# Patient Record
Sex: Male | Born: 1935 | Race: White | Hispanic: No | Marital: Married | Smoking: Former smoker
Health system: Southern US, Community
[De-identification: ages and names within clinical notes are randomized; demographics above are authoritative.]

## PROBLEM LIST (undated history)

## (undated) DIAGNOSIS — N400 Enlarged prostate without lower urinary tract symptoms: Secondary | ICD-10-CM

## (undated) DIAGNOSIS — I442 Atrioventricular block, complete: Secondary | ICD-10-CM

## (undated) DIAGNOSIS — E785 Hyperlipidemia, unspecified: Secondary | ICD-10-CM

## (undated) DIAGNOSIS — R972 Elevated prostate specific antigen [PSA]: Secondary | ICD-10-CM

## (undated) DIAGNOSIS — Z950 Presence of cardiac pacemaker: Secondary | ICD-10-CM

## (undated) DIAGNOSIS — I10 Essential (primary) hypertension: Secondary | ICD-10-CM

## (undated) DIAGNOSIS — I480 Paroxysmal atrial fibrillation: Secondary | ICD-10-CM

## (undated) DIAGNOSIS — J449 Chronic obstructive pulmonary disease, unspecified: Secondary | ICD-10-CM

## (undated) DIAGNOSIS — I509 Heart failure, unspecified: Secondary | ICD-10-CM

## (undated) DIAGNOSIS — I453 Trifascicular block: Secondary | ICD-10-CM

## (undated) HISTORY — DX: Trifascicular block: I45.3

## (undated) HISTORY — DX: Benign prostatic hyperplasia without lower urinary tract symptoms: N40.0

## (undated) HISTORY — DX: Paroxysmal atrial fibrillation: I48.0

## (undated) HISTORY — PX: CATARACT EXTRACTION, BILATERAL: SHX1313

## (undated) HISTORY — DX: Presence of cardiac pacemaker: Z95.0

## (undated) HISTORY — DX: Hyperlipidemia, unspecified: E78.5

## (undated) HISTORY — PX: TONSILLECTOMY: SUR1361

## (undated) HISTORY — DX: Atrioventricular block, complete: I44.2

## (undated) HISTORY — DX: Essential (primary) hypertension: I10

## (undated) HISTORY — DX: Elevated prostate specific antigen (PSA): R97.20

---

## 1998-07-31 ENCOUNTER — Encounter: Admission: RE | Admit: 1998-07-31 | Discharge: 1998-10-29 | Payer: Self-pay | Admitting: Internal Medicine

## 2004-04-20 ENCOUNTER — Ambulatory Visit: Payer: Self-pay | Admitting: Internal Medicine

## 2004-08-17 ENCOUNTER — Ambulatory Visit: Payer: Self-pay | Admitting: Internal Medicine

## 2004-08-27 ENCOUNTER — Ambulatory Visit: Payer: Self-pay

## 2004-11-19 ENCOUNTER — Ambulatory Visit: Payer: Self-pay | Admitting: Internal Medicine

## 2005-03-11 ENCOUNTER — Ambulatory Visit: Payer: Self-pay | Admitting: Internal Medicine

## 2005-04-29 ENCOUNTER — Ambulatory Visit: Payer: Self-pay | Admitting: Internal Medicine

## 2005-09-16 ENCOUNTER — Ambulatory Visit: Payer: Self-pay | Admitting: Internal Medicine

## 2005-09-23 ENCOUNTER — Ambulatory Visit: Payer: Self-pay | Admitting: Internal Medicine

## 2005-10-22 ENCOUNTER — Ambulatory Visit: Payer: Self-pay | Admitting: Gastroenterology

## 2006-01-27 ENCOUNTER — Ambulatory Visit: Payer: Self-pay | Admitting: Internal Medicine

## 2006-06-03 ENCOUNTER — Ambulatory Visit: Payer: Self-pay | Admitting: Internal Medicine

## 2006-10-02 ENCOUNTER — Ambulatory Visit: Payer: Self-pay | Admitting: Internal Medicine

## 2006-12-24 ENCOUNTER — Ambulatory Visit: Payer: Self-pay | Admitting: Internal Medicine

## 2006-12-24 DIAGNOSIS — I10 Essential (primary) hypertension: Secondary | ICD-10-CM

## 2006-12-24 DIAGNOSIS — Z87898 Personal history of other specified conditions: Secondary | ICD-10-CM

## 2006-12-24 DIAGNOSIS — H409 Unspecified glaucoma: Secondary | ICD-10-CM | POA: Insufficient documentation

## 2006-12-24 DIAGNOSIS — E0842 Diabetes mellitus due to underlying condition with diabetic polyneuropathy: Secondary | ICD-10-CM | POA: Insufficient documentation

## 2006-12-24 LAB — CONVERTED CEMR LAB
Basophils Relative: 0.5 % (ref 0.0–1.0)
Bilirubin, Direct: 0.1 mg/dL (ref 0.0–0.3)
CO2: 29 meq/L (ref 19–32)
Creatinine, Ser: 0.9 mg/dL (ref 0.4–1.5)
Eosinophils Relative: 5.1 % — ABNORMAL HIGH (ref 0.0–5.0)
GFR calc Af Amer: 105 mL/min
Glucose, Bld: 142 mg/dL — ABNORMAL HIGH (ref 70–99)
HCT: 42.6 % (ref 39.0–52.0)
HDL: 32.1 mg/dL — ABNORMAL LOW (ref >39.0)
Hemoglobin: 14.4 g/dL (ref 13.0–17.0)
Lymphocytes Relative: 33.9 % (ref 12.0–46.0)
Microalb Creat Ratio: 10.8 mg/g (ref 0.0–30.0)
Microalb, Ur: 2.9 mg/dL — ABNORMAL HIGH (ref 0.0–1.9)
Monocytes Absolute: 0.9 10*3/uL — ABNORMAL HIGH (ref 0.2–0.7)
Monocytes Relative: 9.4 % (ref 3.0–11.0)
Neutro Abs: 5.2 10*3/uL (ref 1.4–7.7)
Neutrophils Relative %: 51.1 % (ref 43.0–77.0)
Potassium: 3.7 meq/L (ref 3.5–5.1)
Sodium: 138 meq/L (ref 135–145)
Total Bilirubin: 1 mg/dL (ref 0.3–1.2)
Total Protein: 6.8 g/dL (ref 6.0–8.3)
VLDL: 27 mg/dL (ref 0–40)
WBC: 10.1 10*3/uL (ref 4.5–10.5)

## 2007-01-06 ENCOUNTER — Encounter (INDEPENDENT_AMBULATORY_CARE_PROVIDER_SITE_OTHER): Payer: Self-pay

## 2007-01-07 ENCOUNTER — Ambulatory Visit: Payer: Self-pay | Admitting: Internal Medicine

## 2007-01-07 DIAGNOSIS — E785 Hyperlipidemia, unspecified: Secondary | ICD-10-CM

## 2007-04-14 ENCOUNTER — Ambulatory Visit: Payer: Self-pay | Admitting: Internal Medicine

## 2007-08-14 ENCOUNTER — Ambulatory Visit: Payer: Self-pay | Admitting: Internal Medicine

## 2008-01-01 ENCOUNTER — Ambulatory Visit: Payer: Self-pay | Admitting: Internal Medicine

## 2008-01-01 LAB — CONVERTED CEMR LAB
Alkaline Phosphatase: 35 units/L — ABNORMAL LOW (ref 39–117)
Basophils Absolute: 0 10*3/uL (ref 0.0–0.1)
Bilirubin Urine: NEGATIVE
Bilirubin, Direct: 0.1 mg/dL (ref 0.0–0.3)
Blood in Urine, dipstick: NEGATIVE
Calcium: 9.7 mg/dL (ref 8.4–10.5)
Cholesterol: 124 mg/dL (ref 0–200)
Eosinophils Absolute: 0.5 10*3/uL (ref 0.0–0.7)
GFR calc Af Amer: 105 mL/min
GFR calc non Af Amer: 87 mL/min
HCT: 44.2 % (ref 39.0–52.0)
HDL: 32.5 mg/dL — ABNORMAL LOW (ref >39.0)
Hemoglobin: 14.4 g/dL (ref 13.0–17.0)
Hgb A1c MFr Bld: 7.1 % — ABNORMAL HIGH (ref 4.6–6.0)
Ketones, urine, test strip: NEGATIVE
LDL Cholesterol: 68 mg/dL (ref 0–99)
MCHC: 32.7 g/dL (ref 30.0–36.0)
Microalb Creat Ratio: 9.7 mg/g (ref 0.0–30.0)
Microalb, Ur: 1.2 mg/dL (ref 0.0–1.9)
Monocytes Absolute: 0.8 10*3/uL (ref 0.1–1.0)
Monocytes Relative: 8.1 % (ref 3.0–12.0)
Neutro Abs: 4.4 10*3/uL (ref 1.4–7.7)
Nitrite: NEGATIVE
PSA: 4.9 ng/mL — ABNORMAL HIGH (ref 0.10–4.00)
Platelets: 182 10*3/uL (ref 150–400)
Potassium: 4.1 meq/L (ref 3.5–5.1)
RDW: 13.1 % (ref 11.5–14.6)
Sodium: 140 meq/L (ref 135–145)
Specific Gravity, Urine: 1.02
TSH: 0.94 microintl units/mL (ref 0.35–5.50)
Total Bilirubin: 0.8 mg/dL (ref 0.3–1.2)
Total CHOL/HDL Ratio: 3.8
Total Protein: 6.9 g/dL (ref 6.0–8.3)
Triglycerides: 120 mg/dL (ref 0–149)
Urobilinogen, UA: 0.2

## 2008-01-12 ENCOUNTER — Encounter: Payer: Self-pay | Admitting: Internal Medicine

## 2008-01-13 ENCOUNTER — Ambulatory Visit: Payer: Self-pay | Admitting: Internal Medicine

## 2008-01-13 DIAGNOSIS — N400 Enlarged prostate without lower urinary tract symptoms: Secondary | ICD-10-CM

## 2008-04-04 ENCOUNTER — Ambulatory Visit: Payer: Self-pay | Admitting: Internal Medicine

## 2008-04-04 LAB — CONVERTED CEMR LAB: Hgb A1c MFr Bld: 7.1 % — ABNORMAL HIGH (ref 4.6–6.0)

## 2008-07-04 ENCOUNTER — Ambulatory Visit: Payer: Self-pay | Admitting: Internal Medicine

## 2008-08-16 ENCOUNTER — Encounter: Payer: Self-pay | Admitting: Internal Medicine

## 2008-10-11 ENCOUNTER — Ambulatory Visit: Payer: Self-pay | Admitting: Internal Medicine

## 2008-10-11 DIAGNOSIS — J441 Chronic obstructive pulmonary disease with (acute) exacerbation: Secondary | ICD-10-CM

## 2008-10-11 LAB — CONVERTED CEMR LAB: Hgb A1c MFr Bld: 6.9 % — ABNORMAL HIGH (ref 4.6–6.5)

## 2009-01-10 ENCOUNTER — Ambulatory Visit: Payer: Self-pay | Admitting: Internal Medicine

## 2009-01-10 DIAGNOSIS — R972 Elevated prostate specific antigen [PSA]: Secondary | ICD-10-CM

## 2009-01-12 ENCOUNTER — Encounter: Payer: Self-pay | Admitting: Internal Medicine

## 2009-01-12 LAB — CONVERTED CEMR LAB
Hgb A1c MFr Bld: 6.9 % — ABNORMAL HIGH (ref 4.6–6.5)
PSA: 5.62 ng/mL — ABNORMAL HIGH (ref 0.10–4.00)

## 2009-02-28 ENCOUNTER — Telehealth: Payer: Self-pay | Admitting: Internal Medicine

## 2009-03-01 ENCOUNTER — Encounter: Payer: Self-pay | Admitting: Internal Medicine

## 2009-03-08 ENCOUNTER — Encounter: Payer: Self-pay | Admitting: Internal Medicine

## 2009-04-05 ENCOUNTER — Ambulatory Visit: Payer: Self-pay | Admitting: Internal Medicine

## 2009-04-05 LAB — CONVERTED CEMR LAB
Blood in Urine, dipstick: NEGATIVE
Ketones, urine, test strip: NEGATIVE
Nitrite: NEGATIVE
Urobilinogen, UA: 0.2

## 2009-04-06 LAB — CONVERTED CEMR LAB
ALT: 15 units/L (ref 0–53)
Alkaline Phosphatase: 38 units/L — ABNORMAL LOW (ref 39–117)
BUN: 13 mg/dL (ref 6–23)
Basophils Relative: 0.4 % (ref 0.0–3.0)
Bilirubin, Direct: 0.1 mg/dL (ref 0.0–0.3)
Calcium: 9.4 mg/dL (ref 8.4–10.5)
Chloride: 101 meq/L (ref 96–112)
Cholesterol: 114 mg/dL (ref 0–200)
Creatinine, Ser: 0.9 mg/dL (ref 0.4–1.5)
Eosinophils Absolute: 0.4 10*3/uL (ref 0.0–0.7)
Eosinophils Relative: 4.6 % (ref 0.0–5.0)
HDL: 31.7 mg/dL — ABNORMAL LOW (ref >39.00)
Hgb A1c MFr Bld: 6.5 % (ref 4.6–6.5)
LDL Cholesterol: 59 mg/dL (ref 0–99)
Lymphocytes Relative: 37.7 % (ref 12.0–46.0)
MCV: 95.6 fL (ref 78.0–100.0)
Microalb Creat Ratio: 18 mg/g (ref 0.0–30.0)
Microalb, Ur: 3.7 mg/dL — ABNORMAL HIGH (ref 0.0–1.9)
Monocytes Absolute: 0.7 10*3/uL (ref 0.1–1.0)
Neutrophils Relative %: 48.4 % (ref 43.0–77.0)
PSA: 6.31 ng/mL — ABNORMAL HIGH (ref 0.10–4.00)
Platelets: 163 10*3/uL (ref 150.0–400.0)
RBC: 4.58 M/uL (ref 4.22–5.81)
Total Bilirubin: 1 mg/dL (ref 0.3–1.2)
Total CHOL/HDL Ratio: 4
Total Protein: 6.5 g/dL (ref 6.0–8.3)
Triglycerides: 118 mg/dL (ref 0.0–149.0)
VLDL: 23.6 mg/dL (ref 0.0–40.0)
WBC: 7.7 10*3/uL (ref 4.5–10.5)

## 2009-04-12 ENCOUNTER — Ambulatory Visit: Payer: Self-pay | Admitting: Internal Medicine

## 2009-07-13 ENCOUNTER — Ambulatory Visit: Payer: Self-pay | Admitting: Internal Medicine

## 2009-07-13 LAB — CONVERTED CEMR LAB: Blood Glucose, Fingerstick: 193

## 2009-08-30 ENCOUNTER — Encounter: Payer: Self-pay | Admitting: Internal Medicine

## 2009-10-09 ENCOUNTER — Telehealth: Payer: Self-pay | Admitting: Internal Medicine

## 2009-10-12 ENCOUNTER — Ambulatory Visit: Payer: Self-pay | Admitting: Internal Medicine

## 2010-01-02 ENCOUNTER — Encounter: Payer: Self-pay | Admitting: Internal Medicine

## 2010-01-11 ENCOUNTER — Ambulatory Visit: Payer: Self-pay | Admitting: Internal Medicine

## 2010-03-14 ENCOUNTER — Encounter: Payer: Self-pay | Admitting: Internal Medicine

## 2010-04-13 ENCOUNTER — Ambulatory Visit: Payer: Self-pay | Admitting: Internal Medicine

## 2010-07-13 ENCOUNTER — Ambulatory Visit
Admission: RE | Admit: 2010-07-13 | Discharge: 2010-07-13 | Payer: Self-pay | Source: Home / Self Care | Attending: Internal Medicine | Admitting: Internal Medicine

## 2010-07-13 ENCOUNTER — Other Ambulatory Visit: Payer: Self-pay | Admitting: Internal Medicine

## 2010-07-13 LAB — HEMOGLOBIN A1C: Hgb A1c MFr Bld: 7 % — ABNORMAL HIGH (ref 4.6–6.5)

## 2010-07-19 NOTE — Assessment & Plan Note (Signed)
Summary: [name] 3 MTHS / RS   Vital Signs:  Patient profile:   77 year old male Weight:      207 pounds Temp:     98.6 degrees F oral BP sitting:   120 / 58  (left arm) Cuff size:   regular  Vitals Entered By: Raechel Ache, RN (July 13, 2009 10:03 AM) CC: 3 mo ROV. Is Patient Diabetic? Yes CBG Result 193   CC:  3 mo ROV.Kyle [name]  History of Present Illness:  77 year old patient who is seen today for follow-up of his hypertension, type 2 diabetes.  He has a history of BPH and elevated PSA.  He is scheduled for urology follow-up in a couple of months.  He has treated dyslipidemia.  No concerns or complaints today.  He denies any cardiopulmonary complaints.  His last hemoglobin A1c was 6.5.  Allergies: No Known Drug Allergies  Past History:  Past Medical History: Reviewed history from 10/11/2008 and no changes required. Diabetes mellitus, type II Hypertension Hyperlipidemia glaucoma Benign prostatic hypertrophy elevated PSA tobacco use  Review of Systems  The patient denies anorexia, fever, weight loss, weight gain, vision loss, decreased hearing, hoarseness, chest pain, syncope, dyspnea on exertion, peripheral edema, prolonged cough, headaches, hemoptysis, abdominal pain, melena, hematochezia, severe indigestion/heartburn, hematuria, incontinence, genital sores, muscle weakness, suspicious skin lesions, transient blindness, difficulty walking, depression, unusual weight change, abnormal bleeding, enlarged lymph nodes, angioedema, breast masses, and testicular masses.    Physical Exam  General:  overweight-appearing.  low normal blood pressureoverweight-appearing.   Head:  Normocephalic and atraumatic without obvious abnormalities. No apparent alopecia or balding. Eyes:  No corneal or conjunctival inflammation noted. EOMI. Perrla. Funduscopic exam benign, without hemorrhages, exudates or papilledema. Vision grossly normal. Mouth:  Oral mucosa and oropharynx without lesions or  exudates.  Teeth in good repair. Neck:  No deformities, masses, or tenderness noted. Lungs:  Normal respiratory effort, chest expands symmetrically. Lungs are clear to auscultation, no crackles or wheezes. Heart:  Normal rate and regular rhythm. S1 and S2 normal without gallop, murmur, click, rub or other extra sounds. Abdomen:  Bowel sounds positive,abdomen soft and non-tender without masses, organomegaly or hernias noted. Msk:  No deformity or scoliosis noted of thoracic or lumbar spine.   Pulses:  R and L carotid,radial,femoral,dorsalis pedis and posterior tibial pulses are full and equal bilaterally Extremities:  No clubbing, cyanosis, edema, or deformity noted with normal full range of motion of all joints.     Impression & Recommendations:  Problem # 1:  PROSTATE SPECIFIC ANTIGEN, ELEVATED (ICD-790.93) follow-up urology in two months  Problem # 2:  HYPERLIPIDEMIA (ICD-272.4)  His updated medication list for this problem includes:    Zocor 40 Mg Tabs (Simvastatin) .Kyle [name]... 1 once daily  His updated medication list for this problem includes:    Zocor 40 Mg Tabs (Simvastatin) .Kyle [name]... 1 once daily  Problem # 3:  HYPERTENSION (ICD-401.9)  His updated medication list for this problem includes:    Lisinopril 40 Mg Tabs (Lisinopril) .Kyle [name]... 1 once daily    Norvasc 10 Mg Tabs (Amlodipine besylate) .Kyle [name]... 1 once daily    Doxazosin Mesylate 2 Mg Tabs (Doxazosin mesylate) ..... One daily    Hydrochlorothiazide 25 Mg Tabs (Hydrochlorothiazide) .Kyle [name]... 1 once daily  His updated medication list for this problem includes:    Lisinopril 40 Mg Tabs (Lisinopril) .Kyle [name]... 1 once daily    Norvasc 10 Mg Tabs (Amlodipine besylate) .Kyle [name]... 1 once daily    Doxazosin Mesylate 2 Mg  Tabs (Doxazosin mesylate) ..... One daily    Hydrochlorothiazide 25 Mg Tabs (Hydrochlorothiazide) .Kyle [name]... 1 once daily  Problem # 4:  DIABETES MELLITUS, TYPE II (ICD-250.00)  His updated medication list for this problem includes:     Lisinopril 40 Mg Tabs (Lisinopril) .Kyle [name]... 1 once daily    Glipizide 10 Mg Tb24 (Glipizide) .Kyle [name]... 1 once daily    Glucophage 1000 Mg Tabs (Metformin hcl) .Kyle [name]... 1 two times a day    Adult Aspirin Low Strength 81 Mg Tbdp (Aspirin) .Kyle [name]... 1 once daily  Orders: Capillary Blood Glucose/CBG (16109) Venipuncture (60454) TLB-A1C / Hgb A1C (Glycohemoglobin) (83036-A1C)  His updated medication list for this problem includes:    Lisinopril 40 Mg Tabs (Lisinopril) .Kyle [name]... 1 once daily    Glipizide 10 Mg Tb24 (Glipizide) .Kyle [name]... 1 once daily    Glucophage 1000 Mg Tabs (Metformin hcl) .Kyle [name]... 1 two times a day    Adult Aspirin Low Strength 81 Mg Tbdp (Aspirin) .Kyle [name]... 1 once daily  Complete Medication List: 1)  Lisinopril 40 Mg Tabs (Lisinopril) .Kyle [name].. 1 once daily 2)  Glipizide 10 Mg Tb24 (Glipizide) .Kyle [name].. 1 once daily 3)  Zocor 40 Mg Tabs (Simvastatin) .Kyle [name].. 1 once daily 4)  Glucophage 1000 Mg Tabs (Metformin hcl) .Kyle [name].. 1 two times a day 5)  Norvasc 10 Mg Tabs (Amlodipine besylate) .Kyle [name].. 1 once daily 6)  Adult Aspirin Low Strength 81 Mg Tbdp (Aspirin) .Kyle [name].. 1 once daily 7)  Naproxen 500 Mg Tabs (Naproxen) .Kyle [name].. 1 two times a day as needed 8)  Doxazosin Mesylate 2 Mg Tabs (Doxazosin mesylate) .... One daily 9)  Hydrochlorothiazide 25 Mg Tabs (Hydrochlorothiazide) .Kyle [name].. 1 once daily  Patient Instructions: 1)  Please schedule a follow-up appointment in 3 months. 2)  It is important that you exercise regularly at least 20 minutes 5 times a week. If you develop chest pain, have severe difficulty breathing, or feel very tired , stop exercising immediately and seek medical attention. 3)  You need to lose weight. Consider a lower calorie diet and regular exercise.  4)  Check your blood sugars regularly. If your readings are usually above : or below 70 you should contact our office. 5)  It is important that your Diabetic A1c level is checked every 3 months. 6)  See your eye doctor yearly to check for diabetic eye  damage. 7)  urology follow-up as planned Prescriptions: ZOCOR 40 MG  TABS (SIMVASTATIN) 1 once daily  #90 x 6   Entered and Authorized by:   Gordy Savers  MD   Signed by:   Gordy Savers  MD on 07/13/2009   Method used:   Print then Give to Patient   RxID:   [id number] LISINOPRIL 40 MG  TABS (LISINOPRIL) 1 once daily  #90 x 4   Entered and Authorized by:   Gordy Savers  MD   Signed by:   Gordy Savers  MD on 07/13/2009   Method used:   Print then Give to Patient   RxID:   [id number]

## 2010-07-19 NOTE — Assessment & Plan Note (Signed)
Summary: ROV 82MO/CB   Vital Signs:  Patient profile:   77 year old male Weight:      197 pounds Temp:     97.9 degrees F oral BP sitting:   144 / 68  (left arm) Cuff size:   regular  Vitals Entered By: Alfred Levins, CMA (April 13, 2010 10:55 AM) CC: f/u   CC:  f/u.  History of Present Illness: 77 year old patient who is seen today for follow-up.  Is followed closely by urology due to an elevated PSA.  He has a history of COPD and ongoing tobacco use.  He has type 2 diabetes, which has been stable.  He has treated hypertension.  He is on simvastatin for dyslipidemia.  He will be switched to Lipitor when it becomes generic.  He had a recent eye exam in July  Current Medications (verified): 1)  Lisinopril 40 Mg  Tabs (Lisinopril) .Marland Kitchen.. 1 Once Daily 2)  Glipizide 10 Mg  Tb24 (Glipizide) .Marland Kitchen.. 1 Once Daily 3)  Glucophage 1000 Mg  Tabs (Metformin Hcl) .Marland Kitchen.. 1 Two Times A Day 4)  Norvasc 10 Mg  Tabs (Amlodipine Besylate) .Marland Kitchen.. 1 Once Daily 5)  Naproxen 500 Mg  Tabs (Naproxen) .Marland Kitchen.. 1 Two Times A Day As Needed 6)  Doxazosin Mesylate 2 Mg Tabs (Doxazosin Mesylate) .... One Daily 7)  Hydrochlorothiazide 25 Mg Tabs (Hydrochlorothiazide) .Marland Kitchen.. 1 Once Daily 8)  Aspirin 325 Mg Tabs (Aspirin) .... 1/2 Qd 9)  Simvastatin 20 Mg Tabs (Simvastatin) .Marland Kitchen.. 1 By Mouth At Bedtime  Allergies (verified): No Known Drug Allergies  Past History:  Past Medical History: Reviewed history from 10/11/2008 and no changes required. Diabetes mellitus, type II Hypertension Hyperlipidemia glaucoma Benign prostatic hypertrophy elevated PSA tobacco use  Past Surgical History: Reviewed history from 01/13/2008 and no changes required. Tonsillectomy sigmoidoscopy 2001  Negative Cardiolite stress test March 2006  Family History: Reviewed history from 04/12/2009 and no changes required. Family History of Prostate CA 1st degree relative <50 Family History Other cancer-Breast Fam hx MI father died of  congestive heart failure with a history of prostate cancer Mother died age 55 Cardiac dysrrythmia  Social History: Reviewed history from 12/24/2006 and no changes required. Current Smoker Retired Married considering moving to a retirement center  Review of Systems  The patient denies anorexia, fever, weight loss, weight gain, vision loss, decreased hearing, hoarseness, chest pain, syncope, dyspnea on exertion, peripheral edema, prolonged cough, headaches, hemoptysis, abdominal pain, melena, hematochezia, severe indigestion/heartburn, hematuria, incontinence, genital sores, muscle weakness, suspicious skin lesions, transient blindness, difficulty walking, depression, unusual weight change, abnormal bleeding, enlarged lymph nodes, angioedema, breast masses, and testicular masses.    Physical Exam  General:  overweight-appearing.  normal blood pressureoverweight-appearing.   Head:  Normocephalic and atraumatic without obvious abnormalities. No apparent alopecia or balding. Eyes:  No corneal or conjunctival inflammation noted. EOMI. Perrla. Funduscopic exam benign, without hemorrhages, exudates or papilledema. Vision grossly normal. Mouth:  Oral mucosa and oropharynx without lesions or exudates.  Teeth in good repair. Neck:  No deformities, masses, or tenderness noted. Lungs:  bilateral rhonchi Heart:  Normal rate and regular rhythm. S1 and S2 normal without gallop, murmur, click, rub or other extra sounds. Abdomen:  Bowel sounds positive,abdomen soft and non-tender without masses, organomegaly or hernias noted. Msk:  No deformity or scoliosis noted of thoracic or lumbar spine.   Pulses:  R and L carotid,radial,femoral,dorsalis pedis and posterior tibial pulses are full and equal bilaterally  Diabetes Management Exam:    Eye  Exam:       Eye Exam done elsewhere          Date: 01/02/2010          Results: normal          Done by: ophthalmology   Impression & Recommendations:  Problem #  1:  COPD (ICD-496) cessation of smoking.  Encouraged  Problem # 2:  HYPERLIPIDEMIA (ICD-272.4)  His updated medication list for this problem includes:    Simvastatin 20 Mg Tabs (Simvastatin) .Marland Kitchen... 1 by mouth at bedtime will switch to Lipitor, when it becomes generically available  His updated medication list for this problem includes:    Simvastatin 20 Mg Tabs (Simvastatin) .Marland Kitchen... 1 by mouth at bedtime  Problem # 3:  HYPERTENSION (ICD-401.9)  His updated medication list for this problem includes:    Lisinopril 40 Mg Tabs (Lisinopril) .Marland Kitchen... 1 once daily    Norvasc 10 Mg Tabs (Amlodipine besylate) .Marland Kitchen... 1 once daily    Doxazosin Mesylate 2 Mg Tabs (Doxazosin mesylate) ..... One daily    Hydrochlorothiazide 25 Mg Tabs (Hydrochlorothiazide) .Marland Kitchen... 1 once daily  His updated medication list for this problem includes:    Lisinopril 40 Mg Tabs (Lisinopril) .Marland Kitchen... 1 once daily    Norvasc 10 Mg Tabs (Amlodipine besylate) .Marland Kitchen... 1 once daily    Doxazosin Mesylate 2 Mg Tabs (Doxazosin mesylate) ..... One daily    Hydrochlorothiazide 25 Mg Tabs (Hydrochlorothiazide) .Marland Kitchen... 1 once daily  Problem # 4:  DIABETES MELLITUS, TYPE II (ICD-250.00)  His updated medication list for this problem includes:    Lisinopril 40 Mg Tabs (Lisinopril) .Marland Kitchen... 1 once daily    Glipizide 10 Mg Tb24 (Glipizide) .Marland Kitchen... 1 once daily    Glucophage 1000 Mg Tabs (Metformin hcl) .Marland Kitchen... 1 two times a day    Aspirin 325 Mg Tabs (Aspirin) .Marland Kitchen... 1/2 qd  Orders: Venipuncture (16109) TLB-A1C / Hgb A1C (Glycohemoglobin) (83036-A1C) Specimen Handling (60454)  Complete Medication List: 1)  Lisinopril 40 Mg Tabs (Lisinopril) .Marland Kitchen.. 1 once daily 2)  Glipizide 10 Mg Tb24 (Glipizide) .Marland Kitchen.. 1 once daily 3)  Glucophage 1000 Mg Tabs (Metformin hcl) .Marland Kitchen.. 1 two times a day 4)  Norvasc 10 Mg Tabs (Amlodipine besylate) .Marland Kitchen.. 1 once daily 5)  Naproxen 500 Mg Tabs (Naproxen) .Marland Kitchen.. 1 two times a day as needed 6)  Doxazosin Mesylate 2 Mg Tabs  (Doxazosin mesylate) .... One daily 7)  Hydrochlorothiazide 25 Mg Tabs (Hydrochlorothiazide) .Marland Kitchen.. 1 once daily 8)  Aspirin 325 Mg Tabs (Aspirin) .... 1/2 qd 9)  Simvastatin 20 Mg Tabs (Simvastatin) .Marland Kitchen.. 1 by mouth at bedtime 10)  Lorazepam 0.5 Mg Tabs (Lorazepam) .... One twice daily as needed for anxiety  Patient Instructions: 1)  Please schedule a follow-up appointment in 3 months. 2)  Limit your Sodium (Salt) to less than 2 grams a day(slightly less than 1/2 a teaspoon) to prevent fluid retention, swelling, or worsening of symptoms. 3)  It is important that you exercise regularly at least 20 minutes 5 times a week. If you develop chest pain, have severe difficulty breathing, or feel very tired , stop exercising immediately and seek medical attention. 4)  You need to lose weight. Consider a lower calorie diet and regular exercise.  5)  Check your blood sugars regularly. If your readings are usually above : or below 70 you should contact our office. 6)  It is important that your Diabetic A1c level is checked every 3 months. Prescriptions: LORAZEPAM 0.5 MG TABS (LORAZEPAM) one twice  daily as needed for anxiety  #50 x 0   Entered and Authorized by:   Gordy Savers  MD   Signed by:   Gordy Savers  MD on 04/13/2010   Method used:   Print then Give to Patient   RxID:   [id number] HYDROCHLOROTHIAZIDE 25 MG TABS (HYDROCHLOROTHIAZIDE) 1 once daily  #90 x 3   Entered and Authorized by:   Gordy Savers  MD   Signed by:   Gordy Savers  MD on 04/13/2010   Method used:   Electronically to        [facility]* (retail)       [address]       [place], [place]  [id number]       Ph: [phone]       Fax: [phone]   RxID:   [id number] DOXAZOSIN MESYLATE 2 MG TABS (DOXAZOSIN MESYLATE) one daily  #90 x 4   Entered and Authorized by:   Gordy Savers  MD   Signed by:   Gordy Savers  MD on 04/13/2010   Method used:   Electronically to          [facility]* (retail)       [address]       [place], [place]  [address]       Ph: [phone]       Fax: [phone]   RxID:   [id number] NORVASC 10 MG  TABS (AMLODIPINE BESYLATE) 1 once daily  #90 x 6   Entered and Authorized by:   Gordy Savers  MD   Signed by:   Gordy Savers  MD on 04/13/2010   Method used:   Electronically to        [facility]* (retail)       [address]       [place], [place]  [address]       Ph: [phone]       Fax: [phone]   RxID:   [id number] GLUCOPHAGE 1000 MG  TABS (METFORMIN HCL) 1 two times a day  #180 x 6   Entered and Authorized by:   Gordy Savers  MD   Signed by:   Gordy Savers  MD on 04/13/2010   Method used:   Electronically to        [facility]* (retail)       [address]       [place], [place]  [address]       Ph: [phone]       Fax: [phone]   RxID:   [id number] GLIPIZIDE 10 MG  TB24 (GLIPIZIDE) 1 once daily  #90 x 6   Entered and Authorized by:   Gordy Savers  MD   Signed by:   Gordy Savers  MD on 04/13/2010   Method used:   Electronically to        [facility]* (retail)       [address]       [place], [place]  [address]       Ph: [phone]       Fax: [phone]   RxID:   [id number] LISINOPRIL 40 MG  TABS (LISINOPRIL) 1 once daily  #90 x 4   Entered and Authorized by:   Gordy Savers  MD   Signed by:   Gordy Savers  MD on 04/13/2010   Method used:  Electronically to        [facility]* (retail)       [address]       [place], [place]  [address]       Ph: [phone]       Fax: [phone]   RxID:   [id number]    Orders Added: 1)  Est. Patient Level IV [24401] 2)  Venipuncture [36415] 3)  TLB-A1C / Hgb A1C (Glycohemoglobin) [83036-A1C] 4)  Specimen Handling [99000]

## 2010-07-19 NOTE — Letter (Signed)
Summary: [facility]  [facility]   Imported By: Maryln Gottron 01/08/2010 11:15:51  _____________________________________________________________________  External Attachment:    Type:   Image     Comment:   External Document

## 2010-07-19 NOTE — Letter (Signed)
Summary: [facility]  [facility]   Imported By: Maryln Gottron 09/07/2009 14:50:55  _____________________________________________________________________  External Attachment:    Type:   Image     Comment:   External Document

## 2010-07-19 NOTE — Letter (Signed)
Summary: [facility]  [facility]   Imported By: Maryln Gottron 03/21/2010 13:52:49  _____________________________________________________________________  External Attachment:    Type:   Image     Comment:   External Document

## 2010-07-19 NOTE — Progress Notes (Signed)
Summary: refill hydrochlorathiazide  Phone Note Refill Request Message from:  Fax from Pharmacy on October 09, 2009 9:55 AM  Refills Requested: Medication #1:  HYDROCHLOROTHIAZIDE 25 MG TABS 1 once daily. [facility]    [phone]    [phone]   Method Requested: Fax to Local Pharmacy Initial call taken by: Duard Brady LPN,  October 09, 2009 9:56 AM    Prescriptions: HYDROCHLOROTHIAZIDE 25 MG TABS (HYDROCHLOROTHIAZIDE) 1 once daily  #100 x 3   Entered by:   Duard Brady LPN   Authorized by:   Gordy Savers  MD   Signed by:   Duard Brady LPN on 25/36/6440   Method used:   Faxed to ...       [facility]* (retail)       [address]       [place], [place]  [address]       Ph: [phone]       Fax: [phone]   RxID:   [id number]  faxed to [facility]

## 2010-07-19 NOTE — Assessment & Plan Note (Signed)
Summary: 3 month fup/cjr   Vital Signs:  Patient profile:   77 year old male Weight:      195 pounds Temp:     98.0 degrees F oral BP sitting:   112 / 70  (left arm) Cuff size:   regular  Vitals Entered By: Duard Brady LPN (July 13, 2010 10:26 AM) CC: 3 mos rov - doing well Is Patient Diabetic? Yes Did you bring your meter with you today? No CBG Result 245   CC:  3 mos rov - doing well.  History of Present Illness: 77 year old patient seen today for follow-up.  He has a history of COPD, dyslipidemia, hypertension, and type 2 diabetes.  Hemoglobin A1c is inconsistently then around 7.  He is on 20 mg of simvastatin and also is on amlodipine.  He denies any cardiopulmonary complaints.  Apparently, he was seen at the [facility] and was given a prescription for albuterol  to wheezing.  Allergies (verified): No Known Drug Allergies  Past History:  Past Medical History: Reviewed history from 10/11/2008 and no changes required. Diabetes mellitus, type II Hypertension Hyperlipidemia glaucoma Benign prostatic hypertrophy elevated PSA tobacco use  Past Surgical History: Reviewed history from 01/13/2008 and no changes required. Tonsillectomy sigmoidoscopy 2001  Negative Cardiolite stress test March 2006  Social History: Reviewed history from 04/13/2010 and no changes required. Current Smoker Retired Married considering moving to a retirement center  Review of Systems  The patient denies anorexia, fever, weight loss, weight gain, vision loss, decreased hearing, hoarseness, chest pain, syncope, dyspnea on exertion, peripheral edema, prolonged cough, headaches, hemoptysis, abdominal pain, melena, hematochezia, severe indigestion/heartburn, hematuria, incontinence, genital sores, muscle weakness, suspicious skin lesions, transient blindness, difficulty walking, depression, unusual weight change, abnormal bleeding, enlarged lymph nodes, angioedema, breast masses, and  testicular masses.    Physical Exam  General:  overweight-appearing.  120/70overweight-appearing.   Head:  Normocephalic and atraumatic without obvious abnormalities. No apparent alopecia or balding. Eyes:  No corneal or conjunctival inflammation noted. EOMI. Perrla. Funduscopic exam benign, without hemorrhages, exudates or papilledema. Vision grossly normal. Mouth:  Oral mucosa and oropharynx without lesions or exudates.  Teeth in good repair. Neck:  No deformities, masses, or tenderness noted. Lungs:  bibasilar rales, but no active wheezing Heart:  Normal rate and regular rhythm. S1 and S2 normal without gallop, murmur, click, rub or other extra sounds. Abdomen:  Bowel sounds positive,abdomen soft and non-tender without masses, organomegaly or hernias noted. Msk:  No deformity or scoliosis noted of thoracic or lumbar spine.   Extremities:  No clubbing, cyanosis, edema, or deformity noted with normal full range of motion of all joints.   Skin:  Intact without suspicious lesions or rashes Cervical Nodes:  No lymphadenopathy noted   Impression & Recommendations:  Problem # 1:  COPD (ICD-496)  Problem # 2:  HYPERLIPIDEMIA (ICD-272.4)  His updated medication list for this problem includes:    Simvastatin 20 Mg Tabs (Simvastatin) .Marland Kitchen... 1 by mouth at bedtime  His updated medication list for this problem includes:    Simvastatin 20 Mg Tabs (Simvastatin) .Marland Kitchen... 1 by mouth at bedtime  Problem # 3:  HYPERTENSION (ICD-401.9)  His updated medication list for this problem includes:    Lisinopril 40 Mg Tabs (Lisinopril) .Marland Kitchen... 1 once daily    Norvasc 10 Mg Tabs (Amlodipine besylate) .Marland Kitchen... 1 once daily    Doxazosin Mesylate 2 Mg Tabs (Doxazosin mesylate) ..... One daily    Hydrochlorothiazide 25 Mg Tabs (Hydrochlorothiazide) .Marland KitchenMarland KitchenMarland KitchenMarland Kitchen 1  once daily  His updated medication list for this problem includes:    Lisinopril 40 Mg Tabs (Lisinopril) .Marland Kitchen... 1 once daily    Norvasc 10 Mg Tabs (Amlodipine  besylate) .Marland Kitchen... 1 once daily    Doxazosin Mesylate 2 Mg Tabs (Doxazosin mesylate) ..... One daily    Hydrochlorothiazide 25 Mg Tabs (Hydrochlorothiazide) .Marland Kitchen... 1 once daily  Problem # 4:  DIABETES MELLITUS, TYPE II (ICD-250.00)  His updated medication list for this problem includes:    Lisinopril 40 Mg Tabs (Lisinopril) .Marland Kitchen... 1 once daily    Glipizide 10 Mg Tb24 (Glipizide) .Marland Kitchen... 1 once daily    Glucophage 1000 Mg Tabs (Metformin hcl) .Marland Kitchen... 1 two times a day    Aspirin 325 Mg Tabs (Aspirin) .Marland Kitchen... 1/2 qd  Orders: Capillary Blood Glucose/CBG (16109)  His updated medication list for this problem includes:    Lisinopril 40 Mg Tabs (Lisinopril) .Marland Kitchen... 1 once daily    Glipizide 10 Mg Tb24 (Glipizide) .Marland Kitchen... 1 once daily    Glucophage 1000 Mg Tabs (Metformin hcl) .Marland Kitchen... 1 two times a day    Aspirin 325 Mg Tabs (Aspirin) .Marland Kitchen... 1/2 qd  Complete Medication List: 1)  Lisinopril 40 Mg Tabs (Lisinopril) .Marland Kitchen.. 1 once daily 2)  Glipizide 10 Mg Tb24 (Glipizide) .Marland Kitchen.. 1 once daily 3)  Glucophage 1000 Mg Tabs (Metformin hcl) .Marland Kitchen.. 1 two times a day 4)  Norvasc 10 Mg Tabs (Amlodipine besylate) .Marland Kitchen.. 1 once daily 5)  Naproxen 500 Mg Tabs (Naproxen) .Marland Kitchen.. 1 two times a day as needed 6)  Doxazosin Mesylate 2 Mg Tabs (Doxazosin mesylate) .... One daily 7)  Hydrochlorothiazide 25 Mg Tabs (Hydrochlorothiazide) .Marland Kitchen.. 1 once daily 8)  Aspirin 325 Mg Tabs (Aspirin) .... 1/2 qd 9)  Simvastatin 20 Mg Tabs (Simvastatin) .Marland Kitchen.. 1 by mouth at bedtime 10)  Lorazepam 0.5 Mg Tabs (Lorazepam) .... One twice daily as needed for anxiety  Other Orders: Venipuncture (60454) TLB-A1C / Hgb A1C (Glycohemoglobin) (83036-A1C)  Patient Instructions: 1)  Please schedule a follow-up appointment in 3 months. 2)  Limit your Sodium (Salt). 3)  It is important that you exercise regularly at least 20 minutes 5 times a week. If you develop chest pain, have severe difficulty breathing, or feel very tired , stop exercising immediately and seek  medical attention. 4)  You need to lose weight. Consider a lower calorie diet and regular exercise.  5)  Check your blood sugars regularly. If your readings are usually above : or below 70 you should contact our office. 6)  It is important that your Diabetic A1c level is checked every 3 months. 7)  See your eye doctor yearly to check for diabetic eye damage.   Orders Added: 1)  Capillary Blood Glucose/CBG [82948] 2)  Est. Patient Level IV [09811] 3)  Venipuncture [36415] 4)  TLB-A1C / Hgb A1C (Glycohemoglobin) [83036-A1C]  Appended Document: Orders Update    Clinical Lists Changes  Orders: Added new Service order of Specimen Handling (91478) - Signed

## 2010-07-19 NOTE — Assessment & Plan Note (Signed)
Summary: 3 MONTH ROV/NJR   Vital Signs:  [name] profile:   77 year old male Weight:      206 pounds Temp:     97.6 degrees F oral BP sitting:   128 / 58  (right arm) Cuff size:   regular  Vitals Entered By: Duard Brady LPN (October 12, 2009 10:41 AM) CC: 3 month ROV  pt doing well   Is [name] Diabetic? Yes CBG Result 163   CC:  3 month ROV  pt doing well  .  History of Present Illness:  Kyle [name] who is seen today for follow-up.  He has a history of hypertension, type 2 diabetes, ongoing tobacco use, BPH, and elevated PSA.  He has COPD.  he is followed by urology.  He has done quite well and denies any cardiopulmonary complaints.  He has had a recent urology visit last month.  His ulnar status has been stable.  His hemoglobin A1c has trended up.  It was 6.5 in October and more recently, was 7.4 in January.  He states that he smokes just under one pack per day  Preventive Screening-Counseling & Management  Alcohol-Tobacco     Smoking Status: current     Smoking Cessation Counseling: yes  Allergies (verified): No Known Drug Allergies  Past History:  Past Medical History: Reviewed history from 10/11/2008 and no changes required. Diabetes mellitus, type II Hypertension Hyperlipidemia glaucoma Benign prostatic hypertrophy elevated PSA tobacco use  Past Surgical History: Reviewed history from 01/13/2008 and no changes required. Tonsillectomy sigmoidoscopy 2001  Negative Cardiolite stress test March 2006  Review of Systems       The [name] complains of dyspnea on exertion.  The [name] denies anorexia, fever, weight loss, weight gain, vision loss, decreased hearing, hoarseness, chest pain, syncope, peripheral edema, prolonged cough, headaches, hemoptysis, abdominal pain, melena, hematochezia, severe indigestion/heartburn, hematuria, incontinence, genital sores, muscle weakness, suspicious skin lesions, transient blindness, difficulty walking, depression,  unusual weight change, abnormal bleeding, enlarged lymph nodes, angioedema, breast masses, and testicular masses.    Physical Exam  General:  overweight-appearing.  low-normal blood pressureoverweight-appearing.   Head:  Normocephalic and atraumatic without obvious abnormalities. No apparent alopecia or balding. Eyes:  No corneal or conjunctival inflammation noted. EOMI. Perrla. Funduscopic exam benign, without hemorrhages, exudates or papilledema. Vision grossly normal. Mouth:  Oral mucosa and oropharynx without lesions or exudates.  Teeth in good repair. Neck:  No deformities, masses, or tenderness noted. Lungs:  Normal respiratory effort, chest expands symmetrically. Lungs are clear to auscultation, no crackles or wheezes. Heart:  Normal rate and regular rhythm. S1 and S2 normal without gallop, murmur, click, rub or other extra sounds. Abdomen:  Bowel sounds positive,abdomen soft and non-tender without masses, organomegaly or hernias noted. Msk:  No deformity or scoliosis noted of thoracic or lumbar spine.   Pulses:  R and L carotid,radial,femoral,dorsalis pedis and posterior tibial pulses are full and equal bilaterally Extremities:  No clubbing, cyanosis, edema, or deformity noted with normal full range of motion of all joints.   Skin:  Intact without suspicious lesions or rashes   Impression & Recommendations:  Problem # 1:  COPD (ICD-496) stable.  Cessation of smoking.  Encouraged  Problem # 2:  HYPERLIPIDEMIA (ICD-272.4)  His updated medication list for this problem includes:    Zocor 40 Mg Tabs (Simvastatin) .Marland Kitchen... 1 once daily  His updated medication list for this problem includes:    Zocor 40 Mg Tabs (Simvastatin) .Marland Kitchen... 1 once daily  Problem #  3:  HYPERTENSION (ICD-401.9)  His updated medication list for this problem includes:    Lisinopril 40 Mg Tabs (Lisinopril) .Marland Kitchen... 1 once daily    Norvasc 10 Mg Tabs (Amlodipine besylate) .Marland Kitchen... 1 once daily    Doxazosin Mesylate 2 Mg  Tabs (Doxazosin mesylate) ..... One daily    Hydrochlorothiazide 25 Mg Tabs (Hydrochlorothiazide) .Marland Kitchen... 1 once daily    His updated medication list for this problem includes:    Lisinopril 40 Mg Tabs (Lisinopril) .Marland Kitchen... 1 once daily    Norvasc 10 Mg Tabs (Amlodipine besylate) .Marland Kitchen... 1 once daily    Doxazosin Mesylate 2 Mg Tabs (Doxazosin mesylate) ..... One daily    Hydrochlorothiazide 25 Mg Tabs (Hydrochlorothiazide) .Marland Kitchen... 1 once daily  Problem # 4:  DIABETES MELLITUS, TYPE II (ICD-250.00)  His updated medication list for this problem includes:    Lisinopril 40 Mg Tabs (Lisinopril) .Marland Kitchen... 1 once daily    Glipizide 10 Mg Tb24 (Glipizide) .Marland Kitchen... 1 once daily    Glucophage 1000 Mg Tabs (Metformin hcl) .Marland Kitchen... 1 two times a day    Adult Aspirin Low Strength 81 Mg Tbdp (Aspirin) .Marland Kitchen... 1 once daily  Orders: Capillary Blood Glucose/CBG (16109)  His updated medication list for this problem includes:    Lisinopril 40 Mg Tabs (Lisinopril) .Marland Kitchen... 1 once daily    Glipizide 10 Mg Tb24 (Glipizide) .Marland Kitchen... 1 once daily    Glucophage 1000 Mg Tabs (Metformin hcl) .Marland Kitchen... 1 two times a day    Adult Aspirin Low Strength 81 Mg Tbdp (Aspirin) .Marland Kitchen... 1 once daily  Complete Medication List: 1)  Lisinopril 40 Mg Tabs (Lisinopril) .Marland Kitchen.. 1 once daily 2)  Glipizide 10 Mg Tb24 (Glipizide) .Marland Kitchen.. 1 once daily 3)  Zocor 40 Mg Tabs (Simvastatin) .Marland Kitchen.. 1 once daily 4)  Glucophage 1000 Mg Tabs (Metformin hcl) .Marland Kitchen.. 1 two times a day 5)  Norvasc 10 Mg Tabs (Amlodipine besylate) .Marland Kitchen.. 1 once daily 6)  Adult Aspirin Low Strength 81 Mg Tbdp (Aspirin) .Marland Kitchen.. 1 once daily 7)  Naproxen 500 Mg Tabs (Naproxen) .Marland Kitchen.. 1 two times a day as needed 8)  Doxazosin Mesylate 2 Mg Tabs (Doxazosin mesylate) .... One daily 9)  Hydrochlorothiazide 25 Mg Tabs (Hydrochlorothiazide) .Marland Kitchen.. 1 once daily  Other Orders: Venipuncture (60454) TLB-A1C / Hgb A1C (Glycohemoglobin) (83036-A1C) Prescription Created Electronically [id number])  [name]  Instructions: 1)  Please schedule a follow-up appointment in 3 months. 2)  Limit your Sodium (Salt). 3)  Tobacco is very bad for your health and your loved ones! You Should stop smoking!. 4)  It is important that you exercise regularly at least 20 minutes 5 times a week. If you develop chest pain, have severe difficulty breathing, or feel very tired , stop exercising immediately and seek medical attention. 5)  You need to lose weight. Consider a lower calorie diet and regular exercise.  6)  Check your blood sugars regularly. If your readings are usually above : or below 70 you should contact our office. 7)  It is important that your Diabetic A1c level is checked every 3 months. 8)  See your eye doctor yearly to check for diabetic eye damage. Prescriptions: HYDROCHLOROTHIAZIDE 25 MG TABS (HYDROCHLOROTHIAZIDE) 1 once daily  #90 x 3   Entered and Authorized by:   Gordy Savers  MD   Signed by:   Gordy Savers  MD on 10/12/2009   Method used:   Electronically to        [facility]* (retail)  [address]       [place], [place]  [address]       Ph: [phone]       Fax: [phone]   RxID:   [id number] DOXAZOSIN MESYLATE 2 MG TABS (DOXAZOSIN MESYLATE) one daily  #90 x 4   Entered and Authorized by:   Gordy Savers  MD   Signed by:   Gordy Savers  MD on 10/12/2009   Method used:   Electronically to        [facility]* (retail)       [address]       [place], [place]  [id number]       Ph: [phone]       Fax: [phone]   RxID:   [id number] NORVASC 10 MG  TABS (AMLODIPINE BESYLATE) 1 once daily  #90 x 6   Entered and Authorized by:   Gordy Savers  MD   Signed by:   Gordy Savers  MD on 10/12/2009   Method used:   Electronically to        [facility]* (retail)       [address]       [place], [place]  [address]       Ph: [phone]       Fax: [phone]   RxID:    [id number] GLUCOPHAGE 1000 MG  TABS (METFORMIN HCL) 1 two times a day  #180 x 6   Entered and Authorized by:   Gordy Savers  MD   Signed by:   Gordy Savers  MD on 10/12/2009   Method used:   Electronically to        [facility]* (retail)       [address]       [place], [place]  [address]       Ph: [phone]       Fax: [phone]   RxID:   [id number] ZOCOR 40 MG  TABS (SIMVASTATIN) 1 once daily  #90 x 6   Entered and Authorized by:   Gordy Savers  MD   Signed by:   Gordy Savers  MD on 10/12/2009   Method used:   Electronically to        [facility]* (retail)       [address]       [place], [place]  [address]       Ph: [phone]       Fax: [phone]   RxID:   [id number] GLIPIZIDE 10 MG  TB24 (GLIPIZIDE) 1 once daily  #90 x 6   Entered and Authorized by:   Gordy Savers  MD   Signed by:   Gordy Savers  MD on 10/12/2009   Method used:   Electronically to        [facility]* (retail)       [address]       [place], [place]  [address]       Ph: [phone]       Fax: [phone]   RxID:   [id number] LISINOPRIL 40 MG  TABS (LISINOPRIL) 1 once daily  #90 x 4   Entered and Authorized by:   Gordy Savers  MD   Signed by:   Gordy Savers  MD on 10/12/2009   Method used:   Electronically to        [facility]* (retail)  [address]       [place], [place]  [address]       Ph: [phone]       Fax: [phone]   RxID:   [id number]

## 2010-07-19 NOTE — Assessment & Plan Note (Signed)
Summary: 3 month fup//ccm   Vital Signs:  Patient profile:   77 year old male Weight:      207 pounds Temp:     98.3 degrees F oral BP sitting:   122 / 70  (right arm) Cuff size:   regular  Vitals Entered By: Duard Brady LPN (January 11, 2010 10:18 AM) CC: 3 mos rov- doing well     fbs 119 Is Patient Diabetic? Yes Did you bring your meter with you today? No   CC:  3 mos rov- doing well     fbs 119.  History of Present Illness: 77 year old patient who is seen today for follow-up of his type 2 diabetes.  He has a history of hypertension and hyperlipidemia.  Present medical regimen includes simvastatin 40 mg daily, and also amlodipine for blood pressure control.  He is tolerating a simvastatin, without signs of myopathy.  His last enrolled A1c was improved.  A fasting blood sugar today 119.  Unfortunately, no weight improvement.  His blood pressure remained stable.  Allergies (verified): No Known Drug Allergies  Past History:  Past Medical History: Reviewed history from 10/11/2008 and no changes required. Diabetes mellitus, type II Hypertension Hyperlipidemia glaucoma Benign prostatic hypertrophy elevated PSA tobacco use  Past Surgical History: Reviewed history from 01/13/2008 and no changes required. Tonsillectomy sigmoidoscopy 2001  Negative Cardiolite stress test March 2006  Physical Exam  General:  overweight-appearing.  110/70overweight-appearing.   Head:  Normocephalic and atraumatic without obvious abnormalities. No apparent alopecia or balding. Eyes:  No corneal or conjunctival inflammation noted. EOMI. Perrla. Funduscopic exam benign, without hemorrhages, exudates or papilledema. Vision grossly normal. Mouth:  Oral mucosa and oropharynx without lesions or exudates.  Teeth in good repair. Neck:  No deformities, masses, or tenderness noted. Lungs:  Normal respiratory effort, chest expands symmetrically. Lungs are clear to auscultation, no crackles or  wheezes. Heart:  Normal rate and regular rhythm. S1 and S2 normal without gallop, murmur, click, rub or other extra sounds. Abdomen:  Bowel sounds positive,abdomen soft and non-tender without masses, organomegaly or hernias noted. Msk:  No deformity or scoliosis noted of thoracic or lumbar spine.   Extremities:  No clubbing, cyanosis, edema, or deformity noted with normal full range of motion of all joints.    Diabetes Management Exam:    Foot Exam (with socks and/or shoes not present):       Sensory-Pinprick/Light touch:          Left medial foot (L-4): normal          Left dorsal foot (L-5): normal          Left lateral foot (S-1): normal          Right medial foot (L-4): normal          Right dorsal foot (L-5): normal          Right lateral foot (S-1): normal       Sensory-Monofilament:          Left foot: normal          Right foot: normal       Inspection:          Left foot: normal          Right foot: normal       Nails:          Left foot: thickened          Right foot: thickened    Foot Exam by Podiatrist:  Date: 01/11/2010       Results: no diabetic findings       Done by: PCP    Eye Exam:       Eye Exam done here today          Results: normal   Impression & Recommendations:  Problem # 1:  HYPERLIPIDEMIA (ICD-272.4)  The following medications were removed from the medication list:    Zocor 40 Mg Tabs (Simvastatin) .Marland Kitchen... 1 once daily His updated medication list for this problem includes:    Lipitor 20 Mg Tabs (Atorvastatin calcium) ..... One daily in view of the simvastatin amlodipine, interaction will change to Lipitor  The following medications were removed from the medication list:    Zocor 40 Mg Tabs (Simvastatin) .Marland Kitchen... 1 once daily His updated medication list for this problem includes:    Lipitor 20 Mg Tabs (Atorvastatin calcium) ..... One daily  Orders: Specimen Handling (04540)  Problem # 2:  HYPERTENSION (ICD-401.9)  His updated medication  list for this problem includes:    Lisinopril 40 Mg Tabs (Lisinopril) .Marland Kitchen... 1 once daily    Norvasc 10 Mg Tabs (Amlodipine besylate) .Marland Kitchen... 1 once daily    Doxazosin Mesylate 2 Mg Tabs (Doxazosin mesylate) ..... One daily    Hydrochlorothiazide 25 Mg Tabs (Hydrochlorothiazide) .Marland Kitchen... 1 once daily  Orders: Specimen Handling (98119)  Problem # 3:  DIABETES MELLITUS, TYPE II (ICD-250.00)  The following medications were removed from the medication list:    Adult Aspirin Low Strength 81 Mg Tbdp (Aspirin) .Marland Kitchen... 1 once daily His updated medication list for this problem includes:    Lisinopril 40 Mg Tabs (Lisinopril) .Marland Kitchen... 1 once daily    Glipizide 10 Mg Tb24 (Glipizide) .Marland Kitchen... 1 once daily    Glucophage 1000 Mg Tabs (Metformin hcl) .Marland Kitchen... 1 two times a day    Aspirin 325 Mg Tabs (Aspirin) .Marland Kitchen... 1/2 qd    The following medications were removed from the medication list:    Adult Aspirin Low Strength 81 Mg Tbdp (Aspirin) .Marland Kitchen... 1 once daily His updated medication list for this problem includes:    Lisinopril 40 Mg Tabs (Lisinopril) .Marland Kitchen... 1 once daily    Glipizide 10 Mg Tb24 (Glipizide) .Marland Kitchen... 1 once daily    Glucophage 1000 Mg Tabs (Metformin hcl) .Marland Kitchen... 1 two times a day    Aspirin 325 Mg Tabs (Aspirin) .Marland Kitchen... 1/2 qd  Orders: Venipuncture (14782) TLB-A1C / Hgb A1C (Glycohemoglobin) (83036-A1C) Specimen Handling (95621)  Complete Medication List: 1)  Lisinopril 40 Mg Tabs (Lisinopril) .Marland Kitchen.. 1 once daily 2)  Glipizide 10 Mg Tb24 (Glipizide) .Marland Kitchen.. 1 once daily 3)  Glucophage 1000 Mg Tabs (Metformin hcl) .Marland Kitchen.. 1 two times a day 4)  Norvasc 10 Mg Tabs (Amlodipine besylate) .Marland Kitchen.. 1 once daily 5)  Naproxen 500 Mg Tabs (Naproxen) .Marland Kitchen.. 1 two times a day as needed 6)  Doxazosin Mesylate 2 Mg Tabs (Doxazosin mesylate) .... One daily 7)  Hydrochlorothiazide 25 Mg Tabs (Hydrochlorothiazide) .Marland Kitchen.. 1 once daily 8)  Aspirin 325 Mg Tabs (Aspirin) .... 1/2 qd 9)  Lipitor 20 Mg Tabs (Atorvastatin calcium) .... One  daily  Patient Instructions: 1)  Please schedule a follow-up appointment in 3 months. 2)  Limit your Sodium (Salt). 3)  It is important that you exercise regularly at least 20 minutes 5 times a week. If you develop chest pain, have severe difficulty breathing, or feel very tired , stop exercising immediately and seek medical attention. 4)  You need to lose weight. Consider  a lower calorie diet and regular exercise.  5)  Check your blood sugars regularly. If your readings are usually above : or below 70 you should contact our office. 6)  It is important that your Diabetic A1c level is checked every 3 months. 7)  See your eye doctor yearly to check for diabetic eye damage. Prescriptions: LIPITOR 20 MG TABS (ATORVASTATIN CALCIUM) one daily  #90 x 6   Entered and Authorized by:   Gordy Savers  MD   Signed by:   Gordy Savers  MD on 01/11/2010   Method used:   Electronically to        [facility]* (retail)       [address]       [place], [place]  [address]       Ph: [phone]       Fax: [phone]   RxID:   [id number]

## 2010-10-11 ENCOUNTER — Encounter: Payer: Self-pay | Admitting: Internal Medicine

## 2010-10-12 ENCOUNTER — Ambulatory Visit (INDEPENDENT_AMBULATORY_CARE_PROVIDER_SITE_OTHER): Payer: Medicare Other | Admitting: Internal Medicine

## 2010-10-12 ENCOUNTER — Encounter: Payer: Self-pay | Admitting: Internal Medicine

## 2010-10-12 DIAGNOSIS — E785 Hyperlipidemia, unspecified: Secondary | ICD-10-CM

## 2010-10-12 DIAGNOSIS — E119 Type 2 diabetes mellitus without complications: Secondary | ICD-10-CM

## 2010-10-12 DIAGNOSIS — J4489 Other specified chronic obstructive pulmonary disease: Secondary | ICD-10-CM

## 2010-10-12 DIAGNOSIS — J449 Chronic obstructive pulmonary disease, unspecified: Secondary | ICD-10-CM

## 2010-10-12 DIAGNOSIS — I10 Essential (primary) hypertension: Secondary | ICD-10-CM

## 2010-10-12 LAB — HEMOGLOBIN A1C: Hgb A1c MFr Bld: 7 % — ABNORMAL HIGH (ref 4.6–6.5)

## 2010-10-12 LAB — GLUCOSE, POCT (MANUAL RESULT ENTRY): POC Glucose: 245

## 2010-10-12 NOTE — Progress Notes (Signed)
  Subjective:    Patient ID: Kyle [name], male    DOB: February 10, 1926, 77 y.o.   MRN: [id number]  HPI  77 year old patient who is seen today for followup of his type 2 diabetes. He is maintained nice glycemic control there's been no real weight loss or significant effort at dietary maneuvers. Hemoglobin A1c's have generally been fairly stable around 7.0. He has noted no change in his home glycemia control. He has a history of COPD and ongoing tobacco use he is still smoking he says less than one pack per day. Denies any wheezing or shortness of breath He has treated hypertension and dyslipidemia. This remains stable. He denies any cardiopulmonary complaints    Review of Systems  Constitutional: Negative for fever, chills, appetite change and fatigue.  HENT: Negative for hearing loss, ear pain, congestion, sore throat, trouble swallowing, neck stiffness, dental problem, voice change and tinnitus.   Eyes: Negative for pain, discharge and visual disturbance.  Respiratory: Negative for cough, chest tightness, wheezing and stridor.   Cardiovascular: Negative for chest pain, palpitations and leg swelling.  Gastrointestinal: Negative for nausea, vomiting, abdominal pain, diarrhea, constipation, blood in stool and abdominal distention.  Genitourinary: Negative for urgency, hematuria, flank pain, discharge, difficulty urinating and genital sores.  Musculoskeletal: Negative for myalgias, back pain, joint swelling, arthralgias and gait problem.  Skin: Negative for rash.  Neurological: Negative for dizziness, syncope, speech difficulty, weakness, numbness and headaches.  Hematological: Negative for adenopathy. Does not bruise/bleed easily.  Psychiatric/Behavioral: Negative for behavioral problems and dysphoric mood. The patient is not nervous/anxious.        Objective:   Physical Exam  Constitutional: He is oriented to person, place, and time. He appears well-developed and well-nourished.   Moderately overweight. No distress blood pressure well controlled.  HENT:  Head: Normocephalic.  Right Ear: External ear normal.  Left Ear: External ear normal.  Eyes: Conjunctivae and EOM are normal.  Neck: Normal range of motion.  Cardiovascular: Normal rate and normal heart sounds.   Pulmonary/Chest: Effort normal. He has rales.       Normal breathing effort. An extensive rales involving both bases the right much more prominent. O2 saturation 95% pulse rate 58  Abdominal: Bowel sounds are normal.  Musculoskeletal: Normal range of motion. He exhibits no edema and no tenderness.  Neurological: He is alert and oriented to person, place, and time.  Psychiatric: He has a normal mood and affect. His behavior is normal.          Assessment & Plan:   Diabetes mellitus. We'll check a hemoglobin A1c better diet weight loss all encouraged COPD and ongoing tobacco use Dyslipidemia Hypertension well controlled

## 2010-10-12 NOTE — Patient Instructions (Signed)
This.lows    It is important that you exercise regularly, at least 20 minutes 3 to 4 times per week.  If you develop chest pain or shortness of breath seek  medical attention.  You need to lose weight.  Consider a lower calorie diet and regular exercise.   Please check your hemoglobin A1c every 3 months  Smoking tobacco is very bad for your health. You should stop smoking immediately.

## 2011-01-09 ENCOUNTER — Encounter: Payer: Self-pay | Admitting: Internal Medicine

## 2011-01-09 ENCOUNTER — Ambulatory Visit (INDEPENDENT_AMBULATORY_CARE_PROVIDER_SITE_OTHER): Payer: Medicare Other | Admitting: Internal Medicine

## 2011-01-09 DIAGNOSIS — J449 Chronic obstructive pulmonary disease, unspecified: Secondary | ICD-10-CM

## 2011-01-09 DIAGNOSIS — E119 Type 2 diabetes mellitus without complications: Secondary | ICD-10-CM

## 2011-01-09 DIAGNOSIS — I10 Essential (primary) hypertension: Secondary | ICD-10-CM

## 2011-01-09 NOTE — Progress Notes (Signed)
  Subjective:    Patient ID: Kyle [name], male    DOB: [birth year], 77 y.o.   MRN: [id number]  HPI  77 year old patient who is seen today for followup he has diabetes hypertension and COPD. He is doing quite well; he has maintained nice glycemic control and his last hemoglobin A1c was 7.0. His pulmonary status has been stable. No concerns or complaints.    Review of Systems  Constitutional: Negative for fever, chills, appetite change and fatigue.  HENT: Negative for hearing loss, ear pain, congestion, sore throat, trouble swallowing, neck stiffness, dental problem, voice change and tinnitus.   Eyes: Negative for pain, discharge and visual disturbance.  Respiratory: Negative for cough, chest tightness, wheezing and stridor.   Cardiovascular: Negative for chest pain, palpitations and leg swelling.  Gastrointestinal: Negative for nausea, vomiting, abdominal pain, diarrhea, constipation, blood in stool and abdominal distention.  Genitourinary: Negative for urgency, hematuria, flank pain, discharge, difficulty urinating and genital sores.  Musculoskeletal: Negative for myalgias, back pain, joint swelling, arthralgias and gait problem.  Skin: Negative for rash.  Neurological: Negative for dizziness, syncope, speech difficulty, weakness, numbness and headaches.  Hematological: Negative for adenopathy. Does not bruise/bleed easily.  Psychiatric/Behavioral: Negative for behavioral problems and dysphoric mood. The patient is not nervous/anxious.        Objective:   Physical Exam  Constitutional: He is oriented to person, place, and time. He appears well-developed.  HENT:  Head: Normocephalic.  Right Ear: External ear normal.  Left Ear: External ear normal.  Eyes: Conjunctivae and EOM are normal.  Neck: Normal range of motion.  Cardiovascular: Normal rate and normal heart sounds.   Pulmonary/Chest: Effort normal and breath sounds normal.       Few coarse rhonchi at the bases. O2 saturation  normal  Abdominal: Bowel sounds are normal.  Musculoskeletal: Normal range of motion. He exhibits no edema and no tenderness.  Neurological: He is alert and oriented to person, place, and time.  Psychiatric: He has a normal mood and affect. His behavior is normal.          Assessment & Plan:   Hypertension stable Diabetes mellitus stable. We'll check a hemoglobin A1c  Recheck in 3 months

## 2011-01-09 NOTE — Patient Instructions (Signed)
Please check your hemoglobin A1c every 3 months  Limit your sodium (Salt) intake   

## 2011-01-11 ENCOUNTER — Ambulatory Visit: Payer: Medicare Other | Admitting: Internal Medicine

## 2011-01-14 ENCOUNTER — Other Ambulatory Visit: Payer: Self-pay

## 2011-01-14 MED ORDER — LISINOPRIL 40 MG PO TABS: 40.0000 mg | ORAL_TABLET | Freq: Every day | ORAL | Status: DC

## 2011-01-14 NOTE — Telephone Encounter (Signed)
rx sent to pharmacy

## 2011-01-30 ENCOUNTER — Other Ambulatory Visit: Payer: Self-pay

## 2011-01-30 MED ORDER — LORAZEPAM 0.5 MG PO TABS: 0.5000 mg | ORAL_TABLET | Freq: Two times a day (BID) | ORAL | Status: DC | PRN

## 2011-01-30 NOTE — Telephone Encounter (Signed)
Faxed back to [facility] 

## 2011-03-22 ENCOUNTER — Other Ambulatory Visit (INDEPENDENT_AMBULATORY_CARE_PROVIDER_SITE_OTHER): Payer: Medicare Other

## 2011-03-22 DIAGNOSIS — Z79899 Other long term (current) drug therapy: Secondary | ICD-10-CM

## 2011-03-22 DIAGNOSIS — I10 Essential (primary) hypertension: Secondary | ICD-10-CM

## 2011-03-22 DIAGNOSIS — Z125 Encounter for screening for malignant neoplasm of prostate: Secondary | ICD-10-CM

## 2011-03-22 DIAGNOSIS — Z0000 Encounter for general adult medical examination without abnormal findings: Secondary | ICD-10-CM

## 2011-03-22 DIAGNOSIS — E119 Type 2 diabetes mellitus without complications: Secondary | ICD-10-CM

## 2011-03-22 DIAGNOSIS — E785 Hyperlipidemia, unspecified: Secondary | ICD-10-CM

## 2011-03-22 LAB — TSH: TSH: 0.97 u[IU]/mL (ref 0.35–5.50)

## 2011-03-22 LAB — CBC WITH DIFFERENTIAL/PLATELET
Basophils Absolute: 0.1 10*3/uL (ref 0.0–0.1)
Eosinophils Absolute: 0.4 10*3/uL (ref 0.0–0.7)
Lymphocytes Relative: 37.5 % (ref 12.0–46.0)
MCHC: 32.3 g/dL (ref 30.0–36.0)
MCV: 94.5 fl (ref 78.0–100.0)
Monocytes Absolute: 0.8 10*3/uL (ref 0.1–1.0)
Neutrophils Relative %: 47.5 % (ref 43.0–77.0)
Platelets: 163 10*3/uL (ref 150.0–400.0)

## 2011-03-22 LAB — LIPID PANEL
Cholesterol: 83 mg/dL (ref 0–200)
HDL: 34.9 mg/dL — ABNORMAL LOW (ref >39.00)
LDL Cholesterol: 36 mg/dL (ref 0–99)
Triglycerides: 62 mg/dL (ref 0.0–149.0)
VLDL: 12.4 mg/dL (ref 0.0–40.0)

## 2011-03-22 LAB — BASIC METABOLIC PANEL
BUN: 18 mg/dL (ref 6–23)
CO2: 31 mEq/L (ref 19–32)
Calcium: 9.4 mg/dL (ref 8.4–10.5)
Chloride: 101 mEq/L (ref 96–112)
Creatinine, Ser: 0.8 mg/dL (ref 0.4–1.5)

## 2011-03-22 LAB — POCT URINALYSIS DIPSTICK
Bilirubin, UA: NEGATIVE
Glucose, UA: NEGATIVE
Ketones, UA: NEGATIVE
Nitrite, UA: NEGATIVE
Spec Grav, UA: 1.015

## 2011-03-22 LAB — HEPATIC FUNCTION PANEL
Bilirubin, Direct: 0.1 mg/dL (ref 0.0–0.3)
Total Bilirubin: 0.8 mg/dL (ref 0.3–1.2)

## 2011-03-22 LAB — MICROALBUMIN / CREATININE URINE RATIO: Microalb Creat Ratio: 4.5 mg/g (ref 0.0–30.0)

## 2011-04-04 ENCOUNTER — Encounter: Payer: Self-pay | Admitting: Internal Medicine

## 2011-04-04 ENCOUNTER — Ambulatory Visit (INDEPENDENT_AMBULATORY_CARE_PROVIDER_SITE_OTHER): Payer: Medicare Other | Admitting: Internal Medicine

## 2011-04-04 DIAGNOSIS — E119 Type 2 diabetes mellitus without complications: Secondary | ICD-10-CM

## 2011-04-04 DIAGNOSIS — E785 Hyperlipidemia, unspecified: Secondary | ICD-10-CM

## 2011-04-04 DIAGNOSIS — Z0000 Encounter for general adult medical examination without abnormal findings: Secondary | ICD-10-CM

## 2011-04-04 DIAGNOSIS — R972 Elevated prostate specific antigen [PSA]: Secondary | ICD-10-CM

## 2011-04-04 DIAGNOSIS — J449 Chronic obstructive pulmonary disease, unspecified: Secondary | ICD-10-CM

## 2011-04-04 DIAGNOSIS — I10 Essential (primary) hypertension: Secondary | ICD-10-CM

## 2011-04-04 DIAGNOSIS — Z23 Encounter for immunization: Secondary | ICD-10-CM

## 2011-04-04 LAB — HM DIABETES FOOT EXAM

## 2011-04-04 MED ORDER — DOXAZOSIN MESYLATE 4 MG PO TABS: 4.0000 mg | ORAL_TABLET | Freq: Every day | ORAL | Status: DC

## 2011-04-04 MED ORDER — SERTRALINE HCL 25 MG PO TABS: 25.0000 mg | ORAL_TABLET | Freq: Every day | ORAL | Status: DC

## 2011-04-04 MED ORDER — DOXAZOSIN MESYLATE 2 MG PO TABS: 4.0000 mg | ORAL_TABLET | Freq: Every day | ORAL | Status: DC

## 2011-04-04 NOTE — Patient Instructions (Signed)
Smoking tobacco is very bad for your health. You should stop smoking immediately.  Limit your sodium (Salt) intake   Please check your hemoglobin A1c every 3 months  You need to lose weight.  Consider a lower calorie diet and regular exercise.

## 2011-04-04 NOTE — Progress Notes (Signed)
Subjective:    Patient ID: Kyle [name], male    DOB: [birth year], 77 y.o.   MRN: [id number]  HPI History of Present Illness:   77 year-old patient who is seen today for a comprehensive evaluation. medical problems include trifascicular block. He denies any exertional chest pain, shortness of breath. Denies any presyncopal symptoms. He is treated dyslipidemia, hypertension, and type 2 diabetes. He is followed by urology for an elevated PSA and BPH. He is doing quite well. Today. Unfortunately, he continues to smoke modestly. He denies much in the way of exercise limitations. He states that he walks several times weekly, and mows his lawn every Saturday.   Preventive Screening-Counseling & Management  Alcohol-Tobacco  Smoking Cessation Counseling: yes   Allergies:  No Known Drug Allergies   Past History:  Past Medical History:  Reviewed history from 10/11/2008 and no changes required.  Diabetes mellitus, type II  Hypertension  Hyperlipidemia  glaucoma  Benign prostatic hypertrophy  elevated PSA  tobacco use   Past Surgical History:  Reviewed history from 01/13/2008 and no changes required.  Tonsillectomy  sigmoidoscopy 2001  Negative Cardiolite stress test March 2006   Family History:  Reviewed history from 01/07/2007 and no changes required.  Family History of Prostate CA 1st degree relative <50  Family History Other cancer-Breast  Fam hx MI  father died of congestive heart failure with a history of prostate cancer  Mother died age 66 Cardiac dysrrythmia   Social History:  Reviewed history from 12/24/2006 and no changes required.  Current Smoker  Retired  Married   1. Risk factors, based on past  M,S,F history- vascular risk factors include tobacco use hypertension dyslipidemia and diabetes  2.  Physical activities:No major limitations still mows his lawn occasionally  3.  Depression/mood:No history depression or mood disorder;   for the past 2 months has had  worsening sleep habits. He describes vivid bothersome dreams that affect his getting back to sleep. He also feels that he is mildly depressed at the present time with little interest in daily activities  4.  Hearing:No deficits  5.  ADL's:Independent in all aspects of daily living  6.  Fall risk: low   7.  Home safety:No problems identified  8.  Height weight, and visual acuity;Height and weight stable no change in visual acuity  9.  Counseling:Tobacco cessation discussed and encouraged  10. Lab orders based on risk factors:Laboratory profile including hemoglobin A1c and PSA discussed  11. Referral : Followup urology as scheduled  12. Care plan: more regular exercise modest weight loss tobacco cessation all encouraged  13. Cognitive assessment: Alert and oriented with normal affect. No cognitive dysfunction.  Past Medical History  Diagnosis Date  . Diabetes mellitus   . Hypertension   . Hyperlipidemia   . Glaucoma   . Elevated PSA   . Benign prostatic hypertrophy    Past Surgical History  Procedure Date  . Tonsillectomy     reports that he has been smoking Cigarettes.  He has been smoking about .8 packs per day. He has never used smokeless tobacco. He reports that he drinks alcohol. He reports that he does not use illicit drugs. family history includes Cancer in his father and Heart disease in his father and mother. No Known Allergies      Review of Systems  Constitutional: Negative for fever, chills, activity change, appetite change and fatigue.  HENT: Negative for hearing loss, ear pain, congestion, rhinorrhea, sneezing, mouth sores, trouble swallowing, neck  pain, neck stiffness, dental problem, voice change, sinus pressure and tinnitus.   Eyes: Negative for photophobia, pain, redness and visual disturbance.  Respiratory: Negative for apnea, cough, choking, chest tightness, shortness of breath and wheezing.   Cardiovascular: Negative for chest pain, palpitations and  leg swelling.  Gastrointestinal: Negative for nausea, vomiting, abdominal pain, diarrhea, constipation, blood in stool, abdominal distention, anal bleeding and rectal pain.  Genitourinary: Negative for dysuria, urgency, frequency, hematuria, flank pain, decreased urine volume, discharge, penile swelling, scrotal swelling, difficulty urinating, genital sores and testicular pain.  Musculoskeletal: Negative for myalgias, back pain, joint swelling, arthralgias and gait problem.  Skin: Negative for color change, rash and wound.  Neurological: Negative for dizziness, tremors, seizures, syncope, facial asymmetry, speech difficulty, weakness, light-headedness, numbness and headaches.  Hematological: Negative for adenopathy. Does not bruise/bleed easily.  Psychiatric/Behavioral: Negative for suicidal ideas, hallucinations, behavioral problems, confusion, sleep disturbance, self-injury, dysphoric mood, decreased concentration and agitation. The patient is not nervous/anxious.        Objective:   Physical Exam  Constitutional: He appears well-developed and well-nourished.  HENT:  Head: Normocephalic and atraumatic.  Right Ear: External ear normal.  Left Ear: External ear normal.  Nose: Nose normal.  Mouth/Throat: Oropharynx is clear and moist.  Eyes: Conjunctivae and EOM are normal. Pupils are equal, round, and reactive to light. No scleral icterus.  Neck: Normal range of motion. Neck supple. No JVD present. No thyromegaly present.  Cardiovascular: Regular rhythm, normal heart sounds and intact distal pulses.  Exam reveals no gallop and no friction rub.   No murmur heard.      Left supraclavicular and left femoral bruit  Pedal pulses were normal  Pulmonary/Chest: Effort normal and breath sounds normal. He exhibits no tenderness.  Abdominal: Soft. Bowel sounds are normal. He exhibits no distension and no mass. There is no tenderness.  Genitourinary: Penis normal.  Musculoskeletal: Normal range of  motion. He exhibits no edema and no tenderness.  Lymphadenopathy:    He has no cervical adenopathy.  Neurological: He is alert. He has normal reflexes. No cranial nerve deficit. Coordination normal.  Skin: Skin is warm and dry. No rash noted.  Psychiatric: He has a normal mood and affect. His behavior is normal.          Assessment & Plan:  Preventive health examination Hypertension stable BPH with increased PSA Dyslipidemia Type 2 diabetes Tobacco use  Total cessation tobacco encouraged. Modest weight loss and a more regular exercise regimen also recommended;  return in 3 months for followup

## 2011-04-22 ENCOUNTER — Other Ambulatory Visit: Payer: Self-pay | Admitting: Internal Medicine

## 2011-04-30 ENCOUNTER — Other Ambulatory Visit: Payer: Self-pay | Admitting: Internal Medicine

## 2011-05-28 ENCOUNTER — Other Ambulatory Visit: Payer: Self-pay

## 2011-05-28 MED ORDER — SIMVASTATIN 20 MG PO TABS: 20.0000 mg | ORAL_TABLET | Freq: Every day | ORAL | Status: DC

## 2011-07-23 ENCOUNTER — Other Ambulatory Visit: Payer: Self-pay | Admitting: Internal Medicine

## 2011-08-05 ENCOUNTER — Ambulatory Visit (INDEPENDENT_AMBULATORY_CARE_PROVIDER_SITE_OTHER): Payer: Medicare Other | Admitting: Internal Medicine

## 2011-08-05 ENCOUNTER — Encounter: Payer: Self-pay | Admitting: Internal Medicine

## 2011-08-05 DIAGNOSIS — E785 Hyperlipidemia, unspecified: Secondary | ICD-10-CM

## 2011-08-05 DIAGNOSIS — I10 Essential (primary) hypertension: Secondary | ICD-10-CM

## 2011-08-05 DIAGNOSIS — J449 Chronic obstructive pulmonary disease, unspecified: Secondary | ICD-10-CM

## 2011-08-05 DIAGNOSIS — J4489 Other specified chronic obstructive pulmonary disease: Secondary | ICD-10-CM

## 2011-08-05 DIAGNOSIS — E119 Type 2 diabetes mellitus without complications: Secondary | ICD-10-CM

## 2011-08-05 LAB — HEMOGLOBIN A1C: Hgb A1c MFr Bld: 6.6 % — ABNORMAL HIGH (ref 4.6–6.5)

## 2011-08-05 NOTE — Patient Instructions (Signed)
Please check your hemoglobin A1c every 3 months  Limit your sodium (Salt) intake  Smoking tobacco is very bad for your health. You should stop smoking immediately. 

## 2011-08-05 NOTE — Progress Notes (Signed)
Quick Note:  Spoke with pt - informed of lab results. KIK ______ 

## 2011-08-05 NOTE — Progress Notes (Signed)
  Subjective:    Patient ID: Kyle [name], male    DOB: 1925-08-31, 78 y.o.   MRN: [id number]  HPI  78 year old patient who is seen today for followup of his type 2 diabetes. His last hemoglobin A1c was 7.3 up from 6.7. He feels there's been some modest weight loss he has treated hypertension and also a history of dyslipidemia. He has a history of COPD and ongoing tobacco use. Fasting blood sugar today 117 blood pressure and a low normal range He denies any cardiopulmonary complaints    Review of Systems  Constitutional: Negative for fever, chills, appetite change and fatigue.  HENT: Negative for hearing loss, ear pain, congestion, sore throat, trouble swallowing, neck stiffness, dental problem, voice change and tinnitus.   Eyes: Negative for pain, discharge and visual disturbance.  Respiratory: Negative for cough, chest tightness, wheezing and stridor.   Cardiovascular: Negative for chest pain, palpitations and leg swelling.  Gastrointestinal: Negative for nausea, vomiting, abdominal pain, diarrhea, constipation, blood in stool and abdominal distention.  Genitourinary: Negative for urgency, hematuria, flank pain, discharge, difficulty urinating and genital sores.  Musculoskeletal: Negative for myalgias, back pain, joint swelling, arthralgias and gait problem.  Skin: Negative for rash.  Neurological: Negative for dizziness, syncope, speech difficulty, weakness, numbness and headaches.  Hematological: Negative for adenopathy. Does not bruise/bleed easily.  Psychiatric/Behavioral: Negative for behavioral problems and dysphoric mood. The patient is not nervous/anxious.        Objective:   Physical Exam  Constitutional: He is oriented to person, place, and time. He appears well-developed.  HENT:  Head: Normocephalic.  Right Ear: External ear normal.  Left Ear: External ear normal.  Eyes: Conjunctivae and EOM are normal.  Neck: Normal range of motion.  Cardiovascular: Normal rate and  normal heart sounds.   Pulmonary/Chest: He is in respiratory distress.       Bilateral rhonchi  Abdominal: Bowel sounds are normal.  Musculoskeletal: Normal range of motion. He exhibits no edema and no tenderness.  Neurological: He is alert and oriented to person, place, and time.  Psychiatric: He has a normal mood and affect. His behavior is normal.          Assessment & Plan:   Hypertension well controlled Type 2 diabetes. We'll check a hemoglobin A1c COPD with ongoing tobacco use. Smoking cessation encouraged Dyslipidemia

## 2011-08-07 ENCOUNTER — Other Ambulatory Visit: Payer: Self-pay | Admitting: Internal Medicine

## 2011-08-19 ENCOUNTER — Other Ambulatory Visit: Payer: Self-pay | Admitting: Internal Medicine

## 2011-08-28 DIAGNOSIS — Z0279 Encounter for issue of other medical certificate: Secondary | ICD-10-CM

## 2011-10-31 ENCOUNTER — Other Ambulatory Visit: Payer: Self-pay | Admitting: Internal Medicine

## 2011-11-05 ENCOUNTER — Encounter: Payer: Self-pay | Admitting: Internal Medicine

## 2011-11-05 ENCOUNTER — Ambulatory Visit (INDEPENDENT_AMBULATORY_CARE_PROVIDER_SITE_OTHER): Payer: Medicare Other | Admitting: Internal Medicine

## 2011-11-05 VITALS — BP 98/60 | Temp 97.8°F | Wt 196.0 lb

## 2011-11-05 DIAGNOSIS — I10 Essential (primary) hypertension: Secondary | ICD-10-CM

## 2011-11-05 DIAGNOSIS — E119 Type 2 diabetes mellitus without complications: Secondary | ICD-10-CM

## 2011-11-05 LAB — HEMOGLOBIN A1C: Hgb A1c MFr Bld: 6.7 % — ABNORMAL HIGH (ref 4.6–6.5)

## 2011-11-05 MED ORDER — DOXAZOSIN MESYLATE 4 MG PO TABS: ORAL_TABLET | ORAL | Status: DC

## 2011-11-05 MED ORDER — GLIPIZIDE 5 MG PO TABS: 5.0000 mg | ORAL_TABLET | Freq: Two times a day (BID) | ORAL | Status: DC

## 2011-11-05 NOTE — Progress Notes (Signed)
Subjective:    Patient ID: Kyle [name], male    DOB: 1925/12/05, 78 y.o.   MRN: [id number]  HPI  78 year old patient who is seen today for followup of his hypertension and diabetes both have been very tightly controlled. Last hemoglobin A1c 6.6. Fasting blood sugars are occasionally less than 100. He has treated hypertension on multiple drugs blood pressure today was quite low. Denies any orthostatic symptoms  Past Medical History  Diagnosis Date  . Diabetes mellitus   . Hypertension   . Hyperlipidemia   . Glaucoma   . Elevated PSA   . Benign prostatic hypertrophy     History   Social History  . Marital Status: Married    Spouse Name: N/A    Number of Children: N/A  . Years of Education: N/A   Occupational History  . Not on file.   Social History Main Topics  . Smoking status: Current Everyday Smoker -- 0.8 packs/day    Types: Cigarettes  . Smokeless tobacco: Never Used  . Alcohol Use: Yes  . Drug Use: No  . Sexually Active: Not on file   Other Topics Concern  . Not on file   Social History Narrative  . No narrative on file    Past Surgical History  Procedure Date  . Tonsillectomy     Family History  Problem Relation Age of Onset  . Heart disease Mother   . Heart disease Father   . Cancer Father     prostate    No Known Allergies  Current Outpatient Prescriptions on File Prior to Visit  Medication Sig Dispense Refill  . albuterol (PROVENTIL,VENTOLIN) 90 MCG/ACT inhaler Inhale 2 puffs into the lungs 2 (two) times daily.        Marland Kitchen aspirin 325 MG tablet Take 325 mg by mouth. 1/2 tab daily       . doxazosin (CARDURA) 4 MG tablet Take 1 tablet (4 mg total) by mouth daily.  30 tablet  11  . GLIPIZIDE XL 10 MG 24 hr tablet TAKE 1 TABLET ONCE DAILY.  90 each  3  . GLUCOPHAGE 1000 MG tablet TAKE 1 TABLET TWICE DAILY.  180 each  3  . hydrochlorothiazide 25 MG tablet Take 25 mg by mouth daily.        Marland Kitchen lisinopril (PRINIVIL,ZESTRIL) 40 MG tablet TAKE 1 TABLET  ONCE DAILY.  90 tablet  2  . LORazepam (ATIVAN) 0.5 MG tablet Take 1 tablet (0.5 mg total) by mouth 2 (two) times daily as needed.  50 tablet  1  . NAPROSYN 500 MG tablet TAKE (1) TABLET TWICE A DAY AS NEEDED.  60 each  1  . NORVASC 10 MG tablet TAKE 1 TABLET ONCE DAILY.  90 each  3  . Omega-3 Fatty Acids (FISH OIL) 1000 MG CAPS Take by mouth daily.        . sertraline (ZOLOFT) 25 MG tablet Take 1 tablet (25 mg total) by mouth daily.  30 tablet  2  . simvastatin (ZOCOR) 20 MG tablet Take 1 tablet (20 mg total) by mouth at bedtime.  90 tablet  3  . timolol (TIMOPTIC-XR) 0.5 % ophthalmic gel-forming         BP 98/60  Temp(Src) 97.8 F (36.6 C) (Oral)  Wt 196 lb (88.905 kg)        Review of Systems  Constitutional: Negative for fever, chills, appetite change and fatigue.  HENT: Negative for hearing loss, ear pain, congestion, sore throat, trouble swallowing,  neck stiffness, dental problem, voice change and tinnitus.   Eyes: Negative for pain, discharge and visual disturbance.  Respiratory: Negative for cough, chest tightness, wheezing and stridor.   Cardiovascular: Negative for chest pain, palpitations and leg swelling.  Gastrointestinal: Negative for nausea, vomiting, abdominal pain, diarrhea, constipation, blood in stool and abdominal distention.  Genitourinary: Negative for urgency, hematuria, flank pain, discharge, difficulty urinating and genital sores.  Musculoskeletal: Negative for myalgias, back pain, joint swelling, arthralgias and gait problem.  Skin: Negative for rash.  Neurological: Negative for dizziness, syncope, speech difficulty, weakness, numbness and headaches.  Hematological: Negative for adenopathy. Does not bruise/bleed easily.  Psychiatric/Behavioral: Negative for behavioral problems and dysphoric mood. The patient is not nervous/anxious.        Objective:   Physical Exam  Constitutional: He is oriented to person, place, and time. He appears well-developed.         Blood pressure 90/60  HENT:  Head: Normocephalic.  Right Ear: External ear normal.  Left Ear: External ear normal.  Eyes: Conjunctivae and EOM are normal.  Neck: Normal range of motion.  Cardiovascular: Normal rate and normal heart sounds.   Pulmonary/Chest: Breath sounds normal.  Abdominal: Bowel sounds are normal.  Musculoskeletal: Normal range of motion. He exhibits no edema and no tenderness.  Neurological: He is alert and oriented to person, place, and time.  Psychiatric: He has a normal mood and affect. His behavior is normal.          Assessment & Plan:  Hypertension. We'll discontinue hydrochlorothiazide Diabetes mellitus. We'll check a hemoglobin A1c. Will decrease Glucotrol to 5 mg daily   Recheck 3 months

## 2011-11-05 NOTE — Patient Instructions (Signed)
Discontinue hydrochlorothiazide Decrease doxazosin  To  one half tablet daily Decrease Glucotrol to 5 mg daily   Please check your hemoglobin A1c every 3 months

## 2012-02-05 ENCOUNTER — Ambulatory Visit (INDEPENDENT_AMBULATORY_CARE_PROVIDER_SITE_OTHER): Payer: Medicare Other | Admitting: Internal Medicine

## 2012-02-05 ENCOUNTER — Encounter: Payer: Self-pay | Admitting: Internal Medicine

## 2012-02-05 VITALS — BP 150/70 | Temp 98.2°F | Wt 194.0 lb

## 2012-02-05 DIAGNOSIS — I10 Essential (primary) hypertension: Secondary | ICD-10-CM

## 2012-02-05 DIAGNOSIS — E119 Type 2 diabetes mellitus without complications: Secondary | ICD-10-CM

## 2012-02-05 DIAGNOSIS — J449 Chronic obstructive pulmonary disease, unspecified: Secondary | ICD-10-CM

## 2012-02-05 LAB — HEMOGLOBIN A1C: Hgb A1c MFr Bld: 6.9 % — ABNORMAL HIGH (ref 4.6–6.5)

## 2012-02-05 NOTE — Patient Instructions (Signed)
Limit your sodium (Salt) intake   Please check your hemoglobin A1c every 3 months  You need to lose weight.  Consider a lower calorie diet and regular exercise. 

## 2012-02-05 NOTE — Progress Notes (Signed)
Subjective:    Patient ID: Kyle [name], male    DOB: 06-Aug-1925, 78 y.o.   MRN: [id number]  HPI  78 year old patient who is seen today for his quarterly followup. He has a history of type 2 diabetes. Medical problems also include dyslipidemia and a history of hypertension. He has a history of COPD which has been stable. No real concerns or complaints today. Fasting blood sugars he states are in the 140-150 range.  Past Medical History  Diagnosis Date  . Diabetes mellitus   . Hypertension   . Hyperlipidemia   . Glaucoma   . Elevated PSA   . Benign prostatic hypertrophy     History   Social History  . Marital Status: Married    Spouse Name: N/A    Number of Children: N/A  . Years of Education: N/A   Occupational History  . Not on file.   Social History Main Topics  . Smoking status: Current Everyday Smoker -- 0.8 packs/day    Types: Cigarettes  . Smokeless tobacco: Never Used  . Alcohol Use: Yes  . Drug Use: No  . Sexually Active: Not on file   Other Topics Concern  . Not on file   Social History Narrative  . No narrative on file    Past Surgical History  Procedure Date  . Tonsillectomy     Family History  Problem Relation Age of Onset  . Heart disease Mother   . Heart disease Father   . Cancer Father     prostate    No Known Allergies  Current Outpatient Prescriptions on File Prior to Visit  Medication Sig Dispense Refill  . albuterol (PROVENTIL,VENTOLIN) 90 MCG/ACT inhaler Inhale 2 puffs into the lungs 2 (two) times daily.        Marland Kitchen aspirin 325 MG tablet Take 325 mg by mouth. 1/2 tab daily       . doxazosin (CARDURA) 4 MG tablet One half tablet daily  30 tablet  11  . glipiZIDE (GLUCOTROL) 5 MG tablet Take 1 tablet (5 mg total) by mouth 2 (two) times daily before a meal.  60 tablet  3  . GLUCOPHAGE 1000 MG tablet TAKE 1 TABLET TWICE DAILY.  180 each  3  . lisinopril (PRINIVIL,ZESTRIL) 40 MG tablet TAKE 1 TABLET ONCE DAILY.  90 tablet  2  .  LORazepam (ATIVAN) 0.5 MG tablet Take 1 tablet (0.5 mg total) by mouth 2 (two) times daily as needed.  50 tablet  1  . NAPROSYN 500 MG tablet TAKE (1) TABLET TWICE A DAY AS NEEDED.  60 each  1  . NORVASC 10 MG tablet TAKE 1 TABLET ONCE DAILY.  90 each  3  . Omega-3 Fatty Acids (FISH OIL) 1000 MG CAPS Take by mouth daily.        . sertraline (ZOLOFT) 25 MG tablet Take 1 tablet (25 mg total) by mouth daily.  30 tablet  2  . simvastatin (ZOCOR) 20 MG tablet Take 1 tablet (20 mg total) by mouth at bedtime.  90 tablet  3  . timolol (TIMOPTIC-XR) 0.5 % ophthalmic gel-forming         BP 150/70  Temp 98.2 F (36.8 C) (Oral)  Wt 194 lb (87.998 kg)  SpO2 96%       Review of Systems  Constitutional: Negative for fever, chills, appetite change and fatigue.  HENT: Negative for hearing loss, ear pain, congestion, sore throat, trouble swallowing, neck stiffness, dental problem, voice change and  tinnitus.   Eyes: Negative for pain, discharge and visual disturbance.  Respiratory: Negative for cough, chest tightness, wheezing and stridor.   Cardiovascular: Negative for chest pain, palpitations and leg swelling.  Gastrointestinal: Negative for nausea, vomiting, abdominal pain, diarrhea, constipation, blood in stool and abdominal distention.  Genitourinary: Negative for urgency, hematuria, flank pain, discharge, difficulty urinating and genital sores.  Musculoskeletal: Negative for myalgias, back pain, joint swelling, arthralgias and gait problem.  Skin: Negative for rash.  Neurological: Negative for dizziness, syncope, speech difficulty, weakness, numbness and headaches.  Hematological: Negative for adenopathy. Does not bruise/bleed easily.  Psychiatric/Behavioral: Negative for behavioral problems and dysphoric mood. The patient is not nervous/anxious.        Objective:   Physical Exam  Constitutional: He is oriented to person, place, and time. He appears well-developed.  HENT:  Head:  Normocephalic.  Right Ear: External ear normal.  Left Ear: External ear normal.  Eyes: Conjunctivae and EOM are normal.  Neck: Normal range of motion.  Cardiovascular: Normal rate and normal heart sounds.   Pulmonary/Chest: Breath sounds normal.       Rales right base  Abdominal: Bowel sounds are normal.  Musculoskeletal: Normal range of motion. He exhibits edema. He exhibits no tenderness.       +1 pedal edema  Neurological: He is alert and oriented to person, place, and time.  Psychiatric: He has a normal mood and affect. His behavior is normal.          Assessment & Plan:   COPD stable Diabetes mellitus. Will check a hemoglobin A1c Hypertension well controlled  CPX next visit

## 2012-04-09 ENCOUNTER — Other Ambulatory Visit (INDEPENDENT_AMBULATORY_CARE_PROVIDER_SITE_OTHER): Payer: Medicare Other

## 2012-04-09 DIAGNOSIS — E119 Type 2 diabetes mellitus without complications: Secondary | ICD-10-CM

## 2012-04-09 DIAGNOSIS — Z125 Encounter for screening for malignant neoplasm of prostate: Secondary | ICD-10-CM

## 2012-04-09 DIAGNOSIS — Z79899 Other long term (current) drug therapy: Secondary | ICD-10-CM

## 2012-04-09 DIAGNOSIS — Z0000 Encounter for general adult medical examination without abnormal findings: Secondary | ICD-10-CM

## 2012-04-09 LAB — LIPID PANEL
HDL: 35.5 mg/dL — ABNORMAL LOW (ref >39.00)
LDL Cholesterol: 43 mg/dL (ref 0–99)
Total CHOL/HDL Ratio: 3
VLDL: 20.8 mg/dL (ref 0.0–40.0)

## 2012-04-09 LAB — CBC WITH DIFFERENTIAL/PLATELET
Basophils Absolute: 0.1 10*3/uL (ref 0.0–0.1)
Basophils Relative: 0.7 % (ref 0.0–3.0)
Eosinophils Absolute: 0.4 10*3/uL (ref 0.0–0.7)
Hemoglobin: 13 g/dL (ref 13.0–17.0)
Lymphocytes Relative: 38.4 % (ref 12.0–46.0)
MCHC: 31.6 g/dL (ref 30.0–36.0)
MCV: 92.1 fl (ref 78.0–100.0)
Monocytes Absolute: 0.9 10*3/uL (ref 0.1–1.0)
Neutro Abs: 4.8 10*3/uL (ref 1.4–7.7)
RBC: 4.48 Mil/uL (ref 4.22–5.81)
RDW: 15.1 % — ABNORMAL HIGH (ref 11.5–14.6)

## 2012-04-09 LAB — BASIC METABOLIC PANEL
Chloride: 104 mEq/L (ref 96–112)
Creatinine, Ser: 0.8 mg/dL (ref 0.4–1.5)
GFR: 103.79 mL/min (ref >60.00)
Potassium: 5 mEq/L (ref 3.5–5.1)

## 2012-04-09 LAB — POCT URINALYSIS DIPSTICK
Blood, UA: NEGATIVE
Leukocytes, UA: NEGATIVE
Nitrite, UA: NEGATIVE
Urobilinogen, UA: 0.2
pH, UA: 7

## 2012-04-09 LAB — HEPATIC FUNCTION PANEL
Albumin: 3.8 g/dL (ref 3.5–5.2)
Total Protein: 7.1 g/dL (ref 6.0–8.3)

## 2012-04-09 LAB — HEMOGLOBIN A1C: Hgb A1c MFr Bld: 7.3 % — ABNORMAL HIGH (ref 4.6–6.5)

## 2012-04-10 LAB — TSH: TSH: 0.98 u[IU]/mL (ref 0.35–5.50)

## 2012-04-10 LAB — MICROALBUMIN / CREATININE URINE RATIO: Microalb Creat Ratio: 207.6 mg/g — ABNORMAL HIGH (ref 0.0–30.0)

## 2012-04-10 LAB — PSA: PSA: 11.77 ng/mL — ABNORMAL HIGH (ref 0.10–4.00)

## 2012-04-16 ENCOUNTER — Ambulatory Visit (INDEPENDENT_AMBULATORY_CARE_PROVIDER_SITE_OTHER): Payer: Medicare Other | Admitting: Internal Medicine

## 2012-04-16 ENCOUNTER — Encounter: Payer: Self-pay | Admitting: Internal Medicine

## 2012-04-16 VITALS — BP 142/70 | HR 72 | Temp 97.9°F | Resp 20 | Ht 69.5 in | Wt 205.0 lb

## 2012-04-16 DIAGNOSIS — Z0000 Encounter for general adult medical examination without abnormal findings: Secondary | ICD-10-CM

## 2012-04-16 DIAGNOSIS — E119 Type 2 diabetes mellitus without complications: Secondary | ICD-10-CM

## 2012-04-16 DIAGNOSIS — I10 Essential (primary) hypertension: Secondary | ICD-10-CM

## 2012-04-16 MED ORDER — HYDROCHLOROTHIAZIDE 25 MG PO TABS: 25.0000 mg | ORAL_TABLET | Freq: Every day | ORAL | Status: DC

## 2012-04-16 NOTE — Progress Notes (Signed)
Subjective:    Patient ID: Kyle [name], male    DOB: 1925/11/26, 78 y.o.   MRN: [id number]  HPI  Wt Readings from Last 3 Encounters:  04/16/12 205 lb (92.987 kg)  02/05/12 194 lb (87.998 kg)  11/05/11 196 lb (88.905 kg)   Subjective:    Patient ID: Kyle [name], male    DOB: 1925/10/28, 79 y.o.   MRN: [id number]  HPI History of Present Illness:   78 year-old patient who is seen today for a comprehensive evaluation. Medical problems include trifascicular block. He denies any exertional chest pain, shortness of breath. Denies any presyncopal symptoms. He is treated dyslipidemia, hypertension, and type 2 diabetes. He is followed by urology for an elevated PSA and BPH. He is doing quite well. Today. Unfortunately, he continues to smoke modestly. He denies much in the way of exercise limitations. He states that he walks several times weekly, and mows his lawn every Saturday.   Preventive Screening-Counseling & Management  Alcohol-Tobacco  Smoking Cessation Counseling: yes   Allergies:  No Known Drug Allergies   Past History:  Past Medical History:  Reviewed history from 10/11/2008 and no changes required.  Diabetes mellitus, type II  Hypertension  Hyperlipidemia  glaucoma  Benign prostatic hypertrophy  elevated PSA  tobacco use   Past Surgical History:  Reviewed history from 01/13/2008 and no changes required.  Tonsillectomy  sigmoidoscopy 2001  Negative Cardiolite stress test March 2006   Family History:  Reviewed history from 01/07/2007 and no changes required.  Family History of Prostate CA 1st degree relative <50  Family History Other cancer-Breast  Fam hx MI  father died of congestive heart failure with a history of prostate cancer  Mother died age 35 Cardiac dysrrythmia   Social History:  Reviewed history from 12/24/2006 and no changes required.  Current Smoker  Retired  Married   1. Risk factors, based on past  M,S,F history- vascular risk factors  include tobacco use hypertension dyslipidemia and diabetes  2.  Physical activities:No major limitations still mows his lawn occasionally  3.  Depression/mood:No history depression or mood disorder;   for the past 2 months has had worsening sleep habits. He describes vivid bothersome dreams that affect his getting back to sleep. He also feels that he is mildly depressed at the present time with little interest in daily activities  4.  Hearing:No deficits  5.  ADL's:Independent in all aspects of daily living  6.  Fall risk: low   7.  Home safety:No problems identified  8.  Height weight, and visual acuity;Height and weight stable no change in visual acuity  9.  Counseling:Tobacco cessation discussed and encouraged  10. Lab orders based on risk factors:Laboratory profile including hemoglobin A1c and PSA discussed  11. Referral : Followup urology as scheduled  12. Care plan: more regular exercise modest weight loss tobacco cessation all encouraged  13. Cognitive assessment: Alert and oriented with normal affect. No cognitive dysfunction.  Past Medical History  Diagnosis Date  . Diabetes mellitus   . Hypertension   . Hyperlipidemia   . Glaucoma   . Elevated PSA   . Benign prostatic hypertrophy    Past Surgical History  Procedure Date  . Tonsillectomy     reports that he has been smoking Cigarettes.  He has been smoking about .8 packs per day. He has never used smokeless tobacco. He reports that he drinks alcohol. He reports that he does not use illicit drugs. family history includes Cancer  in his father and Heart disease in his father and mother. No Known Allergies      Review of Systems  Constitutional: Negative for fever, chills, activity change, appetite change and fatigue.  HENT: Negative for hearing loss, ear pain, congestion, rhinorrhea, sneezing, mouth sores, trouble swallowing, neck pain, neck stiffness, dental problem, voice change, sinus pressure and tinnitus.     Eyes: Negative for photophobia, pain, redness and visual disturbance.  Respiratory: Negative for apnea, cough, choking, chest tightness, shortness of breath and wheezing.   Cardiovascular: Negative for chest pain, palpitations and leg swelling.  Gastrointestinal: Negative for nausea, vomiting, abdominal pain, diarrhea, constipation, blood in stool, abdominal distention, anal bleeding and rectal pain.  Genitourinary: Negative for dysuria, urgency, frequency, hematuria, flank pain, decreased urine volume, discharge, penile swelling, scrotal swelling, difficulty urinating, genital sores and testicular pain.  Musculoskeletal: Negative for myalgias, back pain, joint swelling, arthralgias and gait problem.  Skin: Negative for color change, rash and wound.  Neurological: Negative for dizziness, tremors, seizures, syncope, facial asymmetry, speech difficulty, weakness, light-headedness, numbness and headaches.  Hematological: Negative for adenopathy. Does not bruise/bleed easily.  Psychiatric/Behavioral: Negative for suicidal ideas, hallucinations, behavioral problems, confusion, sleep disturbance, self-injury, dysphoric mood, decreased concentration and agitation. The patient is not nervous/anxious.        Objective:   Physical Exam  Constitutional: He appears well-developed and well-nourished.  HENT:  Head: Normocephalic and atraumatic.  Right Ear: External ear normal.  Left Ear: External ear normal.  Nose: Nose normal.  Mouth/Throat: Oropharynx is clear and moist.  Eyes: Conjunctivae and EOM are normal. Pupils are equal, round, and reactive to light. No scleral icterus.  Neck: Normal range of motion. Neck supple. No JVD present. No thyromegaly present.  Cardiovascular: Regular rhythm, normal heart sounds and intact distal pulses.  Exam reveals no gallop and no friction rub.  No murmur heard.      Left supraclavicular and  bilateral femoral bruits  +2 peripheral edema  Pedal pulses were normal   Pulmonary/Chest: Effort normal and breath sounds normal. He exhibits no tenderness.  Abdominal: Soft. Bowel sounds are normal. He exhibits no distension and no mass. There is no tenderness.  Genitourinary: Penis normal.  Musculoskeletal: Normal range of motion. He exhibits no edema and no tenderness.  Lymphadenopathy:    He has no cervical adenopathy.  Neurological: He is alert. He has normal reflexes. No cranial nerve deficit. Coordination normal.  Skin: Skin is warm and dry. No rash noted.  Psychiatric: He has a normal mood and affect. His behavior is normal.          Assessment & Plan:  Preventive health examination Hypertension stable BPH with increased PSA Dyslipidemia Type 2 diabetes Tobacco use Peripheral edema. Probable aggravated by Norvasc but this has not been an issue in the past .  HCTZ was discontinued last visit. We'll resume Total cessation tobacco encouraged. Modest weight loss and a more regular exercise regimen also recommended;  return in 3 months for followup   Review of Systems     Objective:   Physical Exam        Assessment & Plan:

## 2012-04-16 NOTE — Patient Instructions (Addendum)
Limit your sodium (Salt) intake   Please check your hemoglobin A1c every 3 months    It is important that you exercise regularly, at least 20 minutes 3 to 4 times per week.  If you develop chest pain or shortness of breath seek  medical attention.  Smoking tobacco is very bad for your health. You should stop smoking immediately.  Please check your blood pressure on a regular basis.  If it is consistently greater than 150/90, please make an office appointment.  Resume hydrochlorothiazide

## 2012-05-23 ENCOUNTER — Other Ambulatory Visit: Payer: Self-pay | Admitting: Internal Medicine

## 2012-05-28 ENCOUNTER — Other Ambulatory Visit: Payer: Self-pay | Admitting: Internal Medicine

## 2012-06-03 ENCOUNTER — Other Ambulatory Visit: Payer: Self-pay | Admitting: *Deleted

## 2012-06-03 MED ORDER — GLIPIZIDE 5 MG PO TABS: 5.0000 mg | ORAL_TABLET | Freq: Two times a day (BID) | ORAL | Status: DC

## 2012-07-08 ENCOUNTER — Ambulatory Visit (INDEPENDENT_AMBULATORY_CARE_PROVIDER_SITE_OTHER): Payer: Medicare Other | Admitting: Internal Medicine

## 2012-07-08 ENCOUNTER — Encounter: Payer: Self-pay | Admitting: Internal Medicine

## 2012-07-08 VITALS — BP 140/74 | HR 65 | Temp 97.4°F | Resp 20 | Wt 203.0 lb

## 2012-07-08 DIAGNOSIS — J449 Chronic obstructive pulmonary disease, unspecified: Secondary | ICD-10-CM

## 2012-07-08 DIAGNOSIS — E119 Type 2 diabetes mellitus without complications: Secondary | ICD-10-CM

## 2012-07-08 DIAGNOSIS — E785 Hyperlipidemia, unspecified: Secondary | ICD-10-CM

## 2012-07-08 DIAGNOSIS — I10 Essential (primary) hypertension: Secondary | ICD-10-CM

## 2012-07-08 LAB — HEMOGLOBIN A1C: Hgb A1c MFr Bld: 7.4 % — ABNORMAL HIGH (ref 4.6–6.5)

## 2012-07-08 NOTE — Patient Instructions (Addendum)
Please check your hemoglobin A1c every 3 months  Limit your sodium (Salt) intake    It is important that you exercise regularly, at least 20 minutes 3 to 4 times per week.  If you develop chest pain or shortness of breath seek  medical attention.  You need to lose weight.  Consider a lower calorie diet and regular exercise. 

## 2012-07-08 NOTE — Progress Notes (Signed)
Subjective:    Patient ID: Kyle [name], male    DOB: Apr 27, 1926, 79 y.o.   MRN: [id number]  HPI  79 year old patient who is seen today for followup. He has hypertension dyslipidemia and type 2 diabetes. No concerns or complaints. No home blood sugar monitoring. He remains on simvastatin as he continues to tolerate well. Last hemoglobin A1c was up slightly. No history of hypoglycemia. He has done well and recovering from a mild URI  Past Medical History  Diagnosis Date  . Diabetes mellitus   . Hypertension   . Hyperlipidemia   . Glaucoma   . Elevated PSA   . Benign prostatic hypertrophy     History   Social History  . Marital Status: Married    Spouse Name: N/A    Number of Children: N/A  . Years of Education: N/A   Occupational History  . Not on file.   Social History Main Topics  . Smoking status: Current Every Day Smoker -- 0.8 packs/day    Types: Cigarettes  . Smokeless tobacco: Never Used  . Alcohol Use: Yes  . Drug Use: No  . Sexually Active: Not on file   Other Topics Concern  . Not on file   Social History Narrative  . No narrative on file    Past Surgical History  Procedure Date  . Tonsillectomy     Family History  Problem Relation Age of Onset  . Heart disease Mother   . Heart disease Father   . Cancer Father     prostate    No Known Allergies  Current Outpatient Prescriptions on File Prior to Visit  Medication Sig Dispense Refill  . albuterol (PROVENTIL,VENTOLIN) 90 MCG/ACT inhaler Inhale 2 puffs into the lungs 2 (two) times daily.        Marland Kitchen amLODipine (NORVASC) 10 MG tablet TAKE 1 TABLET ONCE DAILY.  90 tablet  0  . aspirin 325 MG tablet Take 325 mg by mouth. 1/2 tab daily       . doxazosin (CARDURA) 4 MG tablet One half tablet daily  30 tablet  11  . glipiZIDE (GLUCOTROL) 5 MG tablet Take 1 tablet (5 mg total) by mouth 2 (two) times daily before a meal.  60 tablet  3  . GLUCOPHAGE 1000 MG tablet TAKE 1 TABLET TWICE DAILY.  180 each  3    . hydrochlorothiazide (HYDRODIURIL) 25 MG tablet Take 1 tablet (25 mg total) by mouth daily.  90 tablet  3  . latanoprost (XALATAN) 0.005 % ophthalmic solution 1 drop at bedtime.      Marland Kitchen lisinopril (PRINIVIL,ZESTRIL) 40 MG tablet TAKE 1 TABLET ONCE DAILY.  90 tablet  2  . NAPROSYN 500 MG tablet TAKE (1) TABLET TWICE A DAY AS NEEDED.  60 each  1  . simvastatin (ZOCOR) 20 MG tablet TAKE ONE TABLET AT BEDTIME.  90 tablet  0    BP 140/74  Pulse 65  Temp 97.4 F (36.3 C) (Oral)  Resp 20  Wt 203 lb (92.08 kg)  SpO2 97%     Wt Readings from Last 3 Encounters:  07/08/12 203 lb (92.08 kg)  04/16/12 205 lb (92.987 kg)  02/05/12 194 lb (87.998 kg)    Review of Systems  Constitutional: Negative for fever, chills, appetite change and fatigue.  HENT: Negative for hearing loss, ear pain, congestion, sore throat, trouble swallowing, neck stiffness, dental problem, voice change and tinnitus.   Eyes: Negative for pain, discharge and visual disturbance.  Respiratory:  Positive for cough. Negative for chest tightness, wheezing and stridor.   Cardiovascular: Negative for chest pain, palpitations and leg swelling.  Gastrointestinal: Negative for nausea, vomiting, abdominal pain, diarrhea, constipation, blood in stool and abdominal distention.  Genitourinary: Negative for urgency, hematuria, flank pain, discharge, difficulty urinating and genital sores.  Musculoskeletal: Negative for myalgias, back pain, joint swelling, arthralgias and gait problem.  Skin: Negative for rash.  Neurological: Negative for dizziness, syncope, speech difficulty, weakness, numbness and headaches.  Hematological: Negative for adenopathy. Does not bruise/bleed easily.  Psychiatric/Behavioral: Negative for behavioral problems and dysphoric mood. The patient is not nervous/anxious.        Objective:   Physical Exam  Constitutional: He is oriented to person, place, and time. He appears well-developed.  HENT:  Head:  Normocephalic.  Right Ear: External ear normal.  Left Ear: External ear normal.  Eyes: Conjunctivae normal and EOM are normal.  Neck: Normal range of motion.  Cardiovascular: Normal rate and normal heart sounds.   Pulmonary/Chest: Effort normal. No respiratory distress. He has no wheezes.       Few coarse rhonchi  O2 saturation 97%  Abdominal: Bowel sounds are normal.  Musculoskeletal: Normal range of motion. He exhibits no edema and no tenderness.  Neurological: He is alert and oriented to person, place, and time.  Psychiatric: He has a normal mood and affect. His behavior is normal.          Assessment & Plan:   Hypertension Type 2 diabetes. We'll check a hemoglobin A1c Dyslipidemia. Continue simvastatin   COPD stable. Resolving URI

## 2012-08-13 ENCOUNTER — Telehealth: Payer: Self-pay | Admitting: Internal Medicine

## 2012-08-13 NOTE — Telephone Encounter (Signed)
Pt "passed out" while eating breakfast this am. EMT 's were called. Pt refused to go with them.  BP  and memory OK. Daughter is concerned this is a warning sign. No appts except "Same day"; until  Wed. Pls advise.

## 2012-08-14 ENCOUNTER — Encounter: Payer: Self-pay | Admitting: Internal Medicine

## 2012-08-14 ENCOUNTER — Ambulatory Visit (INDEPENDENT_AMBULATORY_CARE_PROVIDER_SITE_OTHER): Payer: Medicare Other

## 2012-08-14 ENCOUNTER — Ambulatory Visit (INDEPENDENT_AMBULATORY_CARE_PROVIDER_SITE_OTHER): Payer: Medicare Other | Admitting: Internal Medicine

## 2012-08-14 VITALS — BP 140/70 | HR 68 | Temp 97.0°F | Resp 20 | Wt 202.0 lb

## 2012-08-14 DIAGNOSIS — R55 Syncope and collapse: Secondary | ICD-10-CM

## 2012-08-14 DIAGNOSIS — IMO0002 Reserved for concepts with insufficient information to code with codable children: Secondary | ICD-10-CM

## 2012-08-14 DIAGNOSIS — E119 Type 2 diabetes mellitus without complications: Secondary | ICD-10-CM

## 2012-08-14 DIAGNOSIS — I10 Essential (primary) hypertension: Secondary | ICD-10-CM

## 2012-08-14 LAB — GLUCOSE, POCT (MANUAL RESULT ENTRY): POC Glucose: 147 mg/dl — AB (ref 70–99)

## 2012-08-14 NOTE — Patient Instructions (Signed)
Cardiac evaluation as discussed  Return or call if you develop any recurrent symptoms or report to the hospital for evaluation

## 2012-08-14 NOTE — Progress Notes (Signed)
Subjective:    Patient ID: Kyle [name], male    DOB: 1925-10-31, 79 y.o.   MRN: [id number]  HPI  79 year old patient who has hypertension diabetes dyslipidemia and ongoing tobacco use. He has type 2 diabetes controlled on oral medications. 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  Yesterday he was sitting at the kitchen table in joint coffee with a friend when he had a sudden syncopal episode without warning. This was quite brief and when he awoke he was slightly lightheaded for just a brief period of time. He states his friend was performing chest compressions. Otherwise he has felt well. He denies any exertional chest pain or shortness of breath. Denies any hypoglycemia.  Past Medical History  Diagnosis Date  . Diabetes mellitus   . Hypertension   . Hyperlipidemia   . Glaucoma   . Elevated PSA   . Benign prostatic hypertrophy     History   Social History  . Marital Status: Married    Spouse Name: N/A    Number of Children: N/A  . Years of Education: N/A   Occupational History  . Not on file.   Social History Main Topics  . Smoking status: Current Every Day Smoker -- 0.80 packs/day    Types: Cigarettes  . Smokeless tobacco: Never Used  . Alcohol Use: Yes  . Drug Use: No  . Sexually Active: Not on file   Other Topics Concern  . Not on file   Social History Narrative  . No narrative on file    Past Surgical History  Procedure Laterality Date  . Tonsillectomy      Family History  Problem Relation Age of Onset  . Heart disease Mother   . Heart disease Father   . Cancer Father     prostate    No Known Allergies  Current Outpatient Prescriptions on File Prior to Visit  Medication Sig Dispense Refill  . albuterol (PROVENTIL,VENTOLIN) 90 MCG/ACT inhaler Inhale 2 puffs into the lungs 2 (two) times daily.        Marland Kitchen amLODipine (NORVASC) 10 MG tablet TAKE 1 TABLET ONCE  DAILY.  90 tablet  0  . aspirin 325 MG tablet Take 325 mg by mouth. 1/2 tab daily       . doxazosin (CARDURA) 4 MG tablet One half tablet daily  30 tablet  11  . glipiZIDE (GLUCOTROL) 5 MG tablet Take 1 tablet (5 mg total) by mouth 2 (two) times daily before a meal.  60 tablet  3  . GLUCOPHAGE 1000 MG tablet TAKE 1 TABLET TWICE DAILY.  180 each  3  . hydrochlorothiazide (HYDRODIURIL) 25 MG tablet Take 1 tablet (25 mg total) by mouth daily.  90 tablet  3  . latanoprost (XALATAN) 0.005 % ophthalmic solution 1 drop at bedtime.      Marland Kitchen lisinopril (PRINIVIL,ZESTRIL) 40 MG tablet TAKE 1 TABLET ONCE DAILY.  90 tablet  2  . NAPROSYN 500 MG tablet TAKE (1) TABLET TWICE A DAY AS NEEDED.  60 each  1  . simvastatin (ZOCOR) 20 MG tablet TAKE ONE TABLET AT BEDTIME.  90 tablet  0   No current facility-administered medications on file prior to visit.    BP 140/70  Pulse 68  Temp(Src) 97 F (36.1 C) (Oral)  Resp 20  Wt 202 lb (91.627 kg)  BMI 29.41 kg/m2  SpO2 94%  Review of Systems  Constitutional: Negative for fever, chills, appetite change and fatigue.  HENT: Negative for hearing loss, ear pain, congestion, sore throat, trouble swallowing, neck stiffness, dental problem, voice change and tinnitus.   Eyes: Negative for pain, discharge and visual disturbance.  Respiratory: Negative for cough, chest tightness, wheezing and stridor.   Cardiovascular: Negative for chest pain, palpitations and leg swelling.  Gastrointestinal: Negative for nausea, vomiting, abdominal pain, diarrhea, constipation, blood in stool and abdominal distention.  Genitourinary: Negative for urgency, hematuria, flank pain, discharge, difficulty urinating and genital sores.  Musculoskeletal: Negative for myalgias, back pain, joint swelling, arthralgias and gait problem.  Skin: Negative for rash.  Neurological: Positive for syncope. Negative for dizziness, speech difficulty, weakness, numbness and headaches.   Hematological: Negative for adenopathy. Does not bruise/bleed easily.  Psychiatric/Behavioral: Negative for behavioral problems and dysphoric mood. The patient is not nervous/anxious.        Objective:   Physical Exam  Constitutional: He is oriented to person, place, and time. He appears well-developed.  HENT:  Head: Normocephalic.  Right Ear: External ear normal.  Left Ear: External ear normal.  Eyes: Conjunctivae and EOM are normal.  Neck: Normal range of motion.  Brisk carotid upstroke  Cardiovascular: Normal rate, regular rhythm and normal heart sounds.   Pulmonary/Chest: Breath sounds normal.  A few bibasilar crackles  Abdominal: Bowel sounds are normal.  Musculoskeletal: Normal range of motion. He exhibits no edema and no tenderness.  Neurological: He is alert and oriented to person, place, and time.  Psychiatric: He has a normal mood and affect. His behavior is normal.          Assessment & Plan:   Suspect  cardiac syncope in a patient with chronic trifascicular block. Discuss with cardiology who will evaluate and set up for an event monitor and 2-D echocardiogram. In view of his multiple cardiac risk factors will also be considered for heart catheterization Diabetes mellitus Hypertension

## 2012-08-14 NOTE — Progress Notes (Signed)
Placed a 30 event monitor on patient and went over instructions on how to use it and when to return it

## 2012-08-14 NOTE — Telephone Encounter (Signed)
appt set 11:30

## 2012-08-17 ENCOUNTER — Encounter: Payer: Self-pay | Admitting: Internal Medicine

## 2012-08-17 ENCOUNTER — Ambulatory Visit (INDEPENDENT_AMBULATORY_CARE_PROVIDER_SITE_OTHER): Payer: Medicare Other | Admitting: Internal Medicine

## 2012-08-17 ENCOUNTER — Institutional Professional Consult (permissible substitution): Payer: Medicare Other | Admitting: Cardiovascular Disease

## 2012-08-17 VITALS — BP 152/62 | HR 72 | Ht 70.0 in | Wt 202.0 lb

## 2012-08-17 DIAGNOSIS — G4733 Obstructive sleep apnea (adult) (pediatric): Secondary | ICD-10-CM

## 2012-08-17 DIAGNOSIS — I453 Trifascicular block: Secondary | ICD-10-CM

## 2012-08-17 DIAGNOSIS — R609 Edema, unspecified: Secondary | ICD-10-CM

## 2012-08-17 DIAGNOSIS — R55 Syncope and collapse: Secondary | ICD-10-CM

## 2012-08-17 NOTE — Assessment & Plan Note (Signed)
As above.

## 2012-08-17 NOTE — Patient Instructions (Addendum)
Your physician has requested that you have an echocardiogram. Echocardiography is a painless test that uses sound waves to create images of your heart. It provides your doctor with information about the size and shape of your heart and how well your heart's chambers and valves are working. This procedure takes approximately one hour. There are no restrictions for this procedure.  Your physician recommends that you schedule a follow-up appointment in: 4 weeks with Dr. Graciela Husbands.  Your physician recommends that you continue on your current medications as directed. Please refer to the Current Medication list given to you today.

## 2012-08-17 NOTE — Progress Notes (Signed)
ELECTROPHYSIOLOGY CONSULT NOTE  Patient ID: Kyle [name], MRN: [id number], DOB/AGE: 07/23/1925 79 y.o. Admit date: (Not on file) Date of Consult: 08/17/2012  Primary Physician: Rogelia Boga, MD Primary Cardiologist: new  Chief Complaint: syncope   HPI Kyle [name] is a 79 y.o. male seen at the request of Dr. Kirtland Bouchard. because of an episode of syncope.  He has no known heart disease. He does have diabetes and hypertension. He does not smoke.  He has no prior history of syncope and or presyncope.  While sitting at [facility] and [employer] having finished breakfast and talking, he suddenly lost consciousness without warning. He awakened also without residua. He was out for was estimated at 20-30 seconds. EMS was called. I cannot find records other visit to the emergency room.  He has modest exercise intolerance, but notes no recent significant decompensation. He denies chest discomfort dyspnea or lightheadedness. His son does note some worsening of exercise tolerance it has been gradual Past Medical History  Diagnosis Date  . Diabetes mellitus   . Hypertension   . Hyperlipidemia   . Glaucoma   . Elevated PSA   . Benign prostatic hypertrophy       Surgical History:  Past Surgical History  Procedure Laterality Date  . Tonsillectomy       Home Meds: Prior to Admission medications   Medication Sig Start Date End Date Taking? Authorizing [place]  albuterol (PROVENTIL,VENTOLIN) 90 MCG/ACT inhaler Inhale 2 puffs into the lungs 2 (two) times daily.     Yes Historical Lucciano Vitali, MD  amLODipine (NORVASC) 10 MG tablet TAKE 1 TABLET ONCE DAILY. 05/23/12  Yes Gordy Savers, MD  aspirin 325 MG tablet Take 325 mg by mouth. 1/2 tab daily    Yes Historical Makyah Lavigne, MD  doxazosin (CARDURA) 4 MG tablet One half tablet daily 11/05/11  Yes Gordy Savers, MD  glipiZIDE (GLUCOTROL) 5 MG tablet Take 1 tablet (5 mg total) by mouth 2 (two) times daily before a meal. 06/03/12 06/03/13  Yes Gordy Savers, MD  GLUCOPHAGE 1000 MG tablet TAKE 1 TABLET TWICE DAILY. 08/19/11  Yes Gordy Savers, MD  hydrochlorothiazide (HYDRODIURIL) 25 MG tablet Take 1 tablet (25 mg total) by mouth daily. 04/16/12  Yes Gordy Savers, MD  latanoprost (XALATAN) 0.005 % ophthalmic solution 1 drop at bedtime.   Yes Historical Tarrin Menn, MD  lisinopril (PRINIVIL,ZESTRIL) 40 MG tablet TAKE 1 TABLET ONCE DAILY. 10/31/11  Yes Gordy Savers, MD  NAPROSYN 500 MG tablet TAKE (1) TABLET TWICE A DAY AS NEEDED. 08/07/11  Yes Gordy Savers, MD  simvastatin (ZOCOR) 20 MG tablet TAKE ONE TABLET AT BEDTIME. 05/28/12  Yes Gordy Savers, MD     Allergies: No Known Allergies  History   Social History  . Marital Status: Married    Spouse Name: N/A    Number of Children: N/A  . Years of Education: N/A   Occupational History  . Not on file.   Social History Main Topics  . Smoking status: Current Every Day Smoker -- 0.80 packs/day    Types: Cigarettes  . Smokeless tobacco: Never Used  . Alcohol Use: Yes  . Drug Use: No  . Sexually Active: Not on file   Other Topics Concern  . Not on file   Social History Narrative  . No narrative on file     Family History  Problem Relation Age of Onset  . Heart disease Mother   . Heart disease Father   .  Cancer Father     prostate     ROS:  Please see the history of present illness.   Negative except Back pain, knee pain, obstructive breathing patterns at night with daytime somnolence  All other systems reviewed and negative.    Physical Exam:   Blood pressure 152/62, pulse 72, height 5\' 10"  (1.778 m), weight 202 lb (91.627 kg). General: Well developed, well nourished male in no acute distress. Head: Normocephalic, atraumatic, sclera non-icteric, no xanthomas, nares are without discharge. EENT: normal Lymph Nodes:  none Back: without scoliosis/kyphosis , no CVA tendersness Neck: Negative for carotid bruits. JVD not  elevated. Lungs: Clear bilaterally to auscultation without wheezes, rales, or rhonchi. Breathing is unlabored. Heart: RRR with S1 S2. No murmur , rubs, or gallops appreciated. Abdomen: Soft, non-tender, non-distended with normoactive bowel sounds. No hepatomegaly. No rebound/guarding. No obvious abdominal masses. Msk:  Strength and tone appear normal for age. Extremities: No clubbing or cyanosis. No edema.  Distal pedal pulses are 2+ and equal bilaterally. Skin: Warm and Dry Neuro: Alert and oriented X 3. CN III-XII intact Grossly normal sensory and motor function . Psych:  Responds to questions appropriately with a normal affect.      Labs: Cardiac Enzymes No results found for this basename: CKTOTAL, CKMB, TROPONINI,  in the last 72 hours CBC Lab Results  Component Value Date   WBC 10.1 04/09/2012   HGB 13.0 04/09/2012   HCT 41.2 04/09/2012   MCV 92.1 04/09/2012   PLT 171.0 04/09/2012   PROTIME: No results found for this basename: LABPROT, INR,  in the last 72 hours Chemistry No results found for this basename: NA, K, CL, CO2, BUN, CREATININE, CALCIUM, LABALBU, PROT, BILITOT, ALKPHOS, ALT, AST, GLUCOSE,  in the last 168 hours Lipids Lab Results  Component Value Date   CHOL 99 04/09/2012   HDL 35.50* 04/09/2012   LDLCALC 43 04/09/2012   TRIG 104.0 04/09/2012   BNP No results found for this basename: probnp   Miscellaneous No results found for this basename: DDIMER    Radiology/Studies:  No results found.  EKG: nsr .27/14/43 RBBB/LAD   Assessment and Plan:    Sherryl Manges

## 2012-08-17 NOTE — Assessment & Plan Note (Signed)
The patient had an isolated episode of abrupt onset offset syncope in the setting of trifascicular block and an ECG that suggests a possibility of a prior anterior wall MI. It could also been inferior wall MI masquerading behind the left anterior fascicular block. The alacrity of onset and offset would be most consistent with intermittent complete heart block. Polymorphic ventricular tachycardia for which we have no explanation would also be possiBLE  He is currently wearing an event recorder; if this turns out to be unrevealing, I would consider it recorder in the high pretest probability the context of his trifascicular block. His evaluation of his cardiac function and structure is abnormal he'll need further testing, specifics being dictated by the findings. This could include stress testing, catheterization, EP testing.  He is advised in the interim not to drive.  I should further note but I don't think that this is deglutition syncope given relationship to coffee that he was having drinking but apparently was not drinking at the moment of syncope. Also his carotid sinus massage was negative

## 2012-08-17 NOTE — Assessment & Plan Note (Signed)
His edema may be related to salt and volume overload; it may also be related to his amlodipine without suggest to Dr. Jae Dire we undertake a trial of amlodipine exclusion prior to up titration of those medications

## 2012-08-17 NOTE — Assessment & Plan Note (Signed)
He has, according to his son significant nocturnal obstructive breathing as well as apnea. He has daytime somnolence. He is poorly controlled high blood pressure. He should undergo sleep study

## 2012-08-18 ENCOUNTER — Ambulatory Visit (HOSPITAL_COMMUNITY): Payer: Medicare Other | Attending: Cardiovascular Disease | Admitting: Radiology

## 2012-08-18 ENCOUNTER — Telehealth: Payer: Self-pay | Admitting: Internal Medicine

## 2012-08-18 DIAGNOSIS — R55 Syncope and collapse: Secondary | ICD-10-CM

## 2012-08-18 NOTE — Telephone Encounter (Signed)
New problem   Pt's son need to talk to you about results concerning his dad Echocardiogram.

## 2012-08-18 NOTE — Progress Notes (Signed)
Echocardiogram performed.  

## 2012-08-18 NOTE — Telephone Encounter (Signed)
I spoke with the patient's son. He was wanting to be called with the echo results for his father and needed to know if there was any paper work to be signed in order for results to be called to him. I advised that the patient should complete a form stating we can release information to his son. I stated I would call the patient first with his results and then I can call him with the patient's permission. The patient's son voices understanding.

## 2012-08-20 ENCOUNTER — Institutional Professional Consult (permissible substitution): Payer: Medicare Other | Admitting: Cardiology

## 2012-08-21 ENCOUNTER — Other Ambulatory Visit: Payer: Self-pay

## 2012-08-21 ENCOUNTER — Encounter (HOSPITAL_COMMUNITY): Payer: Self-pay | Admitting: Emergency Medicine

## 2012-08-21 ENCOUNTER — Telehealth: Payer: Self-pay | Admitting: Physician Assistant

## 2012-08-21 ENCOUNTER — Emergency Department (HOSPITAL_COMMUNITY): Payer: Medicare Other

## 2012-08-21 ENCOUNTER — Inpatient Hospital Stay (HOSPITAL_COMMUNITY)
Admission: EM | Admit: 2012-08-21 | Discharge: 2012-08-25 | DRG: 242 | Disposition: A | Discharge status: Home / Self Care | Payer: Medicare Other | Attending: Cardiology | Admitting: Cardiology

## 2012-08-21 DIAGNOSIS — F17200 Nicotine dependence, unspecified, uncomplicated: Secondary | ICD-10-CM | POA: Diagnosis present

## 2012-08-21 DIAGNOSIS — N400 Enlarged prostate without lower urinary tract symptoms: Secondary | ICD-10-CM

## 2012-08-21 DIAGNOSIS — Z7982 Long term (current) use of aspirin: Secondary | ICD-10-CM

## 2012-08-21 DIAGNOSIS — G4733 Obstructive sleep apnea (adult) (pediatric): Secondary | ICD-10-CM

## 2012-08-21 DIAGNOSIS — R609 Edema, unspecified: Secondary | ICD-10-CM

## 2012-08-21 DIAGNOSIS — J449 Chronic obstructive pulmonary disease, unspecified: Secondary | ICD-10-CM

## 2012-08-21 DIAGNOSIS — J4489 Other specified chronic obstructive pulmonary disease: Secondary | ICD-10-CM

## 2012-08-21 DIAGNOSIS — I453 Trifascicular block: Secondary | ICD-10-CM | POA: Diagnosis present

## 2012-08-21 DIAGNOSIS — E119 Type 2 diabetes mellitus without complications: Secondary | ICD-10-CM

## 2012-08-21 DIAGNOSIS — E785 Hyperlipidemia, unspecified: Secondary | ICD-10-CM

## 2012-08-21 DIAGNOSIS — I469 Cardiac arrest, cause unspecified: Secondary | ICD-10-CM

## 2012-08-21 DIAGNOSIS — I452 Bifascicular block: Principal | ICD-10-CM | POA: Diagnosis present

## 2012-08-21 DIAGNOSIS — R972 Elevated prostate specific antigen [PSA]: Secondary | ICD-10-CM

## 2012-08-21 DIAGNOSIS — I10 Essential (primary) hypertension: Secondary | ICD-10-CM

## 2012-08-21 DIAGNOSIS — R55 Syncope and collapse: Secondary | ICD-10-CM | POA: Diagnosis present

## 2012-08-21 DIAGNOSIS — H409 Unspecified glaucoma: Secondary | ICD-10-CM

## 2012-08-21 LAB — CBC WITH DIFFERENTIAL/PLATELET
Hemoglobin: 12.6 g/dL — ABNORMAL LOW (ref 13.0–17.0)
Lymphs Abs: 3.1 10*3/uL (ref 0.7–4.0)
Monocytes Relative: 7 % (ref 3–12)
Neutro Abs: 7.1 10*3/uL (ref 1.7–7.7)
Neutrophils Relative %: 62 % (ref 43–77)
Platelets: 203 10*3/uL (ref 150–400)
RBC: 4.16 MIL/uL — ABNORMAL LOW (ref 4.22–5.81)
WBC: 11.3 10*3/uL — ABNORMAL HIGH (ref 4.0–10.5)

## 2012-08-21 LAB — COMPREHENSIVE METABOLIC PANEL
ALT: 11 U/L (ref 0–53)
Albumin: 3.8 g/dL (ref 3.5–5.2)
Alkaline Phosphatase: 43 U/L (ref 39–117)
BUN: 19 mg/dL (ref 6–23)
Chloride: 96 mEq/L (ref 96–112)
Glucose, Bld: 219 mg/dL — ABNORMAL HIGH (ref 70–99)
Potassium: 3.8 mEq/L (ref 3.5–5.1)
Sodium: 136 mEq/L (ref 135–145)
Total Bilirubin: 0.3 mg/dL (ref 0.3–1.2)

## 2012-08-21 MED ORDER — ONDANSETRON HCL 4 MG/2ML IJ SOLN: 4.0000 mg | Freq: Three times a day (TID) | INTRAMUSCULAR | Status: AC | PRN

## 2012-08-21 NOTE — H&P (Signed)
CARDIOLOGY ADMISSION NOTE  Patient ID: Kyle [name] MRN: [id number] DOB/AGE: 23-May-1926 79 y.o.  Admit date: 08/21/2012 Primary Physician   Rogelia Boga, MD Primary Cardiologist   Dr. Graciela Husbands Chief Complaint    Syncope  HPI:  The patient presents after a syncopal episode. He saw Dr. Graciela Husbands on 3/3 after having seen Dr. Freida Busman on 2/28 with a syncopal episode the day before that visit.  The patient has been wearing an event monitor.  Today he had another witness event at home with his wife.  He was on the phone with his son.  His wife is not at the bedside.  The patient does not know many details of this event.  He was reported on telemetry to have a 14.6 second episode of asystole with post event atrial fibrillation.  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  He denies any other events and has had no presyncope.  He denies chest pain, neck or arm pain.  He has no SOB, PND or orthopnea.  Echocardiogram demonstrated NL LV systolic function with mild pulmonary HTN.    Past Medical History  Diagnosis Date  . Diabetes mellitus   . Hypertension   . Hyperlipidemia   . Glaucoma   . Elevated PSA   . Benign prostatic hypertrophy     Past Surgical History  Procedure Laterality Date  . Tonsillectomy      No Known Allergies No current facility-administered medications on file prior to encounter.   Current Outpatient Prescriptions on File Prior to Encounter  Medication Sig Dispense Refill  . albuterol (PROVENTIL,VENTOLIN) 90 MCG/ACT inhaler Inhale 2 puffs into the lungs 2 (two) times daily.        Marland Kitchen amLODipine (NORVASC) 10 MG tablet TAKE 1 TABLET ONCE DAILY.  90 tablet  0  . aspirin 325 MG tablet Take 325 mg by mouth. 1/2 tab daily       . doxazosin (CARDURA) 4 MG tablet One half tablet daily  30 tablet  11  . glipiZIDE (GLUCOTROL) 5 MG tablet Take 1 tablet (5 mg total) by mouth 2 (two) times daily before a meal.  60 tablet  3  . GLUCOPHAGE 1000 MG  tablet TAKE 1 TABLET TWICE DAILY.  180 each  3  . hydrochlorothiazide (HYDRODIURIL) 25 MG tablet Take 1 tablet (25 mg total) by mouth daily.  90 tablet  3  . latanoprost (XALATAN) 0.005 % ophthalmic solution 1 drop at bedtime.      Marland Kitchen lisinopril (PRINIVIL,ZESTRIL) 40 MG tablet TAKE 1 TABLET ONCE DAILY.  90 tablet  2  . simvastatin (ZOCOR) 20 MG tablet TAKE ONE TABLET AT BEDTIME.  90 tablet  0   History   Social History  . Marital Status: Married    Spouse Name: N/A    Number of Children: N/A  . Years of Education: N/A   Occupational History  . Not on file.   Social History Main Topics  . Smoking status: Current Every Day Smoker -- 0.50 packs/day    Types: Cigarettes  . Smokeless tobacco: Never Used  . Alcohol Use: 1.8 oz/week    1 Glasses of wine, 2 Shots of liquor per week  . Drug Use: No  . Sexually Active: Not on file   Other Topics Concern  . Not on file   Social History Narrative  . No narrative on file    Family History  Problem Relation Age of Onset  . Heart disease Mother   .  Heart disease Father   . Cancer Father     prostate    ROS:  As stated in the HPI and negative for all other systems.  Physical Exam: Blood pressure 149/63, pulse 71, temperature 98.5 F (36.9 C), temperature source Oral, resp. rate 24, SpO2 94.00%.  GENERAL:  Well appearing HEENT:  Pupils equal round and reactive, fundi not visualized, oral mucosa unremarkable NECK:  No jugular venous distention, waveform within normal limits, carotid upstroke brisk and symmetric, no bruits, no thyromegaly LYMPHATICS:  No cervical, inguinal adenopathy LUNGS:  Decreased breath sounds with mild expiratory wheezing.   BACK:  No CVA tenderness CHEST:  Unremarkable HEART:  PMI not displaced or sustained,S1 and S2 within normal limits, no S3, no S4, no clicks, no rubs, no murmurs, distant heart sounds.  ABD:  Flat, positive bowel sounds normal in frequency in pitch, no bruits, no rebound, no guarding, no  midline pulsatile mass, no hepatomegaly, no splenomegaly EXT:  2 plus pulses throughout, mild edema, no cyanosis no clubbing SKIN:  No rashes no nodules NEURO:  Cranial nerves II through XII grossly intact, motor grossly intact throughout PSYCH:  Cognitively intact, oriented to person place and time  Labs: Lab Results  Component Value Date   BUN 19 08/21/2012   Lab Results  Component Value Date   CREATININE 0.82 08/21/2012   Lab Results  Component Value Date   NA 136 08/21/2012   K 3.8 08/21/2012   CL 96 08/21/2012   CO2 26 08/21/2012   Lab Results  Component Value Date   TROPONINI <0.30 08/21/2012   Lab Results  Component Value Date   WBC 11.3* 08/21/2012   HGB 12.6* 08/21/2012   HCT 37.3* 08/21/2012   MCV 89.7 08/21/2012   PLT 203 08/21/2012    Lab Results  Component Value Date   ALT 11 08/21/2012   AST 14 08/21/2012   ALKPHOS 43 08/21/2012   BILITOT 0.3 08/21/2012     Radiology:  CXR No acute cardiopulmonary process seen; lungs mildly hyperexpanded.  EKG:  Rate 79, RBBB, LAD, first degree AV block.  ASSESSMENT AND PLAN:    Syncope:  The patient will need a pacemaker.  I will place him in stepdown with Zoll pads.  HTN:  I will hold amlodipine but continue lisinopril.  Diabetes:  Continue glucophage.  Tobacco abuse:  He is not likely to quit smoking despite education.     SignedRollene Rotunda 08/21/2012, 11:55 PM

## 2012-08-21 NOTE — ED Notes (Signed)
Dr. Manus Gunning at bedside to discuss plan of care with pt and family

## 2012-08-21 NOTE — ED Provider Notes (Signed)
History     CSN: [id number]  Arrival date & time 08/21/12  2048   First MD Initiated Contact with Patient 08/21/12 2108      No chief complaint on file.   (Consider location/radiation/quality/duration/timing/severity/associated sxs/prior treatment) HPI Comments: Patient presents with syncopal episode at home that was witnessed by his wife. He is wearing a cardiac event monitor. He had a syncopal episode last week as well as referred to cardiology. He denies any chest pain, shortness of breath, nausea or vomiting. No seizure activity. Back to baseline now. He had no prodrome. No dizziness, lightheadedness, blurred vision. Her monitoring company. Rhythm was representative described a 14.6 sec "ventricular standstill" with post-event atrial fibrillation as the identified rhythm. He was referred to the ED by cardiology.  The history is provided by the patient.    Past Medical History  Diagnosis Date  . Diabetes mellitus   . Hypertension   . Hyperlipidemia   . Glaucoma   . Elevated PSA   . Benign prostatic hypertrophy     Past Surgical History  Procedure Laterality Date  . Tonsillectomy      Family History  Problem Relation Age of Onset  . Heart disease Mother   . Heart disease Father   . Cancer Father     prostate    History  Substance Use Topics  . Smoking status: Current Every Day Smoker -- 0.50 packs/day    Types: Cigarettes  . Smokeless tobacco: Never Used  . Alcohol Use: 1.8 oz/week    1 Glasses of wine, 2 Shots of liquor per week      Review of Systems  Constitutional: Positive for activity change and appetite change.  HENT: Negative for congestion and rhinorrhea.   Respiratory: Negative for cough, chest tightness and shortness of breath.   Cardiovascular: Negative for chest pain.  Gastrointestinal: Negative for nausea, vomiting and abdominal pain.  Genitourinary: Negative for dysuria and hematuria.  Musculoskeletal: Negative for back pain.  Skin: Negative  for rash.  Neurological: Positive for syncope. Negative for dizziness and light-headedness.  A complete 10 system review of systems was obtained and all systems are negative except as noted in the HPI and PMH.    Allergies  Review of patient's allergies indicates no known allergies.  Home Medications   Current Outpatient Rx  Name  Route  Sig  Dispense  Refill  . albuterol (PROVENTIL,VENTOLIN) 90 MCG/ACT inhaler   Inhalation   Inhale 2 puffs into the lungs 2 (two) times daily.           Marland Kitchen amLODipine (NORVASC) 10 MG tablet      TAKE 1 TABLET ONCE DAILY.   90 tablet   0   . aspirin 325 MG tablet   Oral   Take 325 mg by mouth. 1/2 tab daily          . doxazosin (CARDURA) 4 MG tablet      One half tablet daily   30 tablet   11   . glipiZIDE (GLUCOTROL) 5 MG tablet   Oral   Take 1 tablet (5 mg total) by mouth 2 (two) times daily before a meal.   60 tablet   3   . GLUCOPHAGE 1000 MG tablet      TAKE 1 TABLET TWICE DAILY.   180 each   3   . hydrochlorothiazide (HYDRODIURIL) 25 MG tablet   Oral   Take 1 tablet (25 mg total) by mouth daily.   90 tablet  3   . latanoprost (XALATAN) 0.005 % ophthalmic solution      1 drop at bedtime.         Marland Kitchen lisinopril (PRINIVIL,ZESTRIL) 40 MG tablet      TAKE 1 TABLET ONCE DAILY.   90 tablet   2   . naproxen (NAPROSYN) 500 MG tablet   Oral   Take 500 mg by mouth daily as needed. For gout attack per patient         . simvastatin (ZOCOR) 20 MG tablet      TAKE ONE TABLET AT BEDTIME.   90 tablet   0     BP 119/88  Pulse 78  Temp(Src) 98.5 F (36.9 C) (Oral)  Resp 22  SpO2 100%  Physical Exam  Constitutional: He is oriented to person, place, and time. He appears well-developed and well-nourished. No distress.  HENT:  Head: Normocephalic and atraumatic.  Mouth/Throat: Oropharynx is clear and moist. No oropharyngeal exudate.  Eyes: Conjunctivae and EOM are normal. Pupils are equal, round, and reactive to  light.  Neck: Normal range of motion. Neck supple.  Cardiovascular: Normal rate, regular rhythm and normal heart sounds.   No murmur heard. Pulmonary/Chest: Breath sounds normal. No respiratory distress.  Abdominal: Soft. Bowel sounds are normal. There is no tenderness. There is no rebound and no guarding.  Musculoskeletal: Normal range of motion. He exhibits no edema and no tenderness.  Neurological: He is alert and oriented to person, place, and time. No cranial nerve deficit. He exhibits normal muscle tone. Coordination normal.  Skin: Skin is warm.    ED Course  Procedures (including critical care time)  Labs Reviewed  CBC WITH DIFFERENTIAL - Abnormal; Notable for the following:    WBC 11.3 (*)    RBC 4.16 (*)    Hemoglobin 12.6 (*)    HCT 37.3 (*)    All other components within normal limits  COMPREHENSIVE METABOLIC PANEL - Abnormal; Notable for the following:    Glucose, Bld 219 (*)    GFR calc non Af Amer 79 (*)    All other components within normal limits  TROPONIN I   Dg Chest 2 View  08/21/2012  *RADIOLOGY REPORT*  Clinical Data: Syncope; history of smoking.  CHEST - 2 VIEW  Comparison: None.  Findings: The lungs are mildly hyperexpanded.  Pulmonary vascularity is at the upper limits of normal.  There is no evidence of focal opacification, pleural effusion or pneumothorax.  The heart is borderline normal in size; the mediastinal contour is within normal limits.  No acute osseous abnormalities are seen.  IMPRESSION: No acute cardiopulmonary process seen; lungs mildly hyperexpanded.   Original Report Authenticated By: Tonia Ghent, M.D.      1. Syncope       MDM  Syncopal episode while wearing cardiac event monitor. Patient feels at baseline denies complaint.  EKG shows normal sinus rhythm without ST changes. Patient is asymptomatic. Labs are unremarkable.  Difficulty contacting Dr. Antoine Poche of cardiology. The pager system is not functioning properly. Temp 90 minutes  to get in touch with him and he would like to admit patient to step down bed    Date: 08/21/2012  Rate: 79  Rhythm: normal sinus rhythm  QRS Axis: normal  Intervals: normal  ST/T Wave abnormalities: normal  Conduction Disutrbances:right bundle branch block and left anterior fascicular block  Narrative Interpretation:   Old EKG Reviewed: unchanged     Glynn Octave, MD 08/21/12 [phone]

## 2012-08-21 NOTE — Telephone Encounter (Signed)
Contacted by [employer] re: EKG abnormality. The patient has been placed on a cardiac monitor after following up with Dr. Graciela Husbands for a sudden syncopal episode. The representative described a 14.6 sec "ventricular standstill" with post-event atrial fibrillation as the identified rhythm. The patient was called and did note a syncopal episode of unclear duration without associated fall, trauma, chest pain, shortness of breath or injury. This occurred in the setting of a "heated argument." After discussing with Drs. Bensimhon and Ladona Ridgel, have advised the patient to be transported, either by family or EMS, to [facility] for monitoring over the weekend and PPM placement on Monday for documented extended sinus pause inducing his syncope. The patient expressed significant resistance initially, however it was again emphasized the importance that he seek medical care tonight. He stated he would check with his contacts. The advice to be transported  to the hospital by the above means as soon as possible for formal evaluation and monitoring was clearly stated.    Jacqulyn Bath, PA-C 08/21/2012 6:13 PM

## 2012-08-22 ENCOUNTER — Encounter (HOSPITAL_COMMUNITY): Payer: Self-pay | Admitting: Cardiology

## 2012-08-22 LAB — GLUCOSE, CAPILLARY
Glucose-Capillary: 162 mg/dL — ABNORMAL HIGH (ref 70–99)
Glucose-Capillary: 87 mg/dL (ref 70–99)
Glucose-Capillary: 101 mg/dL — ABNORMAL HIGH (ref 70–99)

## 2012-08-22 LAB — TROPONIN I
Troponin I: <0.3 ng/mL (ref <0.30)
Troponin I: <0.3 ng/mL (ref <0.30)

## 2012-08-22 LAB — MRSA PCR SCREENING: MRSA by PCR: NEGATIVE

## 2012-08-22 MED ORDER — HYDROCHLOROTHIAZIDE 25 MG PO TABS
25.0000 mg | ORAL_TABLET | Freq: Every day | ORAL | Status: DC
  Administered 2012-08-22 – 2012-08-24 (×3): 25 mg via ORAL
  Filled 2012-08-22 (×4): qty 1

## 2012-08-22 MED ORDER — METFORMIN HCL 500 MG PO TABS
1000.0000 mg | ORAL_TABLET | Freq: Two times a day (BID) | ORAL | Status: DC
  Administered 2012-08-22 – 2012-08-23 (×3): 1000 mg via ORAL
  Filled 2012-08-22 (×5): qty 2

## 2012-08-22 MED ORDER — DOXAZOSIN MESYLATE 4 MG PO TABS
4.0000 mg | ORAL_TABLET | Freq: Every day | ORAL | Status: DC
  Administered 2012-08-22 – 2012-08-24 (×3): 4 mg via ORAL
  Filled 2012-08-22 (×4): qty 1

## 2012-08-22 MED ORDER — ALBUTEROL 90 MCG/ACT IN AERS: 2.0000 | INHALATION_SPRAY | Freq: Two times a day (BID) | RESPIRATORY_TRACT | Status: DC

## 2012-08-22 MED ORDER — LATANOPROST 0.005 % OP SOLN
1.0000 [drp] | Freq: Every day | OPHTHALMIC | Status: DC
  Administered 2012-08-22 – 2012-08-24 (×3): 1 [drp] via OPHTHALMIC
  Filled 2012-08-22 (×2): qty 2.5

## 2012-08-22 MED ORDER — ASPIRIN 325 MG PO TABS
325.0000 mg | ORAL_TABLET | Freq: Once | ORAL | Status: AC
  Administered 2012-08-22: 325 mg via ORAL
  Filled 2012-08-22: qty 1

## 2012-08-22 MED ORDER — GLIPIZIDE 5 MG PO TABS
5.0000 mg | ORAL_TABLET | Freq: Two times a day (BID) | ORAL | Status: DC
  Administered 2012-08-22 – 2012-08-23 (×4): 5 mg via ORAL
  Filled 2012-08-22 (×9): qty 1

## 2012-08-22 MED ORDER — SIMVASTATIN 20 MG PO TABS
20.0000 mg | ORAL_TABLET | Freq: Every day | ORAL | Status: DC
  Administered 2012-08-22 – 2012-08-23 (×2): 20 mg via ORAL
  Filled 2012-08-22 (×4): qty 1

## 2012-08-22 MED ORDER — LISINOPRIL 40 MG PO TABS
40.0000 mg | ORAL_TABLET | Freq: Every day | ORAL | Status: DC
  Administered 2012-08-22 – 2012-08-24 (×3): 40 mg via ORAL
  Filled 2012-08-22 (×4): qty 1

## 2012-08-22 MED ORDER — ALBUTEROL SULFATE HFA 108 (90 BASE) MCG/ACT IN AERS
2.0000 | INHALATION_SPRAY | Freq: Two times a day (BID) | RESPIRATORY_TRACT | Status: DC
  Administered 2012-08-22 – 2012-08-24 (×5): 2 via RESPIRATORY_TRACT
  Filled 2012-08-22 (×2): qty 6.7

## 2012-08-22 NOTE — ED Notes (Signed)
Pt reports having syncopal episode this evening. Per pt he was sitting in a chair and "slumped over". Pt reports having a similar episode approx. 1 week ago, and was placed on a halter monitor by his Cardiologist Jeanelle Malling). Per pt's son, the company monitoring the pt called the home after the episode to report that his monitor had "gone off". Son and pt unable to report a specific heart rhythm. Pt Is A&Ox4 and interactive at this time, son is at bedside.

## 2012-08-22 NOTE — ED Notes (Signed)
Pt placed on Zoll pads for transport per admitting phsyician's request. Pt remains alert and interactive at this time

## 2012-08-22 NOTE — Progress Notes (Signed)
  Dr. Jenene Slicker admit note form early this am reviewed.  Patient admitted for 14-sec pause. Stable overnight. No events on tele.  Plan pacer Monday. Keep in SDU with Zoll's.  Addalie Calles,MD 9:45 AM

## 2012-08-22 NOTE — ED Notes (Signed)
Admitting physician at bedside for assessment.

## 2012-08-23 DIAGNOSIS — I10 Essential (primary) hypertension: Secondary | ICD-10-CM

## 2012-08-23 LAB — GLUCOSE, CAPILLARY
Glucose-Capillary: 150 mg/dL — ABNORMAL HIGH (ref 70–99)
Glucose-Capillary: 188 mg/dL — ABNORMAL HIGH (ref 70–99)
Glucose-Capillary: 244 mg/dL — ABNORMAL HIGH (ref 70–99)

## 2012-08-23 MED ORDER — AMLODIPINE BESYLATE 10 MG PO TABS
10.0000 mg | ORAL_TABLET | Freq: Every day | ORAL | Status: DC
  Administered 2012-08-23 – 2012-08-24 (×2): 10 mg via ORAL
  Filled 2012-08-23 (×3): qty 1

## 2012-08-23 MED ORDER — HYDRALAZINE HCL 20 MG/ML IJ SOLN
10.0000 mg | INTRAMUSCULAR | Status: DC | PRN
  Filled 2012-08-23: qty 0.5

## 2012-08-23 MED ORDER — DOCUSATE SODIUM 100 MG PO CAPS
200.0000 mg | ORAL_CAPSULE | Freq: Every day | ORAL | Status: DC | PRN
  Filled 2012-08-23: qty 2

## 2012-08-23 MED ORDER — BISACODYL 5 MG PO TBEC
5.0000 mg | DELAYED_RELEASE_TABLET | Freq: Every day | ORAL | Status: DC | PRN
  Administered 2012-08-23: 5 mg via ORAL
  Filled 2012-08-23: qty 1

## 2012-08-23 MED ORDER — ENOXAPARIN SODIUM 40 MG/0.4ML ~~LOC~~ SOLN
40.0000 mg | Freq: Two times a day (BID) | SUBCUTANEOUS | Status: DC
  Administered 2012-08-23 (×2): 40 mg via SUBCUTANEOUS
  Filled 2012-08-23 (×5): qty 0.4

## 2012-08-23 NOTE — Progress Notes (Addendum)
   CARDIOLOGY ADMISSION NOTE  Patient ID: Kyle [name] MRN: [id number] DOB/AGE: 24-Jan-1926 79 y.o.  Admit date: 08/21/2012 Primary Physician   Rogelia Boga, MD Primary Cardiologist   Dr. Graciela Husbands Chief Complaint    Syncope  SUBJECTIVE  79 y/o smoker with DM2, HTN, HL. He saw Dr. Graciela Husbands on 3/3 after having seen Dr. Freida Busman on 2/28 with a syncopal episode the day before that visit.  The patient has been wearing an event monitor. On Friday had recurrent syncope monitor showed 14.6 second episode of asystole with reported post event atrial fibrillation.  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 Admitted for monitoring over weekend and PPM on Monday.   Echocardiogram demonstrated NL LV systolic function with mild pulmonary HTN.   No pauses on tele. SB 50-60s. Feels fine.   Physical Exam: Blood pressure 179/62, pulse 64, temperature 98.1 F (36.7 C), temperature source Oral, resp. rate 22, height 5\' 10"  (1.778 m), weight 88.6 kg (195 lb 5.2 oz), SpO2 92.00%.  GENERAL:  Well appearing HEENT:  Normal NECK:  No jugular venous distention LUNGS:  Decreased breath sounds with mild expiratory wheezing.   HEART:  PMI not displaced or sustained,S1 and S2 within normal limits, no S3, no S4, no clicks, no rubs, no murmurs, distant heart sounds. Zoll in place. ABD:  Soft NT/ND. Good BS EXT:  no cyanosis no clubbing, tr edema SKIN:  No rashes no nodules NEURO:  Cranial nerves II through XII grossly intact, motor grossly intact throughout PSYCH:  Cognitively intact, oriented to person place and time  Labs: Lab Results  Component Value Date   BUN 19 08/21/2012   Lab Results  Component Value Date   CREATININE 0.82 08/21/2012   Lab Results  Component Value Date   NA 136 08/21/2012   K 3.8 08/21/2012   CL 96 08/21/2012   CO2 26 08/21/2012   Lab Results  Component Value Date   TROPONINI <0.30 08/22/2012   Lab Results  Component Value Date   WBC 11.3* 08/21/2012   HGB 12.6* 08/21/2012   HCT 37.3* 08/21/2012   MCV 89.7 08/21/2012   PLT 203 08/21/2012    Lab Results  Component Value Date   ALT 11 08/21/2012   AST 14 08/21/2012   ALKPHOS 43 08/21/2012   BILITOT 0.3 08/21/2012     Radiology:  CXR No acute cardiopulmonary process seen; lungs mildly hyperexpanded.  EKG:  Rate 79, RBBB, LAD, first degree AV block.  ASSESSMENT AND PLAN:    Syncope:  Stable. On board for [facility] tomorrow. Will makeNPO. Zoll's in place. Dr. Jenene Slicker note mentions he had some AF after his pause. But he does not carry a dx of this in past and has not had any here. Will hold off on anti-coagulation now.  HTN:  BP back up. Will restart amlodipine.   Diabetes:  Will hold glucophage for procedure.  Tobacco abuse:  He is not interested in quitting. In fact, he asked me for a cigarette.  Will start DVT prophylaxis.   Signed: Arvilla Meres MD 08/23/2012, 10:32 AM

## 2012-08-24 ENCOUNTER — Encounter (HOSPITAL_COMMUNITY)
Admission: EM | Disposition: A | Discharge status: Home / Self Care | Payer: Self-pay | Source: Home / Self Care | Attending: Cardiology

## 2012-08-24 ENCOUNTER — Telehealth: Payer: Self-pay | Admitting: Internal Medicine

## 2012-08-24 DIAGNOSIS — I442 Atrioventricular block, complete: Secondary | ICD-10-CM

## 2012-08-24 DIAGNOSIS — I469 Cardiac arrest, cause unspecified: Secondary | ICD-10-CM

## 2012-08-24 HISTORY — PX: PERMANENT PACEMAKER INSERTION: SHX5480

## 2012-08-24 LAB — GLUCOSE, CAPILLARY
Glucose-Capillary: 151 mg/dL — ABNORMAL HIGH (ref 70–99)
Glucose-Capillary: 128 mg/dL — ABNORMAL HIGH (ref 70–99)
Glucose-Capillary: 125 mg/dL — ABNORMAL HIGH (ref 70–99)

## 2012-08-24 SURGERY — PERMANENT PACEMAKER INSERTION
Anesthesia: LOCAL

## 2012-08-24 MED ORDER — SODIUM CHLORIDE 0.9 % IJ SOLN
3.0000 mL | Freq: Two times a day (BID) | INTRAMUSCULAR | Status: DC
  Administered 2012-08-24: 3 mL via INTRAVENOUS

## 2012-08-24 MED ORDER — SODIUM CHLORIDE 0.9 % IR SOLN
80.0000 mg | Status: DC
  Filled 2012-08-24: qty 2

## 2012-08-24 MED ORDER — CHLORHEXIDINE GLUCONATE 4 % EX LIQD: 60.0000 mL | Freq: Once | CUTANEOUS | Status: DC

## 2012-08-24 MED ORDER — LIDOCAINE HCL (PF) 1 % IJ SOLN
INTRAMUSCULAR | Status: AC
  Filled 2012-08-24: qty 60

## 2012-08-24 MED ORDER — CHLORHEXIDINE GLUCONATE 4 % EX LIQD
CUTANEOUS | Status: AC
  Filled 2012-08-24: qty 15

## 2012-08-24 MED ORDER — CEFAZOLIN SODIUM-DEXTROSE 2-3 GM-% IV SOLR
2.0000 g | INTRAVENOUS | Status: DC
  Filled 2012-08-24: qty 50

## 2012-08-24 MED ORDER — SODIUM CHLORIDE 0.9 % IV SOLN: INTRAVENOUS | Status: DC

## 2012-08-24 MED ORDER — FENTANYL CITRATE 0.05 MG/ML IJ SOLN
INTRAMUSCULAR | Status: AC
  Filled 2012-08-24: qty 2

## 2012-08-24 MED ORDER — SODIUM CHLORIDE 0.9 % IJ SOLN: 3.0000 mL | INTRAMUSCULAR | Status: DC | PRN

## 2012-08-24 MED ORDER — CEFAZOLIN SODIUM 1-5 GM-% IV SOLN
INTRAVENOUS | Status: AC
  Filled 2012-08-24: qty 100

## 2012-08-24 MED ORDER — SODIUM CHLORIDE 0.9 % IV SOLN: INTRAVENOUS | Status: AC

## 2012-08-24 MED ORDER — SODIUM CHLORIDE 0.9 % IV SOLN: 250.0000 mL | INTRAVENOUS | Status: DC

## 2012-08-24 MED ORDER — ACETAMINOPHEN 325 MG PO TABS: 325.0000 mg | ORAL_TABLET | ORAL | Status: DC | PRN

## 2012-08-24 MED ORDER — CEFAZOLIN SODIUM 1-5 GM-% IV SOLN
1.0000 g | Freq: Four times a day (QID) | INTRAVENOUS | Status: DC
  Administered 2012-08-24 – 2012-08-25 (×2): 1 g via INTRAVENOUS
  Filled 2012-08-24 (×3): qty 50

## 2012-08-24 MED ORDER — ONDANSETRON HCL 4 MG/2ML IJ SOLN: 4.0000 mg | Freq: Four times a day (QID) | INTRAMUSCULAR | Status: DC | PRN

## 2012-08-24 MED ORDER — ENOXAPARIN SODIUM 40 MG/0.4ML ~~LOC~~ SOLN
40.0000 mg | SUBCUTANEOUS | Status: DC
  Filled 2012-08-24: qty 0.4

## 2012-08-24 MED ORDER — CHLORHEXIDINE GLUCONATE 4 % EX LIQD
CUTANEOUS | Status: AC
  Filled 2012-08-24: qty 45

## 2012-08-24 MED ORDER — HEPARIN (PORCINE) IN NACL 2-0.9 UNIT/ML-% IJ SOLN
INTRAMUSCULAR | Status: AC
  Filled 2012-08-24: qty 500

## 2012-08-24 MED ORDER — MIDAZOLAM HCL 5 MG/5ML IJ SOLN
INTRAMUSCULAR | Status: AC
  Filled 2012-08-24: qty 5

## 2012-08-24 NOTE — CV Procedure (Signed)
Kyle [name] [id number]  [id number]  Preop Dx: intermittent asystole  Postop Dx same/  Procedure: dual chamber pacemaker insertion  After routine prep and drape, lidocaine was infiltrated in the prepectoral subclavicular region on the left side an incision was made and carried down to layer of  the prepectoral fascia using electrocautery and sharp dissection;  a pocket was formed similarly. Hemostasis was obtained.  After this, we turned our attention to gaining access to the extrathoracic,left subclavian vein. This was accomplished without difficulty and without the aspiration of air or puncture of the artery. 2 separate venipunctures were accomplished; guidewires were placed and retained and sequentially 7 French sheaths  were placed through which were passed a  St Jude  ventricular lead 1948, serial number [id number] and a   St Jude active fixation 2088 atrial lead, serial number [id number].  The ventricular lead was manipulated to the right ventricular apex where the bipolar R wave was 17, the pacing impedance was 755, the threshold was  0.6 V  @ 0.4 msec .  Current at threshold was   1.1 ma.  The right atrial lead was manipulated to the right atrial appendage  where the bipolar P-wave was 4.9, the pacing impedance was 590, the threshold was 2.2 V @ 0.4 msec.  Current at threshold was3.5  Ma.  Current of injury was brisk  The leads were affixed to the prepectoral fascia and attached to a  St Jude pm2110 pulse generator.  Serial number [id number]  Hemostasis was obtained. The pocket was copiously irrigated with antibiotic containing saline solution. The leads and the pulse generator were placed in the pocket and affixed to the prepectoral fascia. The wound iwas then closed in 2 layers in normal fashion. The wound was washed dried and a benzoin Steri-Strip dressing  was applied.  Needle count, sponge count  and instrument counts were correct at the end of the procedure .Marland Kitchen The patient tolerated the  procedure without apparent complication.  Duke Salvia M.D. 08/24/2012 4:41 PM

## 2012-08-24 NOTE — Care Management Note (Signed)
    Page 1 of 1   08/24/2012     11:10:29 AM   CARE MANAGEMENT NOTE 08/24/2012  Patient:  Kyle [name], Kyle [name]   Account Number:  [id number]  Date Initiated:  08/24/2012  Documentation initiated by:  Junius Creamer  Subjective/Objective Assessment:   adm w syncope, pauses noted on event moniter     Action/Plan:   lives w wife, pcp dr Moreen Fowler   Anticipated DC Date:     Anticipated DC Plan:        DC Planning Services  CM consult      Choice offered to / List presented to:             Status of service:   Medicare Important Message given?   (If response is "NO", the following Medicare IM given date fields will be blank) Date Medicare IM given:   Date Additional Medicare IM given:    Discharge Disposition:  HOME/SELF CARE  Per UR Regulation:  Reviewed for med. necessity/level of care/duration of stay  If discussed at Long Length of Stay Meetings, dates discussed:    Comments:  3/10 1109a debbie dowell rn,bsn

## 2012-08-24 NOTE — Progress Notes (Signed)
  Patient Name: Kyle [name]      SUBJECTIVE:*ready to get done   Denies chest pain or shortness   Past Medical History  Diagnosis Date  . Diabetes mellitus   . Hypertension   . Hyperlipidemia   . Glaucoma   . Elevated PSA   . Benign prostatic hypertrophy     PHYSICAL EXAM Filed Vitals:   08/24/12 0448 08/24/12 0757 08/24/12 0844 08/24/12 1225  BP: 142/66 159/57  142/50  Pulse: 66 60  62  Temp: 97 F (36.1 C) 97.7 F (36.5 C)  98.4 F (36.9 C)  TempSrc: Oral Oral  Oral  Resp: 25 17    Height:      Weight:      SpO2: 95% 93% 94% 98%    Well developed and nourished in no acute distress HENT normal Neck supple with JVP-flat Carotids brisk and full without bruits occ crackels  Clear with cough Regular rate and rhythm, no murmurs or gallops Abd-soft with active BS without hepatomegaly No Clubbing cyanosis edema Skin-warm and dry A & Oriented  Grossly normal sensory and motor function  TELEMETRY: Reviewed telemetry pt in NSR:    Intake/Output Summary (Last 24 hours) at 08/24/12 1300 Last data filed at 08/24/12 1235  Gross per 24 hour  Intake      0 ml  Output   1700 ml  Net  -1700 ml    LABS: Basic Metabolic Panel:  Recent Labs Lab 08/21/12 2135  NA 136  K 3.8  CL 96  CO2 26  GLUCOSE 219*  BUN 19  CREATININE 0.82  CALCIUM 9.8   Cardiac Enzymes:  Recent Labs  08/22/12 0008 08/22/12 0645 08/22/12 1248  TROPONINI <0.30 <0.30 <0.30   CBC:  Recent Labs Lab 08/21/12 2135  WBC 11.3*  NEUTROABS 7.1  HGB 12.6*  HCT 37.3*  MCV 89.7  PLT 203   PROTIME:  Recent Labs  08/24/12 1120  LABPROT 13.7  INR 1.06   Liver Function Tests:  Recent Labs  08/21/12 2135  AST 14  ALT 11  ALKPHOS 43  BILITOT 0.3  PROT 7.0  ALBUMIN 3.8   No results found for this basename: LIPASE, AMYLASE,  in the last 72 hours BNP: BNP (last 3 results) No results found for this basename: PROBNP,  in the last 8760 hours D-Dimer: No results found for  this basename: DDIMER,  in the last 72 hours Hemoglobin A1C: No results found for this basename: HGBA1C,  in the last 72 hours Fasting Lipid Panel: No results found for this basename: CHOL, HDL, LDLCALC, TRIG, CHOLHDL, LDLDIRECT,  in the last 72 hours Thyroid Function Tests:  Recent Labs  08/22/12 0007  TSH 1.417   Anemia Panel: No results found for this basename: VITAMINB12, FOLATE, FERRITIN, TIBC, IRON, RETICCTPCT,  in the last 72 hours     ASSESSMENT AND PLAN:  Active Problems:   Syncope   Trifascicular block-RBBB+LAFB   Asystole  With recurrent syncope in setting otrifasiculaer block and documented asystole on event recorder  For pacer today EF normal The benefits and risks were reviewed including but not limited to death,  perforation, infection, lead dislodgement and device malfunction.  The patient understands agrees and is willing to proceed.     Signed, Sherryl Manges MD  08/24/2012

## 2012-08-24 NOTE — Progress Notes (Signed)
Inpatient Diabetes Program Recommendations  AACE/ADA: New Consensus Statement on Inpatient Glycemic Control (2013)  Target Ranges:  Prepandial:   less than 140 mg/dL      Peak postprandial:   less than 180 mg/dL (1-2 hours)      Critically ill patients:  140 - 180 mg/dL   Inpatient Diabetes Program Recommendations Correction (SSI): Start Novolog sensitive scale Q 4 while NPO Discontinue Glucotrol while NPO. Thank you  Piedad Climes BSN, RN,CDE Inpatient Diabetes Coordinator [phone] (team pager)

## 2012-08-24 NOTE — Telephone Encounter (Signed)
Pt's son calling to see what time pt's pacemaker implant scheduled for today? pls call

## 2012-08-24 NOTE — Telephone Encounter (Signed)
Information provided.

## 2012-08-25 ENCOUNTER — Inpatient Hospital Stay (HOSPITAL_COMMUNITY): Payer: Medicare Other

## 2012-08-25 NOTE — Discharge Summary (Signed)
ELECTROPHYSIOLOGY DISCHARGE SUMMARY    Patient ID: Kyle [name],  MRN: [id number], DOB/AGE: 01/04/1926 79 y.o.  Admit date: 08/21/2012 Discharge date: 08/25/2012  Primary Care Physician: Eleonore Chiquito, MD  Primary Cardiologist: Berton Mount, MD  Primary Discharge Diagnosis:  1. Syncope with asystole documented on event monitor s/p PPM implantation yesterday 2. Trifascicular block   Secondary Discharge Diagnoses:  1. HTN 2. Dyslipidemia 3. DM 4. Glaucoma 5. BPH  Procedures This Admission:  1. Dual chamber PPM implantation 08/24/2012 RA lead - St Jude active fixation 2088 atrial lead, serial number [id number] RV lead - St Jude ventricular lead 1948, serial number [id number] PPM - St Jude PPM 2110 pulse generator, serial number [id number]  History and Hospital Course:  Kyle [name] is an 79 year old man with HTN, dyslipidemia and DM who was experiencing recurrent syncope and was given an event monitor for further evaluation. He experienced recurrent syncope with documented asystole, pause ~14 seconds in duration. He was subsequently admitted for PPM implantation. CEs negative. There were no reversible causes identified. He underwent dual chamber PPM implantation on 08/24/2012. Kyle [name] tolerated this procedure well without any immediate complication. He remains hemodynamically stable and afebrile. His chest xray shows stable lead placement without pneumothorax. His device interrogation shows normal PPM function with stable lead parameters/measurements. His implant site is intact without significant bleeding or hematoma. He has been given discharge instructions including wound care and activity restrictions. He will follow-up in 10 days for wound check. There were no changes made to his medications. He was instructed not to drive for 3 months, until seen in follow-up by Dr. Graciela Husbands. He has been seen, examined and deemed stable for discharge today by Dr. Berton Mount.  Physical Exam:    Vitals: Blood pressure 101/48, pulse 75, temperature 97.9 F (36.6 C), temperature source Oral, resp. rate 20, height 5\' 10"  (1.778 m), weight 195 lb 5.2 oz (88.6 kg), SpO2 94.00%.  General: Well developed, well appearing 79 year old male in no acute distress  Heart: Regular S1, S2 without murmur, rub or S3.  Lungs: CTA bilaterally without wheezes, rales or rhonchi.  Abdomen: Soft, nondistended.  Extremities: No cyanosis, clubbing or edema.  Skin: Left upper chest/implant site intact without significant bleeding or hematoma.  Labs: Lab Results  Component Value Date   WBC 11.3* 08/21/2012   HGB 12.6* 08/21/2012   HCT 37.3* 08/21/2012   MCV 89.7 08/21/2012   PLT 203 08/21/2012     Recent Labs Lab 08/21/12 2135  NA 136  K 3.8  CL 96  CO2 26  BUN 19  CREATININE 0.82  CALCIUM 9.8  PROT 7.0  BILITOT 0.3  ALKPHOS 43  ALT 11  AST 14  GLUCOSE 219*   Lab Results  Component Value Date   TROPONINI <0.30 08/22/2012     Recent Labs  08/24/12 1120  INR 1.06    Disposition:  The patient is being discharged in stable condition.  Follow-up:     Follow-up Information   Follow up with [facility] On 09/02/2012. (At 4:30 PM for wound check)    Contact information:   [address] [place] [address] [phone]      Follow up with Sherryl Manges, MD On 11/25/2012. (At 11:00 AM)    Contact information:   [address] [place] [address] [phone]     Discharge Medications:    Medication List    TAKE these medications  albuterol 90 MCG/ACT inhaler  Commonly known as:  PROVENTIL,VENTOLIN  Inhale 2 puffs into the lungs 2 (two) times daily.     amLODipine 10 MG tablet  Commonly known as:  NORVASC  TAKE 1 TABLET ONCE DAILY.     aspirin 325 MG tablet  Take 325 mg by mouth. 1/2 tab daily     doxazosin 4 MG tablet  Commonly known as:  CARDURA  One half tablet daily     glipiZIDE 5 MG tablet  Commonly known as:   GLUCOTROL  Take 1 tablet (5 mg total) by mouth 2 (two) times daily before a meal.     GLUCOPHAGE 1000 MG tablet  Generic drug:  metFORMIN  TAKE 1 TABLET TWICE DAILY.     hydrochlorothiazide 25 MG tablet  Commonly known as:  HYDRODIURIL  Take 1 tablet (25 mg total) by mouth daily.     latanoprost 0.005 % ophthalmic solution  Commonly known as:  XALATAN  1 drop at bedtime.     lisinopril 40 MG tablet  Commonly known as:  PRINIVIL,ZESTRIL  TAKE 1 TABLET ONCE DAILY.     naproxen 500 MG tablet  Commonly known as:  NAPROSYN  Take 500 mg by mouth daily as needed. For gout attack per patient     simvastatin 20 MG tablet  Commonly known as:  ZOCOR  TAKE ONE TABLET AT BEDTIME.       Duration of Discharge Encounter: Greater than 30 minutes including physician time.  Signed, Rick Duff, PA-C 08/25/2012, 9:45 AM

## 2012-08-25 NOTE — Progress Notes (Signed)
Discharged to home with family office visits in place teaching done  

## 2012-08-27 ENCOUNTER — Telehealth: Payer: Self-pay | Admitting: *Deleted

## 2012-08-27 NOTE — Telephone Encounter (Signed)
Please note;Kyle [name] had PPM implantation.

## 2012-08-27 NOTE — Telephone Encounter (Signed)
FYI :Discharged from hospital 08-25-2012 - had heart valve placement . Wife states he is doing fine, in fact better than before going to hosptial- has f/u with dr Graciela Husbands next week, and appt with dr Peter Garter.  They dont feel they need to see dr Kirtland Bouchard now and wants to wait until April appointment.

## 2012-08-27 NOTE — Telephone Encounter (Signed)
Agree April ok

## 2012-08-31 ENCOUNTER — Other Ambulatory Visit: Payer: Self-pay | Admitting: Internal Medicine

## 2012-09-01 ENCOUNTER — Institutional Professional Consult (permissible substitution): Payer: Medicare Other | Admitting: Internal Medicine

## 2012-09-02 ENCOUNTER — Ambulatory Visit (INDEPENDENT_AMBULATORY_CARE_PROVIDER_SITE_OTHER): Payer: Medicare Other | Admitting: *Deleted

## 2012-09-02 ENCOUNTER — Other Ambulatory Visit: Payer: Self-pay

## 2012-09-02 DIAGNOSIS — I469 Cardiac arrest, cause unspecified: Secondary | ICD-10-CM

## 2012-09-02 LAB — PACEMAKER DEVICE OBSERVATION
AL AMPLITUDE: 3.7 mv
AL IMPEDENCE PM: 437.5 Ohm
BAMS-0001: 170 {beats}/min
BAMS-0003: 70 {beats}/min
BATTERY VOLTAGE: 2.9328 V
VENTRICULAR PACING PM: 38

## 2012-09-02 NOTE — Progress Notes (Signed)
Wound check pacer in clinic  

## 2012-09-11 ENCOUNTER — Encounter: Payer: Self-pay | Admitting: Internal Medicine

## 2012-09-11 ENCOUNTER — Ambulatory Visit (INDEPENDENT_AMBULATORY_CARE_PROVIDER_SITE_OTHER): Payer: Medicare Other | Admitting: Internal Medicine

## 2012-09-11 VITALS — BP 150/66 | HR 75 | Temp 98.3°F | Resp 20 | Wt 205.0 lb

## 2012-09-11 DIAGNOSIS — I469 Cardiac arrest, cause unspecified: Secondary | ICD-10-CM

## 2012-09-11 DIAGNOSIS — E119 Type 2 diabetes mellitus without complications: Secondary | ICD-10-CM

## 2012-09-11 DIAGNOSIS — R55 Syncope and collapse: Secondary | ICD-10-CM

## 2012-09-11 DIAGNOSIS — I10 Essential (primary) hypertension: Secondary | ICD-10-CM

## 2012-09-11 NOTE — Patient Instructions (Signed)
Please check your hemoglobin A1c every 3 months  Limit your sodium (Salt) intake    It is important that you exercise regularly, at least 20 minutes 3 to 4 times per week.  If you develop chest pain or shortness of breath seek  medical attention.  You need to lose weight.  Consider a lower calorie diet and regular exercise. 

## 2012-09-11 NOTE — Progress Notes (Signed)
Subjective:    Patient ID: Kyle [name], male    DOB: [birth year], 79 y.o.   MRN: [id number]  HPI  79 year old patient who is seen today following her hospital discharge. The patient was last seen he presented with syncope it was felt to be cardiac in origin. He has a long history of a trifascicular block and had sudden syncopal episode while having breakfast. It was felt that he perhaps developed a high degree or complete AV block and was referred to cardiology. An event monitor actually confirmed prolonged asystole and the patient is now status post permanent pacemaker insertion. He has done quite well since his hospital discharge. Remarkably this was his first hospital admission since a childhood tonsillectomy History of hypertension and type 2 diabetes  Hospital records reviewed  Past Medical History  Diagnosis Date  . Diabetes mellitus   . Hypertension   . Hyperlipidemia   . Glaucoma   . Elevated PSA   . Benign prostatic hypertrophy     History   Social History  . Marital Status: Married    Spouse Name: N/A    Number of Children: 4  . Years of Education: N/A   Occupational History  . Not on file.   Social History Main Topics  . Smoking status: Current Every Day Smoker -- 0.50 packs/day for 71 years    Types: Cigarettes  . Smokeless tobacco: Never Used  . Alcohol Use: 1.8 oz/week    1 Glasses of wine, 2 Shots of liquor per week  . Drug Use: No  . Sexually Active: Not on file   Other Topics Concern  . Not on file   Social History Narrative   Lives at home with wife.      Past Surgical History  Procedure Laterality Date  . Tonsillectomy    . Cataract extraction, bilateral      Family History  Problem Relation Age of Onset  . Heart disease Mother     atrial fib  . Heart disease Father 28    MI  . Cancer Father     prostate    No Known Allergies  Current Outpatient Prescriptions on File Prior to Visit  Medication Sig Dispense Refill  . albuterol  (PROVENTIL,VENTOLIN) 90 MCG/ACT inhaler Inhale 2 puffs into the lungs 2 (two) times daily.        Marland Kitchen amLODipine (NORVASC) 10 MG tablet TAKE 1 TABLET ONCE DAILY.  90 tablet  1  . aspirin 325 MG tablet Take 325 mg by mouth. 1/2 tab daily       . doxazosin (CARDURA) 4 MG tablet One half tablet daily  30 tablet  11  . glipiZIDE (GLUCOTROL) 5 MG tablet Take 1 tablet (5 mg total) by mouth 2 (two) times daily before a meal.  60 tablet  3  . GLUCOPHAGE 1000 MG tablet TAKE 1 TABLET TWICE DAILY.  180 each  3  . hydrochlorothiazide (HYDRODIURIL) 25 MG tablet Take 1 tablet (25 mg total) by mouth daily.  90 tablet  3  . latanoprost (XALATAN) 0.005 % ophthalmic solution 1 drop at bedtime.      Marland Kitchen lisinopril (PRINIVIL,ZESTRIL) 40 MG tablet TAKE 1 TABLET ONCE DAILY.  90 tablet  1  . naproxen (NAPROSYN) 500 MG tablet Take 500 mg by mouth daily as needed. For gout attack per patient      . simvastatin (ZOCOR) 20 MG tablet TAKE ONE TABLET AT BEDTIME.  90 tablet  1   No current facility-administered medications  on file prior to visit.    BP 150/66  Pulse 75  Temp(Src) 98.3 F (36.8 C) (Oral)  Resp 20  Wt 205 lb (92.987 kg)  BMI 29.41 kg/m2  SpO2 94%       Review of Systems  Constitutional: Negative for fever, chills, appetite change and fatigue.  HENT: Negative for hearing loss, ear pain, congestion, sore throat, trouble swallowing, neck stiffness, dental problem, voice change and tinnitus.   Eyes: Negative for pain, discharge and visual disturbance.  Respiratory: Negative for cough, chest tightness, wheezing and stridor.   Cardiovascular: Negative for chest pain, palpitations and leg swelling.  Gastrointestinal: Negative for nausea, vomiting, abdominal pain, diarrhea, constipation, blood in stool and abdominal distention.  Genitourinary: Negative for urgency, hematuria, flank pain, discharge, difficulty urinating and genital sores.  Musculoskeletal: Negative for myalgias, back pain, joint swelling,  arthralgias and gait problem.  Skin: Negative for rash.  Neurological: Negative for dizziness, syncope, speech difficulty, weakness, numbness and headaches.  Hematological: Negative for adenopathy. Does not bruise/bleed easily.  Psychiatric/Behavioral: Negative for behavioral problems and dysphoric mood. The patient is not nervous/anxious.        Objective:   Physical Exam  Constitutional: He is oriented to person, place, and time. He appears well-developed.  HENT:  Head: Normocephalic.  Right Ear: External ear normal.  Left Ear: External ear normal.  Eyes: Conjunctivae and EOM are normal.  Neck: Normal range of motion.  Cardiovascular: Normal rate and normal heart sounds.   Pulmonary/Chest: Breath sounds normal.  Pacemaker noted left upper anterior chest wall region  Abdominal: Bowel sounds are normal.  Musculoskeletal: Normal range of motion. He exhibits no edema and no tenderness.  Neurological: He is alert and oriented to person, place, and time.  Psychiatric: He has a normal mood and affect. His behavior is normal.          Assessment & Plan:   Syncope status post permanent pacemaker insertion Diabetes mellitus. Weight loss exercise encouraged. We'll check a hemoglobin A1c in 3 months Hypertension stable Dyslipidemia

## 2012-09-15 ENCOUNTER — Ambulatory Visit: Payer: Medicare Other | Admitting: Internal Medicine

## 2012-10-07 ENCOUNTER — Ambulatory Visit: Payer: Medicare Other | Admitting: Internal Medicine

## 2012-10-12 ENCOUNTER — Ambulatory Visit: Payer: Medicare Other | Admitting: Internal Medicine

## 2012-11-03 ENCOUNTER — Other Ambulatory Visit: Payer: Self-pay | Admitting: Internal Medicine

## 2012-11-25 ENCOUNTER — Encounter: Payer: Self-pay | Admitting: Internal Medicine

## 2012-11-25 ENCOUNTER — Ambulatory Visit (INDEPENDENT_AMBULATORY_CARE_PROVIDER_SITE_OTHER): Payer: Medicare Other | Admitting: Internal Medicine

## 2012-11-25 VITALS — BP 131/85 | HR 61 | Ht 70.0 in | Wt 203.0 lb

## 2012-11-25 DIAGNOSIS — R55 Syncope and collapse: Secondary | ICD-10-CM

## 2012-11-25 DIAGNOSIS — R609 Edema, unspecified: Secondary | ICD-10-CM

## 2012-11-25 DIAGNOSIS — I4891 Unspecified atrial fibrillation: Secondary | ICD-10-CM

## 2012-11-25 LAB — PACEMAKER DEVICE OBSERVATION
AL AMPLITUDE: 3.4 mv
BAMS-0001: 170 {beats}/min
DEVICE MODEL PM: 7379731
RV LEAD AMPLITUDE: 12 mv
RV LEAD THRESHOLD: 0.5 V

## 2012-11-25 MED ORDER — FUROSEMIDE 20 MG PO TABS: 20.0000 mg | ORAL_TABLET | Freq: Every day | ORAL | Status: DC

## 2012-11-25 NOTE — Assessment & Plan Note (Signed)
Stable post pacing 

## 2012-11-25 NOTE — Progress Notes (Signed)
Patient Care Team: Gordy Savers, MD as PCP - General   HPI  Kyle [name] is a 79 y.o. male Seen in followup for syncope with trifascicular block associated with documented asystole. He underwent pacemaker implantation 3/14  He has had no recurrent syncope. He has modest exercise intolerance and peripheral edema  Past Medical History  Diagnosis Date  . Diabetes mellitus   . Hypertension   . Hyperlipidemia   . Glaucoma   . Elevated PSA   . Benign prostatic hypertrophy   . Trifascicular block   . Intermittent complete heart block   . Pacemaker   . PAF (paroxysmal atrial fibrillation)     Detected on pacemaker; the duration 44 seconds    Past Surgical History  Procedure Laterality Date  . Tonsillectomy    . Cataract extraction, bilateral      Current Outpatient Prescriptions  Medication Sig Dispense Refill  . albuterol (PROVENTIL,VENTOLIN) 90 MCG/ACT inhaler Inhale 2 puffs into the lungs 2 (two) times daily.        Marland Kitchen amLODipine (NORVASC) 10 MG tablet TAKE 1 TABLET ONCE DAILY.  90 tablet  1  . aspirin 325 MG tablet 1/2 tab daily      . doxazosin (CARDURA) 4 MG tablet One half tablet daily  30 tablet  11  . glipiZIDE (GLUCOTROL) 5 MG tablet TAKE (1) TABLET TWICE A DAY BEFORE MEALS.  60 tablet  3  . GLUCOPHAGE 1000 MG tablet TAKE 1 TABLET TWICE DAILY.  180 each  3  . hydrochlorothiazide (HYDRODIURIL) 25 MG tablet Take 1 tablet (25 mg total) by mouth daily.  90 tablet  3  . latanoprost (XALATAN) 0.005 % ophthalmic solution 1 drop at bedtime.      Marland Kitchen lisinopril (PRINIVIL,ZESTRIL) 40 MG tablet TAKE 1 TABLET ONCE DAILY.  90 tablet  1  . naproxen (NAPROSYN) 500 MG tablet Take 500 mg by mouth daily as needed. For gout attack per patient      . simvastatin (ZOCOR) 20 MG tablet TAKE ONE TABLET AT BEDTIME.  90 tablet  1   No current facility-administered medications for this visit.    No Known Allergies  Review of Systems negative except from HPI and PMH  Physical  Exam BP 131/85  Pulse 61  Ht 5\' 10"  (1.778 m)  Wt 203 lb (92.08 kg)  BMI 29.13 kg/m2 Well developed and well nourished in no acute distress HENT normal E scleral and icterus clear Neck Supple JVP f7 t; carotids brisk and full Clear to ausculation Regular rate and rhythm, early systolic murmur and an S4Soft with active bowel sounds No clubbing cyanosis 2+ Edema Alert and oriented, grossly normal motor and sensory function Skin Warm and Dry  Atrial pacing with intrinsic conduction with right bundle left anterior fascicular block  Assessment and  Plan

## 2012-11-25 NOTE — Assessment & Plan Note (Signed)
No recurrent syncope 

## 2012-11-25 NOTE — Assessment & Plan Note (Signed)
Ongoing problem with significant edema. We will change his HydroDIURIL 2 Lasix 20 mg daily. He is to see Dr. Kirtland Bouchard  in 2 weeks time. At that time he will need a metabolic profile; these were normal when reviewed in March.

## 2012-11-25 NOTE — Assessment & Plan Note (Signed)
He has nonsustained atrial fibrillation detected on his device. It is less than 1 minute duration. We'll plan to discontinue his aspirin. We'll follow the atrial fibrillation long as to need for possible anticoagulant

## 2012-11-25 NOTE — Patient Instructions (Addendum)
Your physician has recommended you make the following change in your medication: stop taking Aspirin and Hydrochlorothiazide and start taking Lasix 20 mg daily  Your physician recommends that you return for lab work in: 2 weeks  Your physician recommends that you schedule a follow-up appointment in: 3 month for device clinic

## 2012-11-30 ENCOUNTER — Other Ambulatory Visit: Payer: Self-pay | Admitting: Internal Medicine

## 2012-12-09 ENCOUNTER — Other Ambulatory Visit: Payer: Self-pay | Admitting: Internal Medicine

## 2012-12-09 ENCOUNTER — Other Ambulatory Visit (INDEPENDENT_AMBULATORY_CARE_PROVIDER_SITE_OTHER): Payer: Medicare Other

## 2012-12-09 DIAGNOSIS — R55 Syncope and collapse: Secondary | ICD-10-CM

## 2012-12-09 LAB — BASIC METABOLIC PANEL
BUN: 13 mg/dL (ref 6–23)
Calcium: 9.7 mg/dL (ref 8.4–10.5)
Creatinine, Ser: 0.8 mg/dL (ref 0.4–1.5)
GFR: 102.07 mL/min (ref >60.00)

## 2012-12-10 ENCOUNTER — Ambulatory Visit (INDEPENDENT_AMBULATORY_CARE_PROVIDER_SITE_OTHER): Payer: Medicare Other | Admitting: Internal Medicine

## 2012-12-10 ENCOUNTER — Encounter: Payer: Self-pay | Admitting: Internal Medicine

## 2012-12-10 VITALS — BP 120/60 | HR 69 | Temp 98.4°F | Resp 20 | Wt 206.0 lb

## 2012-12-10 DIAGNOSIS — I10 Essential (primary) hypertension: Secondary | ICD-10-CM

## 2012-12-10 DIAGNOSIS — E119 Type 2 diabetes mellitus without complications: Secondary | ICD-10-CM

## 2012-12-10 LAB — HEMOGLOBIN A1C: Hgb A1c MFr Bld: 7.3 % — ABNORMAL HIGH (ref 4.6–6.5)

## 2012-12-10 NOTE — Patient Instructions (Signed)
Limit your sodium (Salt) intake   Please check your hemoglobin A1c every 3 months    It is important that you exercise regularly, at least 20 minutes 3 to 4 times per week.  If you develop chest pain or shortness of breath seek  medical attention.  You need to lose weight.  Consider a lower calorie diet and regular exercise. 

## 2012-12-10 NOTE — Progress Notes (Signed)
Subjective:    Patient ID: Kyle [name], male    DOB: 02-03-1926, 79 y.o.   MRN: [id number]  HPI  79 year old patient who is seen today for followup. He is seen by cardiology recently for a pacemaker check. Medical illnesses include type 2 diabetes and hypertension. He is doing quite well today. No recent hemoglobin A 1C  Past Medical History  Diagnosis Date  . Diabetes mellitus   . Hypertension   . Hyperlipidemia   . Glaucoma   . Elevated PSA   . Benign prostatic hypertrophy   . Trifascicular block   . Intermittent complete heart block   . Pacemaker   . PAF (paroxysmal atrial fibrillation)     Detected on pacemaker; the duration 44 seconds    History   Social History  . Marital Status: Married    Spouse Name: N/A    Number of Children: 4  . Years of Education: N/A   Occupational History  . Not on file.   Social History Main Topics  . Smoking status: Current Every Day Smoker -- 0.50 packs/day for 71 years    Types: Cigarettes  . Smokeless tobacco: Never Used  . Alcohol Use: 1.8 oz/week    1 Glasses of wine, 2 Shots of liquor per week  . Drug Use: No  . Sexually Active: Not on file   Other Topics Concern  . Not on file   Social History Narrative   Lives at home with wife.      Past Surgical History  Procedure Laterality Date  . Tonsillectomy    . Cataract extraction, bilateral      Family History  Problem Relation Age of Onset  . Heart disease Mother     atrial fib  . Heart disease Father 3    MI  . Cancer Father     prostate    No Known Allergies  Current Outpatient Prescriptions on File Prior to Visit  Medication Sig Dispense Refill  . albuterol (PROVENTIL,VENTOLIN) 90 MCG/ACT inhaler Inhale 2 puffs into the lungs 2 (two) times daily.        Marland Kitchen amLODipine (NORVASC) 10 MG tablet TAKE 1 TABLET ONCE DAILY.  90 tablet  1  . doxazosin (CARDURA) 4 MG tablet TAKE (1/2) TABLET DAILY.  30 tablet  2  . furosemide (LASIX) 20 MG tablet Take 1 tablet (20  mg total) by mouth daily.  90 tablet  3  . glipiZIDE (GLUCOTROL) 5 MG tablet TAKE (1) TABLET TWICE A DAY BEFORE MEALS.  60 tablet  3  . latanoprost (XALATAN) 0.005 % ophthalmic solution 1 drop at bedtime.      Marland Kitchen lisinopril (PRINIVIL,ZESTRIL) 40 MG tablet TAKE 1 TABLET ONCE DAILY.  90 tablet  1  . metFORMIN (GLUCOPHAGE) 1000 MG tablet TAKE 1 TABLET TWICE DAILY.  180 tablet  1  . naproxen (NAPROSYN) 500 MG tablet Take 500 mg by mouth daily as needed. For gout attack per patient      . simvastatin (ZOCOR) 20 MG tablet TAKE ONE TABLET AT BEDTIME.  90 tablet  1   No current facility-administered medications on file prior to visit.    BP 120/60  Pulse 69  Temp(Src) 98.4 F (36.9 C) (Oral)  Resp 20  Wt 206 lb (93.441 kg)  BMI 29.56 kg/m2  SpO2 93%       Review of Systems  Constitutional: Negative for fever, chills, appetite change and fatigue.  HENT: Negative for hearing loss, ear pain, congestion, sore throat,  trouble swallowing, neck stiffness, dental problem, voice change and tinnitus.   Eyes: Negative for pain, discharge and visual disturbance.  Respiratory: Negative for cough, chest tightness, wheezing and stridor.   Cardiovascular: Negative for chest pain, palpitations and leg swelling.  Gastrointestinal: Negative for nausea, vomiting, abdominal pain, diarrhea, constipation, blood in stool and abdominal distention.  Genitourinary: Negative for urgency, hematuria, flank pain, discharge, difficulty urinating and genital sores.  Musculoskeletal: Negative for myalgias, back pain, joint swelling, arthralgias and gait problem.  Skin: Negative for rash.  Neurological: Negative for dizziness, syncope, speech difficulty, weakness, numbness and headaches.  Hematological: Negative for adenopathy. Does not bruise/bleed easily.  Psychiatric/Behavioral: Negative for behavioral problems and dysphoric mood. The patient is not nervous/anxious.        Objective:   Physical Exam   Constitutional: He is oriented to person, place, and time. He appears well-developed.  HENT:  Head: Normocephalic.  Right Ear: External ear normal.  Left Ear: External ear normal.  Eyes: Conjunctivae and EOM are normal.  Neck: Normal range of motion.  Cardiovascular: Normal rate and normal heart sounds.   Pulmonary/Chest: Breath sounds normal.  Abdominal: Bowel sounds are normal.  Musculoskeletal: Normal range of motion. He exhibits no edema and no tenderness.  Neurological: He is alert and oriented to person, place, and time.  Psychiatric: He has a normal mood and affect. His behavior is normal.          Assessment & Plan:   Diabetes mellitus. Will check a hemoglobin A1c. Exercise weight loss encouraged Hypertension well controlled. No change therapy  Recheck 3 months

## 2012-12-11 ENCOUNTER — Telehealth: Payer: Self-pay | Admitting: Internal Medicine

## 2012-12-11 ENCOUNTER — Ambulatory Visit: Payer: Medicare Other | Admitting: Internal Medicine

## 2012-12-11 NOTE — Telephone Encounter (Signed)
New Problem  Pt is calling in regards to his lab work her had done on Wednesday.

## 2012-12-11 NOTE — Telephone Encounter (Signed)
Called patient with lab results.  

## 2013-02-18 ENCOUNTER — Other Ambulatory Visit: Payer: Self-pay | Admitting: Internal Medicine

## 2013-02-21 ENCOUNTER — Other Ambulatory Visit: Payer: Self-pay | Admitting: Internal Medicine

## 2013-02-23 ENCOUNTER — Other Ambulatory Visit: Payer: Self-pay | Admitting: *Deleted

## 2013-02-23 NOTE — Telephone Encounter (Signed)
Refill done for 90 day supply.

## 2013-02-26 ENCOUNTER — Other Ambulatory Visit: Payer: Self-pay | Admitting: *Deleted

## 2013-02-26 MED ORDER — NAPROXEN 500 MG PO TABS: ORAL_TABLET | ORAL | Status: DC

## 2013-03-11 ENCOUNTER — Ambulatory Visit (INDEPENDENT_AMBULATORY_CARE_PROVIDER_SITE_OTHER): Payer: Medicare Other | Admitting: *Deleted

## 2013-03-11 DIAGNOSIS — R55 Syncope and collapse: Secondary | ICD-10-CM

## 2013-03-11 DIAGNOSIS — I453 Trifascicular block: Secondary | ICD-10-CM

## 2013-03-11 LAB — PACEMAKER DEVICE OBSERVATION
AL AMPLITUDE: 3.6 mv
AL IMPEDENCE PM: 375 Ohm
BAMS-0003: 70 {beats}/min
BATTERY VOLTAGE: 2.9629 V
RV LEAD AMPLITUDE: 12 mv

## 2013-03-11 NOTE — Progress Notes (Signed)
Pacemaker check in clinic. He was seen in June and has returned today for evaluation of % of A- fib compared to last visit.  No mode switches.  One 4 second run of PAF. Compared with his visit 11/25/12 of 2 mode switch episodes, <1%.    Normal device function. Thresholds, sensing, impedances consistent with previous measurements. Device programmed to maximize longevity.   No high ventricular rates noted. Device programmed at appropriate safety margins. Histogram distribution appropriate for patient activity level. Device programmed to optimize intrinsic conduction. Estimated longevity 8.4 years.  Patient education completed.  ROV in March with Dr. Graciela Husbands.

## 2013-03-12 ENCOUNTER — Encounter: Payer: Self-pay | Admitting: Internal Medicine

## 2013-03-12 ENCOUNTER — Ambulatory Visit (INDEPENDENT_AMBULATORY_CARE_PROVIDER_SITE_OTHER): Payer: Medicare Other | Admitting: Internal Medicine

## 2013-03-12 VITALS — BP 152/76 | HR 66 | Temp 98.7°F | Resp 20 | Wt 206.0 lb

## 2013-03-12 DIAGNOSIS — Z23 Encounter for immunization: Secondary | ICD-10-CM

## 2013-03-12 DIAGNOSIS — J449 Chronic obstructive pulmonary disease, unspecified: Secondary | ICD-10-CM

## 2013-03-12 DIAGNOSIS — E785 Hyperlipidemia, unspecified: Secondary | ICD-10-CM

## 2013-03-12 DIAGNOSIS — E119 Type 2 diabetes mellitus without complications: Secondary | ICD-10-CM

## 2013-03-12 DIAGNOSIS — I10 Essential (primary) hypertension: Secondary | ICD-10-CM

## 2013-03-12 MED ORDER — GLIPIZIDE 5 MG PO TABS: ORAL_TABLET | ORAL | Status: DC

## 2013-03-12 MED ORDER — SIMVASTATIN 20 MG PO TABS: ORAL_TABLET | ORAL | Status: DC

## 2013-03-12 MED ORDER — AMLODIPINE BESYLATE 5 MG PO TABS: 5.0000 mg | ORAL_TABLET | Freq: Every day | ORAL | Status: DC

## 2013-03-12 MED ORDER — METFORMIN HCL 1000 MG PO TABS: ORAL_TABLET | ORAL | Status: DC

## 2013-03-12 MED ORDER — LISINOPRIL 40 MG PO TABS: ORAL_TABLET | ORAL | Status: DC

## 2013-03-12 MED ORDER — DOXAZOSIN MESYLATE 4 MG PO TABS: ORAL_TABLET | ORAL | Status: DC

## 2013-03-12 MED ORDER — FUROSEMIDE 20 MG PO TABS: 20.0000 mg | ORAL_TABLET | Freq: Every day | ORAL | Status: DC

## 2013-03-12 NOTE — Progress Notes (Signed)
Subjective:    Patient ID: Kyle [name], male    DOB: 08-02-1925, 79 y.o.   MRN: [id number]  HPI   79 year old patient who is seen today for his quarterly followup. He has a history of type 2 diabetes which has been managed on oral medications. His last hemoglobin A1c 7.3. No hypoglycemia. He has history of COPD and ongoing tobacco use. For the past few days he has had some increased chest congestion and cough but today he states he feels improved. No fever wheezing or shortness of breath He has treated hypertension and dyslipidemia. He is status post permanent pacemaker insertion. Remains on simvastatin for dyslipidemia.  His last eye examination was 3-4 months ago  Past Medical History  Diagnosis Date  . Diabetes mellitus   . Hypertension   . Hyperlipidemia   . Glaucoma   . Elevated PSA   . Benign prostatic hypertrophy   . Trifascicular block   . Intermittent complete heart block   . Pacemaker   . PAF (paroxysmal atrial fibrillation)     Detected on pacemaker; the duration 44 seconds    History   Social History  . Marital Status: Married    Spouse Name: N/A    Number of Children: 4  . Years of Education: N/A   Occupational History  . Not on file.   Social History Main Topics  . Smoking status: Current Every Day Smoker -- 0.50 packs/day for 71 years    Types: Cigarettes  . Smokeless tobacco: Never Used  . Alcohol Use: 1.8 oz/week    1 Glasses of wine, 2 Shots of liquor per week  . Drug Use: No  . Sexual Activity: Not on file   Other Topics Concern  . Not on file   Social History Narrative   Lives at home with wife.      Past Surgical History  Procedure Laterality Date  . Tonsillectomy    . Cataract extraction, bilateral      Family History  Problem Relation Age of Onset  . Heart disease Mother     atrial fib  . Heart disease Father 8    MI  . Cancer Father     prostate    No Known Allergies  Current Outpatient Prescriptions on File Prior to  Visit  Medication Sig Dispense Refill  . albuterol (PROVENTIL,VENTOLIN) 90 MCG/ACT inhaler Inhale 2 puffs into the lungs 2 (two) times daily.        Marland Kitchen amLODipine (NORVASC) 10 MG tablet TAKE 1 TABLET ONCE DAILY.  90 tablet  1  . doxazosin (CARDURA) 4 MG tablet TAKE (1/2) TABLET DAILY.  30 tablet  2  . furosemide (LASIX) 20 MG tablet Take 1 tablet (20 mg total) by mouth daily.  90 tablet  3  . glipiZIDE (GLUCOTROL) 5 MG tablet TAKE (1) TABLET TWICE A DAY BEFORE MEALS.  60 tablet  3  . latanoprost (XALATAN) 0.005 % ophthalmic solution 1 drop at bedtime.      Marland Kitchen lisinopril (PRINIVIL,ZESTRIL) 40 MG tablet TAKE 1 TABLET ONCE DAILY.  90 tablet  1  . metFORMIN (GLUCOPHAGE) 1000 MG tablet TAKE 1 TABLET TWICE DAILY.  180 tablet  1  . naproxen (NAPROSYN) 500 MG tablet TAKE (1) TABLET TWICE A DAY AS NEEDED.  180 tablet  3  . simvastatin (ZOCOR) 20 MG tablet TAKE ONE TABLET AT BEDTIME.  90 tablet  1   No current facility-administered medications on file prior to visit.    BP 152/76  Pulse 66  Temp(Src) 98.7 F (37.1 C) (Oral)  Resp 20  Wt 206 lb (93.441 kg)  BMI 29.56 kg/m2  SpO2 93%       Review of Systems  Constitutional: Positive for fatigue. Negative for fever, chills and appetite change.  HENT: Negative for hearing loss, ear pain, congestion, sore throat, trouble swallowing, neck stiffness, dental problem, voice change and tinnitus.   Eyes: Negative for pain, discharge and visual disturbance.  Respiratory: Positive for cough. Negative for chest tightness, wheezing and stridor.   Cardiovascular: Negative for chest pain, palpitations and leg swelling.  Gastrointestinal: Negative for nausea, vomiting, abdominal pain, diarrhea, constipation, blood in stool and abdominal distention.  Genitourinary: Negative for urgency, hematuria, flank pain, discharge, difficulty urinating and genital sores.  Musculoskeletal: Negative for myalgias, back pain, joint swelling, arthralgias and gait problem.   Skin: Negative for rash.  Neurological: Negative for dizziness, syncope, speech difficulty, weakness, numbness and headaches.  Hematological: Negative for adenopathy. Does not bruise/bleed easily.  Psychiatric/Behavioral: Negative for behavioral problems and dysphoric mood. The patient is not nervous/anxious.        Objective:   Physical Exam  Constitutional: He is oriented to person, place, and time. He appears well-developed.  Repeat blood pressure 130/60  HENT:  Head: Normocephalic.  Right Ear: External ear normal.  Left Ear: External ear normal.  Eyes: Conjunctivae and EOM are normal.  Neck: Normal range of motion.  Cardiovascular: Normal rate and normal heart sounds.   Pulmonary/Chest: Breath sounds normal. No respiratory distress. He has no wheezes. He has no rales.  Abdominal: Bowel sounds are normal.  Musculoskeletal: Normal range of motion. He exhibits edema. He exhibits no tenderness.  +2 lower extremity edema  Neurological: He is alert and oriented to person, place, and time.  Psychiatric: He has a normal mood and affect. His behavior is normal.          Assessment & Plan:   Viral URI. Patient is improved today we'll continue symptomatic treatment with Mucinex DM. Smoking cessation encouraged Diabetes mellitus. Last hemoglobin A1c 7.3. We'll repeat Hypertension stable Dyslipidemia COPD  Recheck 3 months

## 2013-03-12 NOTE — Patient Instructions (Addendum)
Acute bronchitis symptoms for less than 10 days are generally not helped by antibiotics.  Take over-the-counter expectorants and cough medications such as  Mucinex DM.  Call if there is no improvement in 5 to 7 days or if he developed worsening cough, fever, or new symptoms, such as shortness of breath or chest pain.   Please check your hemoglobin A1c every 3 months  Limit your sodium (Salt) intake  Decrease amlodipine to 5 mg daily

## 2013-03-22 ENCOUNTER — Other Ambulatory Visit: Payer: Self-pay | Admitting: Internal Medicine

## 2013-03-25 ENCOUNTER — Encounter: Payer: Self-pay | Admitting: Internal Medicine

## 2013-06-14 ENCOUNTER — Encounter: Payer: Self-pay | Admitting: Internal Medicine

## 2013-06-14 ENCOUNTER — Ambulatory Visit (INDEPENDENT_AMBULATORY_CARE_PROVIDER_SITE_OTHER): Payer: Medicare Other | Admitting: Internal Medicine

## 2013-06-14 VITALS — BP 160/68 | Temp 97.7°F | Wt 200.0 lb

## 2013-06-14 DIAGNOSIS — I10 Essential (primary) hypertension: Secondary | ICD-10-CM

## 2013-06-14 DIAGNOSIS — E785 Hyperlipidemia, unspecified: Secondary | ICD-10-CM

## 2013-06-14 DIAGNOSIS — E119 Type 2 diabetes mellitus without complications: Secondary | ICD-10-CM

## 2013-06-14 DIAGNOSIS — J4489 Other specified chronic obstructive pulmonary disease: Secondary | ICD-10-CM

## 2013-06-14 DIAGNOSIS — Z23 Encounter for immunization: Secondary | ICD-10-CM

## 2013-06-14 DIAGNOSIS — J449 Chronic obstructive pulmonary disease, unspecified: Secondary | ICD-10-CM

## 2013-06-14 LAB — HEMOGLOBIN A1C: Hgb A1c MFr Bld: 7.4 % — ABNORMAL HIGH (ref 4.6–6.5)

## 2013-06-14 NOTE — Progress Notes (Signed)
Subjective:    Patient ID: Kyle [name], male    DOB: [birth year], 79 y.o.   MRN: [id number]  HPI   79 year old patient who is seen today for his quarterly followup. He has history of type 2 diabetes controlled on combination oral medication. He states fasting blood sugars are generally in the 1:30 range. Her random blood sugar today 268. He has been noncompliant with his diet over the holidays. He has history of COPD and ongoing tobacco use. No pulmonary complaints. History of hypertension as well as a history of atrial fibrillation.. Additional problems include history of OSA.  On examination 2 weeks ago  Past Medical History  Diagnosis Date  . Diabetes mellitus   . Hypertension   . Hyperlipidemia   . Glaucoma   . Elevated PSA   . Benign prostatic hypertrophy   . Trifascicular block   . Intermittent complete heart block   . Pacemaker   . PAF (paroxysmal atrial fibrillation)     Detected on pacemaker; the duration 44 seconds    History   Social History  . Marital Status: Married    Spouse Name: N/A    Number of Children: 4  . Years of Education: N/A   Occupational History  . Not on file.   Social History Main Topics  . Smoking status: Current Every Day Smoker -- 0.50 packs/day for 71 years    Types: Cigarettes  . Smokeless tobacco: Never Used  . Alcohol Use: 1.8 oz/week    1 Glasses of wine, 2 Shots of liquor per week  . Drug Use: No  . Sexual Activity: Not on file   Other Topics Concern  . Not on file   Social History Narrative   Lives at home with wife.      Past Surgical History  Procedure Laterality Date  . Tonsillectomy    . Cataract extraction, bilateral      Family History  Problem Relation Age of Onset  . Heart disease Mother     atrial fib  . Heart disease Father 80    MI  . Cancer Father     prostate    No Known Allergies  Current Outpatient Prescriptions on File Prior to Visit  Medication Sig Dispense Refill  . albuterol  (PROVENTIL,VENTOLIN) 90 MCG/ACT inhaler Inhale 2 puffs into the lungs 2 (two) times daily.        Marland Kitchen amLODipine (NORVASC) 5 MG tablet Take 1 tablet (5 mg total) by mouth daily.  90 tablet  3  . doxazosin (CARDURA) 4 MG tablet TAKE (1/2) TABLET DAILY.  90 tablet  2  . furosemide (LASIX) 20 MG tablet Take 1 tablet (20 mg total) by mouth daily.  90 tablet  3  . glipiZIDE (GLUCOTROL) 5 MG tablet TAKE (1) TABLET TWICE A DAY BEFORE MEALS.  60 tablet  5  . latanoprost (XALATAN) 0.005 % ophthalmic solution 1 drop at bedtime.      Marland Kitchen lisinopril (PRINIVIL,ZESTRIL) 40 MG tablet TAKE 1 TABLET ONCE DAILY.  90 tablet  5  . metFORMIN (GLUCOPHAGE) 1000 MG tablet TAKE 1 TABLET TWICE DAILY.  180 tablet  5  . naproxen (NAPROSYN) 500 MG tablet TAKE (1) TABLET TWICE A DAY AS NEEDED.  180 tablet  3  . simvastatin (ZOCOR) 20 MG tablet TAKE ONE TABLET AT BEDTIME.  90 tablet  5   No current facility-administered medications on file prior to visit.    BP 160/68  Temp(Src) 97.7 F (36.5 C) (Oral)  Wt 200 lb (90.719 kg)       Review of Systems  Constitutional: Negative for fever, chills, appetite change and fatigue.  HENT: Negative for congestion, dental problem, ear pain, hearing loss, sore throat, tinnitus, trouble swallowing and voice change.   Eyes: Negative for pain, discharge and visual disturbance.  Respiratory: Negative for cough, chest tightness, wheezing and stridor.   Cardiovascular: Negative for chest pain, palpitations and leg swelling.  Gastrointestinal: Negative for nausea, vomiting, abdominal pain, diarrhea, constipation, blood in stool and abdominal distention.  Genitourinary: Negative for urgency, hematuria, flank pain, discharge, difficulty urinating and genital sores.  Musculoskeletal: Negative for arthralgias, back pain, gait problem, joint swelling, myalgias and neck stiffness.  Skin: Negative for rash.  Neurological: Negative for dizziness, syncope, speech difficulty, weakness, numbness  and headaches.  Hematological: Negative for adenopathy. Does not bruise/bleed easily.  Psychiatric/Behavioral: Negative for behavioral problems and dysphoric mood. The patient is not nervous/anxious.        Objective:   Physical Exam  Constitutional: He is oriented to person, place, and time. He appears well-developed.  HENT:  Head: Normocephalic.  Right Ear: External ear normal.  Left Ear: External ear normal.  Eyes: Conjunctivae and EOM are normal.  Neck: Normal range of motion.  Cardiovascular: Normal rate and normal heart sounds.   Frequent ectopics  Pulmonary/Chest: Breath sounds normal.  Abdominal: Bowel sounds are normal.  Musculoskeletal: Normal range of motion. He exhibits no edema and no tenderness.  Neurological: He is alert and oriented to person, place, and time.  Psychiatric: He has a normal mood and affect. His behavior is normal.          Assessment & Plan:   Diabetes mellitus. We'll check a hemoglobin A1c Hypertension well controlled. Repeat blood pressure 140/62 COPD Ongoing tobacco use. Smoking cessation encouraged  Recheck 3 months Medications updated

## 2013-06-14 NOTE — Patient Instructions (Addendum)
Limit your sodium (Salt) intake   Please check your hemoglobin A1c every 3 months    It is important that you exercise regularly, at least 20 minutes 3 to 4 times per week.  If you develop chest pain or shortness of breath seek  medical attention.  You need to lose weight.  Consider a lower calorie diet and regular exercise.  Smoking tobacco is very bad for your health. You should stop smoking immediately. 

## 2013-06-14 NOTE — Progress Notes (Signed)
Pre visit review using our clinic review tool, if applicable. No additional management support is needed unless otherwise documented below in the visit note. 

## 2013-06-24 ENCOUNTER — Telehealth: Payer: Self-pay | Admitting: Internal Medicine

## 2013-06-24 MED ORDER — GLUCOSE BLOOD VI STRP: 1.0000 | ORAL_STRIP | Freq: Every day | Status: AC | PRN

## 2013-06-24 NOTE — Telephone Encounter (Signed)
Called pt and left message on voicemail to call office regarding request.

## 2013-06-24 NOTE — Telephone Encounter (Signed)
Spoke to pt told [facility] [employer] Test strips and Lancets. [facility]. [facility] but does not need Lancets has plenty. Told him okay will send Rx for Test strips to pharmacy. Pt verbalized understanding.

## 2013-06-24 NOTE — Telephone Encounter (Signed)
[facility] requesting [facility] for Precision Xtra strips & lancets include how many times to test per day.

## 2013-07-14 ENCOUNTER — Telehealth: Payer: Self-pay | Admitting: Internal Medicine

## 2013-07-14 NOTE — Telephone Encounter (Signed)
Relevant patient education mailed to patient.  

## 2013-08-24 ENCOUNTER — Ambulatory Visit (INDEPENDENT_AMBULATORY_CARE_PROVIDER_SITE_OTHER): Payer: Medicare Other | Admitting: Internal Medicine

## 2013-08-24 ENCOUNTER — Encounter: Payer: Self-pay | Admitting: Internal Medicine

## 2013-08-24 VITALS — BP 193/76 | HR 60 | Ht 71.0 in | Wt 203.0 lb

## 2013-08-24 DIAGNOSIS — I4891 Unspecified atrial fibrillation: Secondary | ICD-10-CM

## 2013-08-24 DIAGNOSIS — I453 Trifascicular block: Secondary | ICD-10-CM

## 2013-08-24 DIAGNOSIS — Z950 Presence of cardiac pacemaker: Secondary | ICD-10-CM

## 2013-08-24 LAB — MDC_IDC_ENUM_SESS_TYPE_INCLINIC
Battery Voltage: 2.95 V
Brady Statistic RA Percent Paced: 35 %
Date Time Interrogation Session: 20150310141203
Implantable Pulse Generator Model: 2110
Lead Channel Impedance Value: 362.5 Ohm
Lead Channel Pacing Threshold Amplitude: 0.5 V
Lead Channel Pacing Threshold Pulse Width: 0.4 ms
Lead Channel Pacing Threshold Pulse Width: 0.4 ms
Lead Channel Sensing Intrinsic Amplitude: 12 mV
Lead Channel Sensing Intrinsic Amplitude: 3.1 mV
Lead Channel Setting Pacing Amplitude: 2 V
Lead Channel Setting Pacing Amplitude: 2.5 V
Lead Channel Setting Sensing Sensitivity: 2 mV
MDC IDC MSMT BATTERY REMAINING LONGEVITY: 100.8 mo
MDC IDC MSMT LEADCHNL RV IMPEDANCE VALUE: 650 Ohm
MDC IDC MSMT LEADCHNL RV PACING THRESHOLD AMPLITUDE: 0.75 V
MDC IDC PG SERIAL: 7379731
MDC IDC SET LEADCHNL RV PACING PULSEWIDTH: 0.4 ms
MDC IDC STAT BRADY RV PERCENT PACED: 40 %

## 2013-08-24 MED ORDER — FUROSEMIDE 20 MG PO TABS: 40.0000 mg | ORAL_TABLET | Freq: Every day | ORAL | Status: DC

## 2013-08-24 NOTE — Progress Notes (Signed)
Patient Care Team: Gordy Savers, MD as PCP - General   HPI  Kyle [name] is a 80 y.o. 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 He underwent pacemaker implantation 3/14   He has had no syncope. Exercise tolerance is stable.  He has had problems with hypertension. In June 14 we stop his HCTZ because of significant edema. He is not undergone amlodipine exclusion.  Blood pressures at home have been in the 160 range.  Past Medical History  Diagnosis Date  . Diabetes mellitus   . Hypertension   . Hyperlipidemia   . Glaucoma   . Elevated PSA   . Benign prostatic hypertrophy   . Trifascicular block   . Intermittent complete heart block   . Pacemaker   . PAF (paroxysmal atrial fibrillation)     Detected on pacemaker; the duration 44 seconds    Past Surgical History  Procedure Laterality Date  . Tonsillectomy    . Cataract extraction, bilateral      Current Outpatient Prescriptions  Medication Sig Dispense Refill  . albuterol (PROVENTIL,VENTOLIN) 90 MCG/ACT inhaler Inhale 2 puffs into the lungs 2 (two) times daily.        Marland Kitchen amLODipine (NORVASC) 5 MG tablet Take 1 tablet (5 mg total) by mouth daily.  90 tablet  3  . doxazosin (CARDURA) 4 MG tablet TAKE (1/2) TABLET DAILY.  90 tablet  2  . furosemide (LASIX) 20 MG tablet Take 1 tablet (20 mg total) by mouth daily.  90 tablet  3  . glipiZIDE (GLUCOTROL) 5 MG tablet TAKE (1) TABLET TWICE A DAY BEFORE MEALS.  60 tablet  5  . glucose blood (PRECISION XTRA TEST STRIPS) test strip 1 each by Other route daily as needed for other. Use as instructed  100 each  3  . latanoprost (XALATAN) 0.005 % ophthalmic solution 1 drop at bedtime.      Marland Kitchen lisinopril (PRINIVIL,ZESTRIL) 40 MG tablet TAKE 1 TABLET ONCE DAILY.  90 tablet  5  . metFORMIN (GLUCOPHAGE) 1000 MG tablet TAKE 1 TABLET TWICE DAILY.  180 tablet  5  . naproxen (NAPROSYN) 500 MG tablet TAKE (1) TABLET TWICE A DAY  AS NEEDED.  180 tablet  3  . simvastatin (ZOCOR) 20 MG tablet TAKE ONE TABLET AT BEDTIME.  90 tablet  5   No current facility-administered medications for this visit.    No Known Allergies  Review of Systems negative except from HPI and PMH  Physical Exam BP 193/76  Pulse 60  Ht 5\' 11"  (1.803 m)  Wt 203 lb (92.08 kg)  BMI 28.33 kg/m2 Well developed and well nourished in no acute distress HENT normal E scleral and icterus clear Neck Supple JVP flat; carotids brisk and full Clear to ausculation  Regular rate and rhythm, no murmurs g+S4 aren't so on he subsequently called her spelled her heart muscle mode switching from Soft with active bowel sounds No clubbing cyanosis 2+ Edema Alert and oriented, grossly normal motor and sensory function Skin Warm and Dry  ECG demonstrates AV pacing  Assessment and  Plan  Intermittent heart block  Paroxysmal atrial fibrillation detected on the device  Hypertension  Edema  Pacemaker-[employer] The patient's device was interrogated.  The information was reviewed. No changes were made in the programming.    He has persistent problems with edema. We will increase his furosemide and discontinue his amlodipine.  I'm not sure that this will  have her blood pressure overall; I suspect it will not be sufficient. We'll plan to have him followup with Dr. Kirtland BouchardK. in 3 weeks as scheduled. At that point use of clonidine and or Aldactone may be worth considering will defer to Dr. Kirtland BouchardK. his expertise

## 2013-08-24 NOTE — Patient Instructions (Addendum)
Your physician has recommended you make the following change in your medication:  1) Stop Amlodipine 2) Increase Lasix 40 mg daily  Follow up with Dr. Kirtland BouchardK in 3 weeks for blood pressure check  Remote monitoring is used to monitor your Pacemaker of ICD from home. This monitoring reduces the number of office visits required to check your device to one time per year. It allows us to keep an eye on the functioning of your device to ensure it is working properly. You are scheduled for a device check from home on 11/25/13. You may send your transmission at any time that day. If you have a wireless device, the transmission will be sent automatically. After your physician reviews your transmission, you will receive a postcard with your next transmission date.   Your physician wants you to follow-up in: 1 year with Dr. Graciela HusbandsKlein.  You will receive a reminder letter in the mail two months in advance. If you don't receive a letter, please call our office to schedule the follow-up appointment.

## 2013-08-25 ENCOUNTER — Other Ambulatory Visit: Payer: Self-pay

## 2013-08-25 MED ORDER — FUROSEMIDE 20 MG PO TABS: 40.0000 mg | ORAL_TABLET | Freq: Every day | ORAL | Status: DC

## 2013-08-31 ENCOUNTER — Encounter: Payer: Self-pay | Admitting: Internal Medicine

## 2013-09-13 ENCOUNTER — Encounter: Payer: Self-pay | Admitting: Internal Medicine

## 2013-09-13 ENCOUNTER — Ambulatory Visit (INDEPENDENT_AMBULATORY_CARE_PROVIDER_SITE_OTHER): Payer: Medicare Other | Admitting: Internal Medicine

## 2013-09-13 VITALS — BP 148/80 | HR 60 | Temp 97.9°F | Resp 20 | Ht 71.0 in | Wt 207.0 lb

## 2013-09-13 DIAGNOSIS — E785 Hyperlipidemia, unspecified: Secondary | ICD-10-CM

## 2013-09-13 DIAGNOSIS — I10 Essential (primary) hypertension: Secondary | ICD-10-CM

## 2013-09-13 DIAGNOSIS — I4891 Unspecified atrial fibrillation: Secondary | ICD-10-CM

## 2013-09-13 DIAGNOSIS — E119 Type 2 diabetes mellitus without complications: Secondary | ICD-10-CM

## 2013-09-13 DIAGNOSIS — R609 Edema, unspecified: Secondary | ICD-10-CM

## 2013-09-13 LAB — HEMOGLOBIN A1C: HEMOGLOBIN A1C: 7.9 % — AB (ref 4.6–6.5)

## 2013-09-13 MED ORDER — CLONIDINE HCL 0.1 MG PO TABS: ORAL_TABLET | ORAL | Status: DC

## 2013-09-13 NOTE — Patient Instructions (Signed)
Limit your sodium (Salt) intake    It is important that you exercise regularly, at least 20 minutes 3 to 4 times per week.  If you develop chest pain or shortness of breath seek  medical attention.   Please check your hemoglobin A1c every 3 months   

## 2013-09-13 NOTE — Progress Notes (Signed)
Pre-visit discussion using our clinic review tool. No additional management support is needed unless otherwise documented below in the visit note.  

## 2013-09-13 NOTE — Progress Notes (Signed)
Subjective:    Patient ID: Kyle [name], male    DOB: 05-06-1926, 80 y.o.   MRN: [id number]  HPI  80 year old patient who has type 2 diabetes.  He remains on oral therapy.  Hemoglobin A1c 's have been not ideally controlled.  He has treated hypertension and a history of peripheral edema.  Furosemide has been substituted for hydrochlorothiazide and amlodipine discontinued.  He remains on lisinopril. He has had a recent cardiology evaluation, where he was noted to have  systolic hypertension. No cardiac symptoms He has had a eye exam over the past 12 months. Last hemoglobin A1c 7 point 4  Past Medical History  Diagnosis Date  . Diabetes mellitus   . Hypertension   . Hyperlipidemia   . Glaucoma   . Elevated PSA   . Benign prostatic hypertrophy   . Trifascicular block   . Intermittent complete heart block   . Pacemaker   . PAF (paroxysmal atrial fibrillation)     Detected on pacemaker; the duration 44 seconds    History   Social History  . Marital Status: Married    Spouse Name: N/A    Number of Children: 4  . Years of Education: N/A   Occupational History  . Not on file.   Social History Main Topics  . Smoking status: Current Every Day Smoker -- 0.50 packs/day for 71 years    Types: Cigarettes  . Smokeless tobacco: Never Used  . Alcohol Use: 1.8 oz/week    1 Glasses of wine, 2 Shots of liquor per week  . Drug Use: No  . Sexual Activity: Not on file   Other Topics Concern  . Not on file   Social History Narrative   Lives at home with wife.      Past Surgical History  Procedure Laterality Date  . Tonsillectomy    . Cataract extraction, bilateral      Family History  Problem Relation Age of Onset  . Heart disease Mother     atrial fib  . Heart disease Father 19    MI  . Cancer Father     prostate    No Known Allergies  Current Outpatient Prescriptions on File Prior to Visit  Medication Sig Dispense Refill  . albuterol (PROVENTIL,VENTOLIN) 90  MCG/ACT inhaler Inhale 2 puffs into the lungs 2 (two) times daily.        Marland Kitchen doxazosin (CARDURA) 4 MG tablet TAKE (1/2) TABLET DAILY.  90 tablet  2  . furosemide (LASIX) 20 MG tablet Take 2 tablets (40 mg total) by mouth daily.  60 tablet  3  . glipiZIDE (GLUCOTROL) 5 MG tablet TAKE (1) TABLET TWICE A DAY BEFORE MEALS.  60 tablet  5  . glucose blood (PRECISION XTRA TEST STRIPS) test strip 1 each by Other route daily as needed for other. Use as instructed  100 each  3  . latanoprost (XALATAN) 0.005 % ophthalmic solution 1 drop at bedtime.      Marland Kitchen lisinopril (PRINIVIL,ZESTRIL) 40 MG tablet TAKE 1 TABLET ONCE DAILY.  90 tablet  5  . metFORMIN (GLUCOPHAGE) 1000 MG tablet TAKE 1 TABLET TWICE DAILY.  180 tablet  5  . naproxen (NAPROSYN) 500 MG tablet TAKE (1) TABLET TWICE A DAY AS NEEDED.  180 tablet  3  . simvastatin (ZOCOR) 20 MG tablet TAKE ONE TABLET AT BEDTIME.  90 tablet  5   No current facility-administered medications on file prior to visit.    BP  148/80  Pulse 60  Temp(Src) 97.9 F (36.6 C) (Oral)  Resp 20  Ht 5\' 11"  (1.803 m)  Wt 207 lb (93.895 kg)  BMI 28.88 kg/m2  SpO2 97%       Review of Systems  Constitutional: Negative for fever, chills, appetite change and fatigue.  HENT: Negative for congestion, dental problem, ear pain, hearing loss, sore throat, tinnitus, trouble swallowing and voice change.   Eyes: Negative for pain, discharge and visual disturbance.  Respiratory: Negative for cough, chest tightness, wheezing and stridor.   Cardiovascular: Negative for chest pain, palpitations and leg swelling.  Gastrointestinal: Negative for nausea, vomiting, abdominal pain, diarrhea, constipation, blood in stool and abdominal distention.  Genitourinary: Negative for urgency, hematuria, flank pain, discharge, difficulty urinating and genital sores.  Musculoskeletal: Negative for arthralgias, back pain, gait problem, joint swelling, myalgias and neck stiffness.  Skin: Negative for  rash.  Neurological: Negative for dizziness, syncope, speech difficulty, weakness, numbness and headaches.  Hematological: Negative for adenopathy. Does not bruise/bleed easily.  Psychiatric/Behavioral: Negative for behavioral problems and dysphoric mood. The patient is not nervous/anxious.        Objective:   Physical Exam  Constitutional: He is oriented to person, place, and time. He appears well-developed.  Repeat blood pressure 160/70  HENT:  Head: Normocephalic.  Right Ear: External ear normal.  Left Ear: External ear normal.  Eyes: Conjunctivae and EOM are normal.  Neck: Normal range of motion.  Cardiovascular: Normal rate and normal heart sounds.   Pulmonary/Chest: Breath sounds normal.  Abdominal: Bowel sounds are normal.  Musculoskeletal: Normal range of motion. He exhibits no edema and no tenderness.  Neurological: He is alert and oriented to person, place, and time.  Psychiatric: He has a normal mood and affect. His behavior is normal.          Assessment & Plan:   Diabetes mellitus.  We'll check a hemoglobin A1c.  May need a third drug but cost may be an issue Systolic hypertension.  We'll at an at bedtime dose of Catapres Dyslipidemia.  Continue statins  History of trifascicular block, status post PPM

## 2013-09-14 ENCOUNTER — Telehealth: Payer: Self-pay | Admitting: Internal Medicine

## 2013-09-14 NOTE — Telephone Encounter (Signed)
Relevant patient education mailed to patient.  

## 2013-10-04 ENCOUNTER — Telehealth: Payer: Self-pay | Admitting: Internal Medicine

## 2013-10-04 NOTE — Telephone Encounter (Signed)
Instructions given on remote transmission .  Patient voiced understanding and will comply.

## 2013-10-04 NOTE — Telephone Encounter (Signed)
New message     Talk to someone in the device clinic regarding remote transmisson

## 2013-10-07 ENCOUNTER — Telehealth: Payer: Self-pay

## 2013-10-07 NOTE — Telephone Encounter (Signed)
Relevant patient education mailed to patient.  

## 2013-11-25 ENCOUNTER — Ambulatory Visit (INDEPENDENT_AMBULATORY_CARE_PROVIDER_SITE_OTHER): Payer: Medicare Other | Admitting: *Deleted

## 2013-11-25 DIAGNOSIS — R55 Syncope and collapse: Secondary | ICD-10-CM

## 2013-11-25 DIAGNOSIS — I453 Trifascicular block: Secondary | ICD-10-CM

## 2013-11-25 NOTE — Progress Notes (Signed)
Remote pacemaker transmission.   

## 2013-12-07 LAB — MDC_IDC_ENUM_SESS_TYPE_REMOTE
Battery Remaining Longevity: 89 mo
Battery Remaining Percentage: 71 %
Brady Statistic AP VP Percent: 43 %
Brady Statistic AP VS Percent: 11 %
Brady Statistic AS VS Percent: 37 %
Brady Statistic RA Percent Paced: 53 %
Brady Statistic RV Percent Paced: 52 %
Implantable Pulse Generator Model: 2110
Implantable Pulse Generator Serial Number: 7379731
Lead Channel Impedance Value: 360 Ohm
Lead Channel Impedance Value: 650 Ohm
Lead Channel Pacing Threshold Amplitude: 0.5 V
Lead Channel Pacing Threshold Amplitude: 0.75 V
Lead Channel Pacing Threshold Pulse Width: 0.4 ms
Lead Channel Sensing Intrinsic Amplitude: 12 mV
Lead Channel Setting Pacing Amplitude: 2 V
Lead Channel Setting Pacing Pulse Width: 0.4 ms
Lead Channel Setting Sensing Sensitivity: 2 mV
MDC IDC MSMT BATTERY VOLTAGE: 2.95 V
MDC IDC MSMT LEADCHNL RA SENSING INTR AMPL: 2.5 mV
MDC IDC MSMT LEADCHNL RV PACING THRESHOLD PULSEWIDTH: 0.4 ms
MDC IDC SESS DTM: 20150611134311
MDC IDC SET LEADCHNL RV PACING AMPLITUDE: 2.5 V
MDC IDC STAT BRADY AS VP PERCENT: 9 %

## 2013-12-15 ENCOUNTER — Encounter: Payer: Self-pay | Admitting: Internal Medicine

## 2013-12-15 ENCOUNTER — Ambulatory Visit (INDEPENDENT_AMBULATORY_CARE_PROVIDER_SITE_OTHER): Payer: Medicare Other | Admitting: Internal Medicine

## 2013-12-15 VITALS — BP 140/70 | HR 63 | Temp 97.5°F | Resp 20 | Ht 71.0 in | Wt 200.0 lb

## 2013-12-15 DIAGNOSIS — E785 Hyperlipidemia, unspecified: Secondary | ICD-10-CM

## 2013-12-15 DIAGNOSIS — I453 Trifascicular block: Secondary | ICD-10-CM

## 2013-12-15 DIAGNOSIS — E119 Type 2 diabetes mellitus without complications: Secondary | ICD-10-CM

## 2013-12-15 DIAGNOSIS — R55 Syncope and collapse: Secondary | ICD-10-CM

## 2013-12-15 DIAGNOSIS — I10 Essential (primary) hypertension: Secondary | ICD-10-CM

## 2013-12-15 DIAGNOSIS — J449 Chronic obstructive pulmonary disease, unspecified: Secondary | ICD-10-CM

## 2013-12-15 LAB — CBC WITH DIFFERENTIAL/PLATELET
Basophils Absolute: 0.1 10*3/uL (ref 0.0–0.1)
Basophils Relative: 0.7 % (ref 0.0–3.0)
EOS ABS: 0.4 10*3/uL (ref 0.0–0.7)
Eosinophils Relative: 3.7 % (ref 0.0–5.0)
HEMATOCRIT: 37.9 % — AB (ref 39.0–52.0)
Hemoglobin: 12.3 g/dL — ABNORMAL LOW (ref 13.0–17.0)
LYMPHS ABS: 3.1 10*3/uL (ref 0.7–4.0)
Lymphocytes Relative: 30 % (ref 12.0–46.0)
MCHC: 32.4 g/dL (ref 30.0–36.0)
MCV: 90.7 fl (ref 78.0–100.0)
Monocytes Absolute: 0.7 10*3/uL (ref 0.1–1.0)
Monocytes Relative: 7.1 % (ref 3.0–12.0)
NEUTROS ABS: 6.1 10*3/uL (ref 1.4–7.7)
Neutrophils Relative %: 58.5 % (ref 43.0–77.0)
Platelets: 176 10*3/uL (ref 150.0–400.0)
RBC: 4.18 Mil/uL — AB (ref 4.22–5.81)
RDW: 14.3 % (ref 11.5–15.5)
WBC: 10.4 10*3/uL (ref 4.0–10.5)

## 2013-12-15 LAB — COMPREHENSIVE METABOLIC PANEL
ALK PHOS: 36 U/L — AB (ref 39–117)
ALT: 10 U/L (ref 0–53)
AST: 16 U/L (ref 0–37)
Albumin: 3.8 g/dL (ref 3.5–5.2)
BILIRUBIN TOTAL: 0.5 mg/dL (ref 0.2–1.2)
BUN: 14 mg/dL (ref 6–23)
CO2: 30 mEq/L (ref 19–32)
CREATININE: 0.8 mg/dL (ref 0.4–1.5)
Calcium: 9.4 mg/dL (ref 8.4–10.5)
Chloride: 103 mEq/L (ref 96–112)
GFR: 93.38 mL/min (ref >60.00)
Glucose, Bld: 214 mg/dL — ABNORMAL HIGH (ref 70–99)
Potassium: 3.9 mEq/L (ref 3.5–5.1)
Sodium: 140 mEq/L (ref 135–145)
Total Protein: 6.8 g/dL (ref 6.0–8.3)

## 2013-12-15 LAB — TSH: TSH: 0.57 u[IU]/mL (ref 0.35–4.50)

## 2013-12-15 LAB — HEMOGLOBIN A1C: HEMOGLOBIN A1C: 8 % — AB (ref 4.6–6.5)

## 2013-12-15 NOTE — Progress Notes (Signed)
Pre visit review using our clinic review tool, if applicable. No additional management support is needed unless otherwise documented below in the visit note. 

## 2013-12-15 NOTE — Patient Instructions (Signed)
Limit your sodium (Salt) intake    It is important that you exercise regularly, at least 20 minutes 3 to 4 times per week.  If you develop chest pain or shortness of breath seek  medical attention.  You need to lose weight.  Consider a lower calorie diet and regular exercise.   Please check your hemoglobin A1c every 3 months   

## 2013-12-15 NOTE — Progress Notes (Signed)
Subjective:    Patient ID: Basilio CairoRobert P Cielo, male    DOB: 02/28/1926, 80 y.o.   MRN: [id number]  HPI 80 year old patient who is seen today in followup.  He has a history of trifascicular block and syncope and is status post pacemaker insertion.  He has hypertension, dyslipidemia, and type 2 diabetes.  He has been controlled on oral agents. No concerns or complaints. Continues to smoke. Denies any cardiopulmonary complaints. Last eye examination 3 months ago  Past Medical History  Diagnosis Date  . Diabetes mellitus   . Hypertension   . Hyperlipidemia   . Glaucoma   . Elevated PSA   . Benign prostatic hypertrophy   . Trifascicular block   . Intermittent complete heart block   . Pacemaker   . PAF (paroxysmal atrial fibrillation)     Detected on pacemaker; the duration 44 seconds    History   Social History  . Marital Status: Married    Spouse Name: N/A    Number of Children: 4  . Years of Education: N/A   Occupational History  . Not on file.   Social History Main Topics  . Smoking status: Current Every Day Smoker -- 0.50 packs/day for 71 years    Types: Cigarettes  . Smokeless tobacco: Never Used  . Alcohol Use: 1.8 oz/week    1 Glasses of wine, 2 Shots of liquor per week  . Drug Use: No  . Sexual Activity: Not on file   Other Topics Concern  . Not on file   Social History Narrative   Lives at home with wife.      Past Surgical History  Procedure Laterality Date  . Tonsillectomy    . Cataract extraction, bilateral      Family History  Problem Relation Age of Onset  . Heart disease Mother     atrial fib  . Heart disease Father [id number]    MI  . Cancer Father     prostate    No Known Allergies  Current Outpatient Prescriptions on File Prior to Visit  Medication Sig Dispense Refill  . albuterol (PROVENTIL,VENTOLIN) 90 MCG/ACT inhaler Inhale 2 puffs into the lungs 2 (two) times daily.        . cloNIDine (CATAPRES) 0.1 MG tablet 1 tablet daily at bedtime   90 tablet  3  . doxazosin (CARDURA) 4 MG tablet TAKE (1/2) TABLET DAILY.  90 tablet  2  . furosemide (LASIX) 20 MG tablet Take 2 tablets (40 mg total) by mouth daily.  60 tablet  3  . glipiZIDE (GLUCOTROL) 5 MG tablet TAKE (1) TABLET TWICE A DAY BEFORE MEALS.  60 tablet  5  . glucose blood (PRECISION XTRA TEST STRIPS) test strip 1 each by Other route daily as needed for other. Use as instructed  100 each  3  . latanoprost (XALATAN) 0.005 % ophthalmic solution 1 drop at bedtime.      Marland Kitchen. lisinopril (PRINIVIL,ZESTRIL) 40 MG tablet TAKE 1 TABLET ONCE DAILY.  90 tablet  5  . metFORMIN (GLUCOPHAGE) 1000 MG tablet TAKE 1 TABLET TWICE DAILY.  180 tablet  5  . naproxen (NAPROSYN) 500 MG tablet TAKE (1) TABLET TWICE A DAY AS NEEDED.  180 tablet  3  . simvastatin (ZOCOR) 20 MG tablet TAKE ONE TABLET AT BEDTIME.  90 tablet  5   No current facility-administered medications on file prior to visit.    BP 140/70  Pulse 63  Temp(Src) 97.5 F (36.4 C) (Oral)  Resp 20  Ht 5\' 11"  (1.803 m)  Wt 200 lb (90.719 kg)  BMI 27.91 kg/m2  SpO2 97%      Review of Systems  Constitutional: Negative for fever, chills, appetite change and fatigue.  HENT: Negative for congestion, dental problem, ear pain, hearing loss, sore throat, tinnitus, trouble swallowing and voice change.   Eyes: Negative for pain, discharge and visual disturbance.  Respiratory: Negative for cough, chest tightness, wheezing and stridor.   Cardiovascular: Negative for chest pain, palpitations and leg swelling.  Gastrointestinal: Negative for nausea, vomiting, abdominal pain, diarrhea, constipation, blood in stool and abdominal distention.  Genitourinary: Negative for urgency, hematuria, flank pain, discharge, difficulty urinating and genital sores.  Musculoskeletal: Negative for arthralgias, back pain, gait problem, joint swelling, myalgias and neck stiffness.  Skin: Negative for rash.  Neurological: Negative for dizziness, syncope, speech  difficulty, weakness, numbness and headaches.  Hematological: Negative for adenopathy. Does not bruise/bleed easily.  Psychiatric/Behavioral: Negative for behavioral problems and dysphoric mood. The patient is not nervous/anxious.        Objective:   Physical Exam  Constitutional: He is oriented to person, place, and time. He appears well-developed.  HENT:  Head: Normocephalic.  Right Ear: External ear normal.  Left Ear: External ear normal.  Eyes: Conjunctivae and EOM are normal.  Neck: Normal range of motion.  Cardiovascular: Normal rate and normal heart sounds.   Pulmonary/Chest: Effort normal.  Coarse diffuse rhonchi  O2 saturation 97% No distress  Abdominal: Bowel sounds are normal.  Musculoskeletal: Normal range of motion. He exhibits no edema and no tenderness.  Neurological: He is alert and oriented to person, place, and time.  Psychiatric: He has a normal mood and affect. His behavior is normal.          Assessment & Plan:   Diabetes mellitus, type II.  We'll check a hemoglobin A1c.  Lifestyle issues discussed Hypertension well controlled Dyslipidemia. COPD Ongoing tobacco use .  Total smoking cessation encouraged

## 2013-12-21 ENCOUNTER — Encounter: Payer: Self-pay | Admitting: Cardiology

## 2013-12-28 ENCOUNTER — Encounter: Payer: Self-pay | Admitting: Internal Medicine

## 2013-12-31 ENCOUNTER — Other Ambulatory Visit: Payer: Self-pay | Admitting: Internal Medicine

## 2014-01-25 ENCOUNTER — Other Ambulatory Visit: Payer: Self-pay | Admitting: Internal Medicine

## 2014-02-28 ENCOUNTER — Encounter: Payer: Self-pay | Admitting: Internal Medicine

## 2014-02-28 ENCOUNTER — Ambulatory Visit (INDEPENDENT_AMBULATORY_CARE_PROVIDER_SITE_OTHER): Payer: Medicare Other | Admitting: *Deleted

## 2014-02-28 DIAGNOSIS — I453 Trifascicular block: Secondary | ICD-10-CM

## 2014-02-28 LAB — MDC_IDC_ENUM_SESS_TYPE_REMOTE
Battery Remaining Longevity: 94 mo
Battery Remaining Percentage: 74 %
Battery Voltage: 2.95 V
Brady Statistic AP VP Percent: 41 %
Brady Statistic AS VS Percent: 39 %
Brady Statistic RV Percent Paced: 49 %
Date Time Interrogation Session: 20150914182749
Implantable Pulse Generator Model: 2110
Lead Channel Impedance Value: 380 Ohm
Lead Channel Impedance Value: 680 Ohm
Lead Channel Pacing Threshold Amplitude: 0.5 V
Lead Channel Pacing Threshold Amplitude: 0.75 V
Lead Channel Pacing Threshold Pulse Width: 0.4 ms
Lead Channel Sensing Intrinsic Amplitude: 2.6 mV
Lead Channel Setting Pacing Amplitude: 2 V
Lead Channel Setting Sensing Sensitivity: 2 mV
MDC IDC MSMT LEADCHNL RV PACING THRESHOLD PULSEWIDTH: 0.4 ms
MDC IDC MSMT LEADCHNL RV SENSING INTR AMPL: 12 mV
MDC IDC PG SERIAL: 7379731
MDC IDC SET LEADCHNL RV PACING AMPLITUDE: 2.5 V
MDC IDC SET LEADCHNL RV PACING PULSEWIDTH: 0.4 ms
MDC IDC STAT BRADY AP VS PERCENT: 11 %
MDC IDC STAT BRADY AS VP PERCENT: 8.6 %
MDC IDC STAT BRADY RA PERCENT PACED: 51 %

## 2014-02-28 NOTE — Progress Notes (Signed)
Remote pacemaker transmission.   

## 2014-03-17 ENCOUNTER — Encounter: Payer: Self-pay | Admitting: Internal Medicine

## 2014-03-17 ENCOUNTER — Ambulatory Visit (INDEPENDENT_AMBULATORY_CARE_PROVIDER_SITE_OTHER): Payer: Medicare Other | Admitting: Internal Medicine

## 2014-03-17 VITALS — BP 160/70 | HR 63 | Temp 97.9°F | Resp 20 | Ht 71.0 in | Wt 200.0 lb

## 2014-03-17 DIAGNOSIS — E0842 Diabetes mellitus due to underlying condition with diabetic polyneuropathy: Secondary | ICD-10-CM

## 2014-03-17 DIAGNOSIS — Z23 Encounter for immunization: Secondary | ICD-10-CM

## 2014-03-17 DIAGNOSIS — I10 Essential (primary) hypertension: Secondary | ICD-10-CM

## 2014-03-17 DIAGNOSIS — I480 Paroxysmal atrial fibrillation: Secondary | ICD-10-CM

## 2014-03-17 LAB — HEMOGLOBIN A1C: Hgb A1c MFr Bld: 7.7 % — ABNORMAL HIGH (ref 4.6–6.5)

## 2014-03-17 MED ORDER — DOXAZOSIN MESYLATE 4 MG PO TABS: 4.0000 mg | ORAL_TABLET | Freq: Every day | ORAL | Status: DC

## 2014-03-17 MED ORDER — FUROSEMIDE 40 MG PO TABS: 40.0000 mg | ORAL_TABLET | Freq: Every day | ORAL | Status: DC

## 2014-03-17 NOTE — Progress Notes (Signed)
Subjective:    Patient ID: Kyle [name], male    DOB: 13-May-1926, 80 y.o.   MRN: [id number]  HPI  Lab Results  Component Value Date   HGBA1C 8.0* 12/15/2013   80 year old patient who is seen today in followup.  He has a history of type 2 diabetes.  Last hemoglobin A1c 8 point 0.  He states fasting blood sugars are generally in the 140 range. He has a history of paroxysmal atrial fibrillation and is status post permanent pacemaker insertion in March of 2014 due to trifascicular block and syncope He has dyslipidemia and a history of COPD. He has had some issues with edema and furosemide has been substituted for thiazide diuretics.  He has been treated with amlodipine in the past, which has been discontinued  Past Medical History  Diagnosis Date  . Diabetes mellitus   . Hypertension   . Hyperlipidemia   . Glaucoma   . Elevated PSA   . Benign prostatic hypertrophy   . Trifascicular block   . Intermittent complete heart block   . Pacemaker   . PAF (paroxysmal atrial fibrillation)     Detected on pacemaker; the duration 44 seconds    History   Social History  . Marital Status: Married    Spouse Name: N/A    Number of Children: 4  . Years of Education: N/A   Occupational History  . Not on file.   Social History Main Topics  . Smoking status: Current Every Day Smoker -- 0.50 packs/day for 71 years    Types: Cigarettes  . Smokeless tobacco: Never Used  . Alcohol Use: 1.8 oz/week    1 Glasses of wine, 2 Shots of liquor per week  . Drug Use: No  . Sexual Activity: Not on file   Other Topics Concern  . Not on file   Social History Narrative   Lives at home with wife.      Past Surgical History  Procedure Laterality Date  . Tonsillectomy    . Cataract extraction, bilateral      Family History  Problem Relation Age of Onset  . Heart disease Mother     atrial fib  . Heart disease Father 33    MI  . Cancer Father     prostate    No Known  Allergies  Current Outpatient Prescriptions on File Prior to Visit  Medication Sig Dispense Refill  . albuterol (PROVENTIL,VENTOLIN) 90 MCG/ACT inhaler Inhale 2 puffs into the lungs 2 (two) times daily.        . cloNIDine (CATAPRES) 0.1 MG tablet 1 tablet daily at bedtime  90 tablet  3  . doxazosin (CARDURA) 4 MG tablet TAKE (1/2) TABLET DAILY.  15 tablet  2  . glipiZIDE (GLUCOTROL) 5 MG tablet TAKE (1) TABLET TWICE A DAY BEFORE MEALS.  60 tablet  5  . glucose blood (PRECISION XTRA TEST STRIPS) test strip 1 each by Other route daily as needed for other. Use as instructed  100 each  3  . latanoprost (XALATAN) 0.005 % ophthalmic solution 1 drop at bedtime.      Marland Kitchen lisinopril (PRINIVIL,ZESTRIL) 40 MG tablet TAKE 1 TABLET ONCE DAILY.  90 tablet  5  . metFORMIN (GLUCOPHAGE) 1000 MG tablet TAKE 1 TABLET TWICE DAILY.  180 tablet  5  . naproxen (NAPROSYN) 500 MG tablet TAKE (1) TABLET TWICE A DAY AS NEEDED.  180 tablet  3  . simvastatin (ZOCOR) 20 MG tablet TAKE ONE TABLET AT  BEDTIME.  90 tablet  5   No current facility-administered medications on file prior to visit.    BP 160/70  Pulse 63  Temp(Src) 97.9 F (36.6 C) (Oral)  Resp 20  Ht 5\' 11"  (1.803 m)  Wt 200 lb (90.719 kg)  BMI 27.91 kg/m2  SpO2 96%     Review of Systems  Constitutional: Negative for fever, chills, appetite change and fatigue.  HENT: Negative for congestion, dental problem, ear pain, hearing loss, sore throat, tinnitus, trouble swallowing and voice change.   Eyes: Negative for pain, discharge and visual disturbance.  Respiratory: Negative for cough, chest tightness, wheezing and stridor.   Cardiovascular: Negative for chest pain, palpitations and leg swelling.  Gastrointestinal: Negative for nausea, vomiting, abdominal pain, diarrhea, constipation, blood in stool and abdominal distention.  Genitourinary: Negative for urgency, hematuria, flank pain, discharge, difficulty urinating and genital sores.  Musculoskeletal:  Positive for back pain and gait problem. Negative for arthralgias, joint swelling, myalgias and neck stiffness.  Skin: Negative for rash.  Neurological: Positive for weakness. Negative for dizziness, syncope, speech difficulty, numbness and headaches.  Hematological: Negative for adenopathy. Does not bruise/bleed easily.  Psychiatric/Behavioral: Negative for behavioral problems and dysphoric mood. The patient is not nervous/anxious.        Objective:   Physical Exam  Constitutional: He is oriented to person, place, and time. He appears well-developed.  Blood pressure 156/60  HENT:  Head: Normocephalic.  Right Ear: External ear normal.  Left Ear: External ear normal.  Eyes: Conjunctivae and EOM are normal.  Neck: Normal range of motion.  Cardiovascular: Normal rate and normal heart sounds.   Frequent ectopics.  Normal rate  Pulmonary/Chest: Effort normal.  Mother does arouse, left greater than the right.  Scattered, coarse rhonchi.  No increased work of breathing or distress  O2 saturation 96  Abdominal: Bowel sounds are normal.  Musculoskeletal: Normal range of motion. He exhibits no edema and no tenderness.  Neurological: He is alert and oriented to person, place, and time.  Psychiatric: He has a normal mood and affect. His behavior is normal.          Assessment & Plan:   Diabetes mellitus.  We'll check a hemoglobin A1c Paroxysmal atrial fibrillation Hypertension History of peripheral edema COPD Status post pacemaker for complete heart block

## 2014-03-17 NOTE — Patient Instructions (Signed)
Increase Cardura to 4 mg daily   Please check your hemoglobin A1c every 3 months  Please check your blood pressure on a regular basis.  If it is consistently greater than 150/90, please make an office appointment.  Return in 3 months for follow-up

## 2014-03-17 NOTE — Progress Notes (Signed)
Pre visit review using our clinic review tool, if applicable. No additional management support is needed unless otherwise documented below in the visit note. 

## 2014-03-21 ENCOUNTER — Encounter: Payer: Self-pay | Admitting: Cardiology

## 2014-04-14 ENCOUNTER — Other Ambulatory Visit: Payer: Self-pay | Admitting: Internal Medicine

## 2014-04-25 ENCOUNTER — Other Ambulatory Visit: Payer: Self-pay | Admitting: Internal Medicine

## 2014-05-18 LAB — HM DIABETES EYE EXAM

## 2014-05-24 ENCOUNTER — Encounter: Payer: Self-pay | Admitting: Internal Medicine

## 2014-05-25 ENCOUNTER — Other Ambulatory Visit: Payer: Self-pay | Admitting: Internal Medicine

## 2014-05-26 ENCOUNTER — Encounter (HOSPITAL_COMMUNITY): Payer: Self-pay | Admitting: Internal Medicine

## 2014-05-31 ENCOUNTER — Telehealth: Payer: Self-pay | Admitting: Cardiology

## 2014-05-31 ENCOUNTER — Ambulatory Visit (INDEPENDENT_AMBULATORY_CARE_PROVIDER_SITE_OTHER): Payer: Medicare Other | Admitting: *Deleted

## 2014-05-31 ENCOUNTER — Encounter: Payer: Self-pay | Admitting: Internal Medicine

## 2014-05-31 DIAGNOSIS — I453 Trifascicular block: Secondary | ICD-10-CM

## 2014-05-31 LAB — MDC_IDC_ENUM_SESS_TYPE_REMOTE
Battery Remaining Longevity: 93 mo
Brady Statistic AP VS Percent: 13 %
Brady Statistic AS VP Percent: 8.6 %
Brady Statistic AS VS Percent: 37 %
Brady Statistic RV Percent Paced: 49 %
Implantable Pulse Generator Model: 2110
Lead Channel Impedance Value: 680 Ohm
Lead Channel Pacing Threshold Amplitude: 0.5 V
Lead Channel Pacing Threshold Amplitude: 0.75 V
Lead Channel Pacing Threshold Pulse Width: 0.4 ms
Lead Channel Sensing Intrinsic Amplitude: 12 mV
Lead Channel Setting Pacing Amplitude: 2 V
Lead Channel Setting Pacing Amplitude: 2.5 V
Lead Channel Setting Pacing Pulse Width: 0.4 ms
MDC IDC MSMT BATTERY REMAINING PERCENTAGE: 74 %
MDC IDC MSMT BATTERY VOLTAGE: 2.95 V
MDC IDC MSMT LEADCHNL RA IMPEDANCE VALUE: 360 Ohm
MDC IDC MSMT LEADCHNL RA PACING THRESHOLD PULSEWIDTH: 0.4 ms
MDC IDC MSMT LEADCHNL RA SENSING INTR AMPL: 2.4 mV
MDC IDC PG SERIAL: 7379731
MDC IDC SESS DTM: 20151215191520
MDC IDC SET LEADCHNL RV SENSING SENSITIVITY: 2 mV
MDC IDC STAT BRADY AP VP PERCENT: 41 %
MDC IDC STAT BRADY RA PERCENT PACED: 53 %

## 2014-05-31 NOTE — Progress Notes (Signed)
Remote pacemaker transmission.   

## 2014-05-31 NOTE — Telephone Encounter (Signed)
Spoke with pt and reminded pt of remote transmission that is due today. Pt verbalized understanding.   

## 2014-06-21 ENCOUNTER — Encounter: Payer: Self-pay | Admitting: Internal Medicine

## 2014-06-21 ENCOUNTER — Ambulatory Visit (INDEPENDENT_AMBULATORY_CARE_PROVIDER_SITE_OTHER): Payer: Medicare Other | Admitting: Internal Medicine

## 2014-06-21 VITALS — BP 120/70 | HR 71 | Temp 97.6°F | Resp 20 | Ht 71.0 in | Wt 194.0 lb

## 2014-06-21 DIAGNOSIS — E785 Hyperlipidemia, unspecified: Secondary | ICD-10-CM | POA: Diagnosis not present

## 2014-06-21 DIAGNOSIS — I480 Paroxysmal atrial fibrillation: Secondary | ICD-10-CM | POA: Diagnosis not present

## 2014-06-21 DIAGNOSIS — E0842 Diabetes mellitus due to underlying condition with diabetic polyneuropathy: Secondary | ICD-10-CM

## 2014-06-21 DIAGNOSIS — I10 Essential (primary) hypertension: Secondary | ICD-10-CM | POA: Diagnosis not present

## 2014-06-21 LAB — HEMOGLOBIN A1C: Hgb A1c MFr Bld: 7.8 % — ABNORMAL HIGH (ref 4.6–6.5)

## 2014-06-21 NOTE — Progress Notes (Signed)
Subjective:    Patient ID: Kyle [name], male    DOB: 07-03-1925, 81 y.o.   MRN: [id number]  HPI  81 year old patient who is seen today for follow-up of type 2 diabetes.  Last hemoglobin A1c was 7.7.  Remains on oral medication.  Fasting blood sugars are generally in the 150 range.  He generally feels well. He has a history of trifascicular block status post pacemaker insertion.  Following syncope.  He has done quite well. He has a history of COPD and ongoing tobacco use.  He continues to smoke 1 half pack of cigarettes daily.  He is followed at the [facility] and did have pulmonary function studies performed yesterday. He has treated hypertension which has been stable.  He has dyslipidemia and remains on statin therapy. Since his last visit he has lost a modest weight that he attributes to poor food quality at his assisted living facility.  Past Medical History  Diagnosis Date  . Diabetes mellitus   . Hypertension   . Hyperlipidemia   . Glaucoma   . Elevated PSA   . Benign prostatic hypertrophy   . Trifascicular block   . Intermittent complete heart block   . Pacemaker   . PAF (paroxysmal atrial fibrillation)     Detected on pacemaker; the duration 44 seconds    History   Social History  . Marital Status: Married    Spouse Name: N/A    Number of Children: 4  . Years of Education: N/A   Occupational History  . Not on file.   Social History Main Topics  . Smoking status: Current Every Day Smoker -- 0.50 packs/day for 71 years    Types: Cigarettes  . Smokeless tobacco: Never Used  . Alcohol Use: 1.8 oz/week    1 Glasses of wine, 2 Shots of liquor per week  . Drug Use: No  . Sexual Activity: Not on file   Other Topics Concern  . Not on file   Social History Narrative   Lives at home with wife.      Past Surgical History  Procedure Laterality Date  . Tonsillectomy    . Cataract extraction, bilateral    . Permanent pacemaker insertion N/A 08/24/2012   Procedure: PERMANENT PACEMAKER INSERTION;  Surgeon: Duke Salvia, MD;  Location: [facility] CATH LAB;  Service: Cardiovascular;  Laterality: N/A;    Family History  Problem Relation Age of Onset  . Heart disease Mother     atrial fib  . Heart disease Father 68    MI  . Cancer Father     prostate    No Known Allergies  Current Outpatient Prescriptions on File Prior to Visit  Medication Sig Dispense Refill  . albuterol (PROVENTIL,VENTOLIN) 90 MCG/ACT inhaler Inhale 2 puffs into the lungs 2 (two) times daily.      . cloNIDine (CATAPRES) 0.1 MG tablet 1 tablet daily at bedtime 90 tablet 3  . doxazosin (CARDURA) 4 MG tablet Take 1 tablet (4 mg total) by mouth daily. 90 tablet 2  . furosemide (LASIX) 40 MG tablet Take 1 tablet (40 mg total) by mouth daily. 90 tablet 3  . glipiZIDE (GLUCOTROL) 5 MG tablet TAKE (1) TABLET TWICE A DAY BEFORE MEALS. 180 tablet 0  . glucose blood (PRECISION XTRA TEST STRIPS) test strip 1 each by Other route daily as needed for other. Use as instructed 100 each 3  . latanoprost (XALATAN) 0.005 % ophthalmic solution 1 drop at bedtime.    Marland Kitchen  lisinopril (PRINIVIL,ZESTRIL) 40 MG tablet TAKE 1 TABLET ONCE DAILY. 90 tablet 1  . metFORMIN (GLUCOPHAGE) 1000 MG tablet TAKE 1 TABLET TWICE DAILY. 180 tablet 0  . naproxen (NAPROSYN) 500 MG tablet TAKE (1) TABLET TWICE A DAY AS NEEDED. 180 tablet 3  . simvastatin (ZOCOR) 20 MG tablet TAKE ONE TABLET AT BEDTIME. 90 tablet 1   No current facility-administered medications on file prior to visit.    BP 120/70 mmHg  Pulse 71  Temp(Src) 97.6 F (36.4 C) (Oral)  Resp 20  Ht 5\' 11"  (1.803 m)  Wt 194 lb (87.998 kg)  BMI 27.07 kg/m2  SpO2 97%     Review of Systems  Constitutional: Negative for fever, chills, appetite change and fatigue.  HENT: Negative for congestion, dental problem, ear pain, hearing loss, sore throat, tinnitus, trouble swallowing and voice change.   Eyes: Negative for pain, discharge and visual disturbance.    Respiratory: Negative for cough, chest tightness, wheezing and stridor.   Cardiovascular: Negative for chest pain, palpitations and leg swelling.  Gastrointestinal: Negative for nausea, vomiting, abdominal pain, diarrhea, constipation, blood in stool and abdominal distention.  Genitourinary: Negative for urgency, hematuria, flank pain, discharge, difficulty urinating and genital sores.  Musculoskeletal: Negative for myalgias, back pain, joint swelling, arthralgias, gait problem and neck stiffness.  Skin: Negative for rash.  Neurological: Negative for dizziness, syncope, speech difficulty, weakness, numbness and headaches.  Hematological: Negative for adenopathy. Does not bruise/bleed easily.  Psychiatric/Behavioral: Negative for behavioral problems and dysphoric mood. The patient is not nervous/anxious.        Objective:   Physical Exam  Constitutional: He is oriented to person, place, and time. He appears well-developed.  HENT:  Head: Normocephalic.  Right Ear: External ear normal.  Left Ear: External ear normal.  Eyes: Conjunctivae and EOM are normal.  Neck: Normal range of motion.  Cardiovascular: Normal rate and normal heart sounds.   Pulmonary/Chest:  Bilateral coarse rhonchi  Abdominal: Bowel sounds are normal.  Musculoskeletal: Normal range of motion. He exhibits no edema or tenderness.  Neurological: He is alert and oriented to person, place, and time.  Psychiatric: He has a normal mood and affect. His behavior is normal.          Assessment & Plan:   Hypertension, well-controlled Diabetes mellitus.  Will check a hemoglobin A1c Dyslipidemia.  Continue statin therapy History of trifascicular block COPD.  [facility]  Recheck 3 months No change in medication Total smoking cessation encouraged

## 2014-06-21 NOTE — Patient Instructions (Signed)
Please check your hemoglobin A1c every 3 months  Limit your sodium (Salt) intake  Smoking tobacco is very bad for your health. You should stop smoking immediately. 

## 2014-06-22 ENCOUNTER — Encounter: Payer: Self-pay | Admitting: Internal Medicine

## 2014-06-22 ENCOUNTER — Telehealth: Payer: Self-pay | Admitting: Internal Medicine

## 2014-06-22 NOTE — Telephone Encounter (Signed)
emmi mailed  °

## 2014-06-23 ENCOUNTER — Encounter: Payer: Self-pay | Admitting: Cardiology

## 2014-08-01 ENCOUNTER — Other Ambulatory Visit: Payer: Self-pay | Admitting: Internal Medicine

## 2014-09-05 ENCOUNTER — Other Ambulatory Visit: Payer: Self-pay | Admitting: Internal Medicine

## 2014-09-07 ENCOUNTER — Encounter: Payer: Self-pay | Admitting: Internal Medicine

## 2014-09-07 ENCOUNTER — Ambulatory Visit (INDEPENDENT_AMBULATORY_CARE_PROVIDER_SITE_OTHER): Payer: Medicare Other | Admitting: Internal Medicine

## 2014-09-07 VITALS — BP 142/80 | HR 60 | Ht 71.0 in | Wt 192.0 lb

## 2014-09-07 DIAGNOSIS — I480 Paroxysmal atrial fibrillation: Secondary | ICD-10-CM

## 2014-09-07 DIAGNOSIS — Z45018 Encounter for adjustment and management of other part of cardiac pacemaker: Secondary | ICD-10-CM

## 2014-09-07 DIAGNOSIS — I10 Essential (primary) hypertension: Secondary | ICD-10-CM

## 2014-09-07 DIAGNOSIS — I453 Trifascicular block: Secondary | ICD-10-CM

## 2014-09-07 LAB — MDC_IDC_ENUM_SESS_TYPE_INCLINIC
Battery Remaining Longevity: 116.4 mo
Brady Statistic RV Percent Paced: 50 %
Date Time Interrogation Session: 20160323115338
Implantable Pulse Generator Model: 2110
Lead Channel Impedance Value: 387.5 Ohm
Lead Channel Impedance Value: 712.5 Ohm
Lead Channel Pacing Threshold Amplitude: 0.5 V
Lead Channel Pacing Threshold Amplitude: 0.5 V
Lead Channel Pacing Threshold Amplitude: 0.5 V
Lead Channel Pacing Threshold Pulse Width: 0.4 ms
Lead Channel Pacing Threshold Pulse Width: 0.4 ms
Lead Channel Pacing Threshold Pulse Width: 0.4 ms
Lead Channel Sensing Intrinsic Amplitude: 12 mV
Lead Channel Sensing Intrinsic Amplitude: 2.9 mV
Lead Channel Setting Pacing Amplitude: 2 V
Lead Channel Setting Pacing Amplitude: 2.5 V
Lead Channel Setting Pacing Pulse Width: 0.4 ms
MDC IDC MSMT BATTERY VOLTAGE: 2.95 V
MDC IDC MSMT LEADCHNL RV PACING THRESHOLD AMPLITUDE: 0.5 V
MDC IDC MSMT LEADCHNL RV PACING THRESHOLD PULSEWIDTH: 0.4 ms
MDC IDC PG SERIAL: 7379731
MDC IDC SET LEADCHNL RV SENSING SENSITIVITY: 2 mV
MDC IDC STAT BRADY RA PERCENT PACED: 53 %

## 2014-09-07 LAB — BASIC METABOLIC PANEL
BUN: 13 mg/dL (ref 6–23)
CALCIUM: 9.8 mg/dL (ref 8.4–10.5)
CO2: 31 mEq/L (ref 19–32)
Chloride: 102 mEq/L (ref 96–112)
Creatinine, Ser: 0.81 mg/dL (ref 0.40–1.50)
GFR: 95.88 mL/min (ref >60.00)
Glucose, Bld: 119 mg/dL — ABNORMAL HIGH (ref 70–99)
POTASSIUM: 3.9 meq/L (ref 3.5–5.1)
Sodium: 138 mEq/L (ref 135–145)

## 2014-09-07 NOTE — Patient Instructions (Signed)
Your physician recommends that you continue on your current medications as directed. Please refer to the Current Medication list given to you today.  Lab today: BMET  Remote monitoring is used to monitor your Pacemaker of ICD from home. This monitoring reduces the number of office visits required to check your device to one time per year. It allows us to keep an eye on the functioning of your device to ensure it is working properly. You are scheduled for a device check from home on 12/07/14. You may send your transmission at any time that day. If you have a wireless device, the transmission will be sent automatically. After your physician reviews your transmission, you will receive a postcard with your next transmission date.  Your physician wants you to follow-up in: 1 year with Dr. Klein.  You will receive a reminder letter in the mail two months in advance. If you don't receive a letter, please call our office to schedule the follow-up appointment.  

## 2014-09-07 NOTE — Progress Notes (Signed)
Patient Care Team: Gordy SaversPeter F Kwiatkowski, MD as PCP - General   HPI  Kyle [name] is a 81 y.o. 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 He underwent pacemaker implantation 3/14   He has had no syncope. Exercise tolerance is stable.  Echocardiogram 2014 was normal with mild pulmonary hypertension  He has had problems with hypertension. He had problems with edema. We stopped his amlodipine and increase his Lasix.  His blood pressures been well-controlled and his edema has resolved. He denies problems with shortness of breath or chest discomfort  Past Medical History  Diagnosis Date  . Diabetes mellitus   . Hypertension   . Hyperlipidemia   . Glaucoma   . Elevated PSA   . Benign prostatic hypertrophy   . Trifascicular block   . Intermittent complete heart block   . Pacemaker   . PAF (paroxysmal atrial fibrillation)     Detected on pacemaker; the duration 44 seconds    Past Surgical History  Procedure Laterality Date  . Tonsillectomy    . Cataract extraction, bilateral    . Permanent pacemaker insertion N/A 08/24/2012    Procedure: PERMANENT PACEMAKER INSERTION;  Surgeon: Duke SalviaSteven C Baker Kogler, MD;  [facility];  Service: Cardiovascular;  Laterality: N/A;    Current Outpatient Prescriptions  Medication Sig Dispense Refill  . albuterol (PROVENTIL,VENTOLIN) 90 MCG/ACT inhaler Inhale 2 puffs into the lungs 2 (two) times daily.      . cloNIDine (CATAPRES) 0.1 MG tablet TAKE (1) TABLET DAILY AT BEDTIME. 90 tablet 1  . doxazosin (CARDURA) 4 MG tablet Take 1 tablet (4 mg total) by mouth daily. 90 tablet 2  . furosemide (LASIX) 40 MG tablet Take 1 tablet (40 mg total) by mouth daily. 90 tablet 3  . glipiZIDE (GLUCOTROL) 5 MG tablet TAKE (1) TABLET TWICE A DAY BEFORE MEALS. 180 tablet 0  . glucose blood (PRECISION XTRA TEST STRIPS) test strip 1 each by Other route daily as needed for other. Use as instructed 100  each 3  . latanoprost (XALATAN) 0.005 % ophthalmic solution 1 drop at bedtime.    Marland Kitchen. lisinopril (PRINIVIL,ZESTRIL) 40 MG tablet TAKE 1 TABLET ONCE DAILY. 90 tablet 1  . metFORMIN (GLUCOPHAGE) 1000 MG tablet TAKE 1 TABLET TWICE DAILY. 180 tablet 0  . naproxen (NAPROSYN) 500 MG tablet TAKE (1) TABLET TWICE A DAY AS NEEDED. 180 tablet 3  . simvastatin (ZOCOR) 20 MG tablet TAKE ONE TABLET AT BEDTIME. 90 tablet 1   No current facility-administered medications for this visit.    No Known Allergies  Review of Systems negative except from HPI and PMH  Physical Exam BP 142/80 mmHg  Pulse 60  Ht 5\' 11"  (1.803 m)  Wt 192 lb (87.091 kg)  BMI 26.79 kg/m2 Well developed and well nourished in no acute distress HENT normal E scleral and icterus clear Neck Supple JVP flat; carotids brisk and full Clear to ausculation  Regular rate and rhythm, no murmurs +S4  \ Soft with active bowel sounds No clubbing cyanosis  Edema Alert and oriented, grossly normal motor and sensory function Skin Warm and Dry  ECG demonstrates AV pacing  Assessment and  Plan  Intermittent heart block  Paroxysmal atrial fibrillation detected on the device  Hypertension  Edema  Pacemaker mediated tachycardia  1 Degree AV block  Sinus node dysfunction  Pacemaker-St. Jude The patient's device was interrogated.  The information was reviewed. No changes were made  in the programming.     Edema has resolved, blood pressure is much improved. Continues with brief episodes of atrial fibrillation, the longest of which was 2 hours. This is likely below the appropriate threshold for anticoagulation. I suspect that number is any 12-24h range  The patient is atrially paced most of the time and heart rate excursion extends to 90 bpm range about 4% of the time. He also has PND at rates of 95-100. Because he has atrial sensing at most of his more rapid rates, DDI is not an option. I discussed with St. Jude and VIP is not included  an assessment of TARP this will limit his upper tracking rate--95 bpm but they should be okay as noted above.  He continues to conduct intrinsically about 50% of the time mostly in the 300 -350 ms range.

## 2014-09-14 DIAGNOSIS — R972 Elevated prostate specific antigen [PSA]: Secondary | ICD-10-CM | POA: Diagnosis not present

## 2014-09-15 ENCOUNTER — Other Ambulatory Visit (INDEPENDENT_AMBULATORY_CARE_PROVIDER_SITE_OTHER): Payer: Medicare Other

## 2014-09-15 DIAGNOSIS — E785 Hyperlipidemia, unspecified: Secondary | ICD-10-CM | POA: Diagnosis not present

## 2014-09-15 DIAGNOSIS — N400 Enlarged prostate without lower urinary tract symptoms: Secondary | ICD-10-CM | POA: Diagnosis not present

## 2014-09-15 DIAGNOSIS — R972 Elevated prostate specific antigen [PSA]: Secondary | ICD-10-CM

## 2014-09-15 DIAGNOSIS — E119 Type 2 diabetes mellitus without complications: Secondary | ICD-10-CM | POA: Diagnosis not present

## 2014-09-15 LAB — POCT URINALYSIS DIPSTICK
BILIRUBIN UA: NEGATIVE
Blood, UA: NEGATIVE
Glucose, UA: NEGATIVE
Ketones, UA: NEGATIVE
LEUKOCYTES UA: NEGATIVE
Nitrite, UA: NEGATIVE
PROTEIN UA: NEGATIVE
Spec Grav, UA: 1.01
Urobilinogen, UA: 0.2
pH, UA: 7

## 2014-09-15 LAB — LIPID PANEL
Cholesterol: 91 mg/dL (ref 0–200)
HDL: 32.8 mg/dL — ABNORMAL LOW (ref >39.00)
LDL Cholesterol: 32 mg/dL (ref 0–99)
NONHDL: 58.2
Total CHOL/HDL Ratio: 3
Triglycerides: 129 mg/dL (ref 0.0–149.0)
VLDL: 25.8 mg/dL (ref 0.0–40.0)

## 2014-09-15 LAB — CBC WITH DIFFERENTIAL/PLATELET
BASOS ABS: 0.1 10*3/uL (ref 0.0–0.1)
Basophils Relative: 0.6 % (ref 0.0–3.0)
Eosinophils Absolute: 0.3 10*3/uL (ref 0.0–0.7)
Eosinophils Relative: 3.5 % (ref 0.0–5.0)
HEMATOCRIT: 38.6 % — AB (ref 39.0–52.0)
Hemoglobin: 12.6 g/dL — ABNORMAL LOW (ref 13.0–17.0)
LYMPHS ABS: 3.8 10*3/uL (ref 0.7–4.0)
Lymphocytes Relative: 38.5 % (ref 12.0–46.0)
MCHC: 32.7 g/dL (ref 30.0–36.0)
MCV: 90.5 fl (ref 78.0–100.0)
Monocytes Absolute: 0.7 10*3/uL (ref 0.1–1.0)
Monocytes Relative: 7.1 % (ref 3.0–12.0)
Neutro Abs: 5 10*3/uL (ref 1.4–7.7)
Neutrophils Relative %: 50.3 % (ref 43.0–77.0)
PLATELETS: 168 10*3/uL (ref 150.0–400.0)
RBC: 4.26 Mil/uL (ref 4.22–5.81)
RDW: 14 % (ref 11.5–15.5)
WBC: 9.9 10*3/uL (ref 4.0–10.5)

## 2014-09-15 LAB — HEPATIC FUNCTION PANEL
ALBUMIN: 4 g/dL (ref 3.5–5.2)
ALK PHOS: 36 U/L — AB (ref 39–117)
ALT: 8 U/L (ref 0–53)
AST: 11 U/L (ref 0–37)
Bilirubin, Direct: 0.2 mg/dL (ref 0.0–0.3)
Total Bilirubin: 0.8 mg/dL (ref 0.2–1.2)
Total Protein: 6.6 g/dL (ref 6.0–8.3)

## 2014-09-15 LAB — BASIC METABOLIC PANEL
BUN: 12 mg/dL (ref 6–23)
CHLORIDE: 104 meq/L (ref 96–112)
CO2: 27 mEq/L (ref 19–32)
CREATININE: 0.69 mg/dL (ref 0.40–1.50)
Calcium: 9.6 mg/dL (ref 8.4–10.5)
GFR: 115.37 mL/min (ref >60.00)
GLUCOSE: 127 mg/dL — AB (ref 70–99)
Potassium: 4.3 mEq/L (ref 3.5–5.1)
Sodium: 139 mEq/L (ref 135–145)

## 2014-09-15 LAB — HEMOGLOBIN A1C: Hgb A1c MFr Bld: 7.4 % — ABNORMAL HIGH (ref 4.6–6.5)

## 2014-09-15 LAB — MICROALBUMIN / CREATININE URINE RATIO
Creatinine,U: 45.4 mg/dL
MICROALB/CREAT RATIO: 18.5 mg/g (ref 0.0–30.0)
Microalb, Ur: 8.4 mg/dL — ABNORMAL HIGH (ref 0.0–1.9)

## 2014-09-15 LAB — TSH: TSH: 1.17 u[IU]/mL (ref 0.35–4.50)

## 2014-09-15 LAB — PSA: PSA: 13.52 ng/mL — AB (ref 0.10–4.00)

## 2014-09-16 ENCOUNTER — Other Ambulatory Visit: Payer: Medicare Other

## 2014-09-20 ENCOUNTER — Encounter: Payer: Self-pay | Admitting: Internal Medicine

## 2014-09-20 ENCOUNTER — Ambulatory Visit (INDEPENDENT_AMBULATORY_CARE_PROVIDER_SITE_OTHER): Payer: Medicare Other | Admitting: Internal Medicine

## 2014-09-20 VITALS — BP 156/90 | HR 60 | Temp 97.6°F | Resp 20 | Ht 68.0 in | Wt 195.0 lb

## 2014-09-20 DIAGNOSIS — Z0000 Encounter for general adult medical examination without abnormal findings: Secondary | ICD-10-CM | POA: Diagnosis not present

## 2014-09-20 DIAGNOSIS — I10 Essential (primary) hypertension: Secondary | ICD-10-CM

## 2014-09-20 DIAGNOSIS — E785 Hyperlipidemia, unspecified: Secondary | ICD-10-CM

## 2014-09-20 DIAGNOSIS — E0842 Diabetes mellitus due to underlying condition with diabetic polyneuropathy: Secondary | ICD-10-CM

## 2014-09-20 DIAGNOSIS — I482 Chronic atrial fibrillation, unspecified: Secondary | ICD-10-CM

## 2014-09-20 DIAGNOSIS — I480 Paroxysmal atrial fibrillation: Secondary | ICD-10-CM

## 2014-09-20 NOTE — Patient Instructions (Signed)
Limit your sodium (Salt) intake  You need to lose weight.  Consider a lower calorie diet and regular exercise.  Return in 6 months for follow-up  Smoking tobacco is very bad for your health. You should stop smoking immediately.

## 2014-09-20 NOTE — Progress Notes (Signed)
Subjective:    Patient ID: Kyle [name], male    DOB: 17-Aug-1925, 81 y.o.   MRN: [id number]  HPI   Wt Readings from Last 3 Encounters:  09/20/14 195 lb (88.451 kg)  09/07/14 192 lb (87.091 kg)  06/21/14 194 lb (87.998 kg)   Subjective:    Patient ID: Kyle [name], male    DOB: 1926-05-22, 81 y.o.   MRN: [id number]  HPI History of Present Illness:   81  year-old patient who is seen today for a comprehensive evaluation.  Medical problems include trifascicular block.  The patient had a syncopal episode and is status post permanent pacemaker insertion 2014.  He is followed by cardiology.  He denies any exertional chest pain, shortness of breath.  He has treated dyslipidemia, hypertension, and type 2 diabetes. He is followed by urology for an elevated PSA and BPH. He is doing quite well.Marland Kitchen Unfortunately, he continues to smoke modestly.   He is also followed at [facility] and recent PFTs have revealed mild obstructive defect.  He denies much in the way of exercise limitations. He states that he walks several times weekly.  No falls over the past year   Preventive Screening-Counseling & Management  Alcohol-Tobacco  Smoking Cessation Counseling: yes   Allergies:  No Known Drug Allergies   Past History:  Past Medical History:   Diabetes mellitus, type II  Hypertension  Hyperlipidemia  glaucoma  Benign prostatic hypertrophy  elevated PSA  tobacco use   Past Surgical History:   Tonsillectomy  sigmoidoscopy 2001  Negative Cardiolite stress test March 2006  Status post insertion pacemaker for syncope and trifascicular block 2014  Family History:   Family History of Prostate CA 1st degree relative <50  Family History Other cancer-Breast  Fam hx MI  father died of congestive heart failure with a history of prostate cancer  Mother died age 68 Cardiac dysrrythmia   Social History:  Reviewed history from 12/24/2006 and no changes required.  Current Smoker  Retired   Married   1. Risk factors, based on past  M,S,F history- vascular risk factors include tobacco use hypertension dyslipidemia and diabetes  2.  Physical activities:No major limitations still mows his lawn occasionally  3.  Depression/mood:No history depression or mood disorder;  denies any depressive symptoms   4.  Hearing: Mild deficits 5.  ADL's:Independent in all aspects of daily living  6.  Fall risk: Moderate to high, but no falls in the past year  7.  Home safety:No problems identified  8.  Height weight, and visual acuity;Height and weight stable no change in visual acuity.  Followed annually by ophthalmology  9.  Counseling:Tobacco cessation discussed and encouraged  10. Lab orders based on risk factors:Laboratory profile including hemoglobin A1c and PSA discussed  11. Referral : Followup urology as scheduled  12. Care plan: more regular exercise modest weight loss tobacco cessation all encouraged  13. Cognitive assessment: Alert and oriented with normal affect. No cognitive dysfunction.  14.  Preventive services will include annual preventive care.  Examinations.  He'll be seen by ophthalmology annually and followed by cardiology Patient was provided with a written and personalized care plan  15.  Provider list includes primary care cardiology, ophthalmology and urology.  [facility]  Past Medical History  Diagnosis Date  . Diabetes mellitus   . Hypertension   . Hyperlipidemia   . Glaucoma   . Elevated PSA   . Benign prostatic hypertrophy  Past Surgical History  Procedure Date  . Tonsillectomy     reports that he has been smoking Cigarettes.  He has been smoking about .8 packs per day. He has never used smokeless tobacco. He reports that he drinks alcohol. He reports that he does not use illicit drugs. family history includes Cancer in his father and Heart disease in his father and mother. No Known Allergies  Past Medical  History  Diagnosis Date  . Diabetes mellitus   . Hypertension   . Hyperlipidemia   . Glaucoma   . Elevated PSA   . Benign prostatic hypertrophy   . Trifascicular block   . Intermittent complete heart block   . Pacemaker   . PAF (paroxysmal atrial fibrillation)     Detected on pacemaker; the duration 44 seconds    History   Social History  . Marital Status: Married    Spouse Name: N/A  . Number of Children: 4  . Years of Education: N/A   Occupational History  . Not on file.   Social History Main Topics  . Smoking status: Current Every Day Smoker -- 0.50 packs/day for 71 years    Types: Cigarettes  . Smokeless tobacco: Never Used  . Alcohol Use: 1.8 oz/week    1 Glasses of wine, 2 Shots of liquor per week  . Drug Use: No  . Sexual Activity: Not on file   Other Topics Concern  . Not on file   Social History Narrative   Lives at home with wife.      Past Surgical History  Procedure Laterality Date  . Tonsillectomy    . Cataract extraction, bilateral    . Permanent pacemaker insertion N/A 08/24/2012    Procedure: PERMANENT PACEMAKER INSERTION;  Surgeon: Duke SalviaSteven C Klein, MD;  [facility];  Service: Cardiovascular;  Laterality: N/A;    Family History  Problem Relation Age of Onset  . Heart disease Mother     atrial fib  . Heart disease Father [phone]    MI  . Cancer Father     prostate    No Known Allergies  Current Outpatient Prescriptions on File Prior to Visit  Medication Sig Dispense Refill  . albuterol (PROVENTIL,VENTOLIN) 90 MCG/ACT inhaler Inhale 2 puffs into the lungs 2 (two) times daily.      . cloNIDine (CATAPRES) 0.1 MG tablet TAKE (1) TABLET DAILY AT BEDTIME. 90 tablet 1  . doxazosin (CARDURA) 4 MG tablet Take 1 tablet (4 mg total) by mouth daily. 90 tablet 2  . furosemide (LASIX) 40 MG tablet Take 1 tablet (40 mg total) by mouth daily. 90 tablet 3  . glipiZIDE (GLUCOTROL) 5 MG tablet TAKE (1) TABLET TWICE A DAY BEFORE MEALS. 180 tablet 0   . glucose blood (PRECISION XTRA TEST STRIPS) test strip 1 each by Other route daily as needed for other. Use as instructed 100 each 3  . latanoprost (XALATAN) 0.005 % ophthalmic solution 1 drop at bedtime.    Marland Kitchen. lisinopril (PRINIVIL,ZESTRIL) 40 MG tablet TAKE 1 TABLET ONCE DAILY. 90 tablet 1  . metFORMIN (GLUCOPHAGE) 1000 MG tablet TAKE 1 TABLET TWICE DAILY. 180 tablet 0  . naproxen (NAPROSYN) 500 MG tablet TAKE (1) TABLET TWICE A DAY AS NEEDED. 180 tablet 3  . simvastatin (ZOCOR) 20 MG tablet TAKE ONE TABLET AT BEDTIME. 90 tablet 1   No current facility-administered medications on file prior to visit.    BP 156/90 mmHg  Pulse 60  Temp(Src) 97.6 F (36.4  C) (Oral)  Resp 20  Ht  (1.727 m)  Wt 195 lb (88.451 kg)  BMI 29.66 kg/m2  SpO2 96%         Review of Systems  Constitutional: Negative for fever, chills, activity change, appetite change and fatigue.  HENT: Negative for hearing loss, ear pain, congestion, rhinorrhea, sneezing, mouth sores, trouble swallowing, neck pain, neck stiffness, dental problem, voice change, sinus pressure and tinnitus.   Eyes: Negative for photophobia, pain, redness and visual disturbance.  Respiratory: Negative for apnea, cough, choking, chest tightness, shortness of breath and wheezing.   Cardiovascular: Negative for chest pain, palpitations and leg swelling.  Gastrointestinal: Negative for nausea, vomiting, abdominal pain, diarrhea, constipation, blood in stool, abdominal distention, anal bleeding and rectal pain.  Genitourinary: Negative for dysuria, urgency, frequency, hematuria, flank pain, decreased urine volume, discharge, penile swelling, scrotal swelling, difficulty urinating, genital sores and testicular pain.  Musculoskeletal: Negative for myalgias, back pain, joint swelling, arthralgias and gait problem.  Skin: Negative for color change, rash and wound.  Neurological: Negative for dizziness, tremors, seizures, syncope, facial  asymmetry, speech difficulty, weakness, light-headedness, numbness and headaches.  Hematological: Negative for adenopathy. Does not bruise/bleed easily.  Psychiatric/Behavioral: Negative for suicidal ideas, hallucinations, behavioral problems, confusion, sleep disturbance, self-injury, dysphoric mood, decreased concentration and agitation. The patient is not nervous/anxious.        Objective:   Physical Exam  Constitutional: He appears well-developed and well-nourished.  HENT:  Head: Normocephalic and atraumatic.  Right Ear: External ear normal.  Left Ear: External ear normal.  Nose: Nose normal.  Mouth/Throat: Oropharynx is clear and moist.  Eyes: Conjunctivae and EOM are normal. Pupils are equal, round, and reactive to light. No scleral icterus.  Neck: Normal range of motion. Neck supple. No JVD present. No thyromegaly present.  Cardiovascular: irregular rhythm, normal heart sounds and intact distal pulses.  Exam reveals no gallop and no friction rub.  No murmur heard.      Left supraclavicular and  bilateral femoral bruits Trace edema dorsalis pedis pulses faint.  Posterior tibial pulses full Pulmonary/Chest: Effort normal and breath sounds normal. He exhibits no tenderness.  Abdominal: Soft. Bowel sounds are normal. He exhibits no distension and no mass. There is no tenderness.  Genitourinary: Penis normal.  Musculoskeletal: Normal range of motion. He exhibits no edema and no tenderness.  Lymphadenopathy:    He has no cervical adenopathy.  Neurological: He is alert. He has normal reflexes. No cranial nerve deficit. Coordination normal.  Skin: Skin is warm and dry. No rash noted.  Psychiatric: He has a normal mood and affect. His behavior is normal.          Assessment & Plan:  Preventive health examination Hypertension stable BPH with increased PSA.  Follow-up urology  Dyslipidemia.  Continue low intensity statin therapy  Type 2 diabetes.  Hemoglobin A1c acceptable.   Continue present therapy  Tobacco use.  Total smoking cessation encouraged   Total cessation tobacco encouraged. Modest weight loss and a more regular exercise regimen also recommended;  return in 3 months for followup   Review of Systems   as above    Objective:   Physical Exam    As above        Assessment & Plan:      As above

## 2014-09-20 NOTE — Progress Notes (Signed)
Pre visit review using our clinic review tool, if applicable. No additional management support is needed unless otherwise documented below in the visit note. 

## 2014-09-21 DIAGNOSIS — R972 Elevated prostate specific antigen [PSA]: Secondary | ICD-10-CM | POA: Diagnosis not present

## 2014-09-23 ENCOUNTER — Encounter: Payer: Medicare Other | Admitting: Internal Medicine

## 2014-10-31 ENCOUNTER — Other Ambulatory Visit: Payer: Self-pay | Admitting: Internal Medicine

## 2014-11-03 ENCOUNTER — Other Ambulatory Visit: Payer: Self-pay | Admitting: Internal Medicine

## 2014-11-18 ENCOUNTER — Other Ambulatory Visit: Payer: Self-pay | Admitting: Internal Medicine

## 2014-12-07 ENCOUNTER — Encounter: Payer: Self-pay | Admitting: Internal Medicine

## 2014-12-07 ENCOUNTER — Ambulatory Visit (INDEPENDENT_AMBULATORY_CARE_PROVIDER_SITE_OTHER): Payer: Medicare Other | Admitting: *Deleted

## 2014-12-07 DIAGNOSIS — I480 Paroxysmal atrial fibrillation: Secondary | ICD-10-CM

## 2014-12-07 NOTE — Progress Notes (Signed)
Remote pacemaker transmission.   

## 2014-12-11 LAB — CUP PACEART REMOTE DEVICE CHECK
Battery Remaining Longevity: 112 mo
Battery Voltage: 2.95 V
Brady Statistic AP VP Percent: 46 %
Brady Statistic AS VP Percent: 8.9 %
Brady Statistic AS VS Percent: 30 %
Date Time Interrogation Session: 20160622124158
Lead Channel Impedance Value: 700 Ohm
Lead Channel Pacing Threshold Amplitude: 0.5 V
Lead Channel Pacing Threshold Pulse Width: 0.4 ms
Lead Channel Sensing Intrinsic Amplitude: 12 mV
Lead Channel Sensing Intrinsic Amplitude: 2.7 mV
Lead Channel Setting Pacing Amplitude: 2 V
Lead Channel Setting Pacing Amplitude: 2.5 V
Lead Channel Setting Sensing Sensitivity: 2 mV
MDC IDC MSMT BATTERY REMAINING PERCENTAGE: 91 %
MDC IDC MSMT LEADCHNL RA IMPEDANCE VALUE: 380 Ohm
MDC IDC MSMT LEADCHNL RA PACING THRESHOLD AMPLITUDE: 0.5 V
MDC IDC MSMT LEADCHNL RV PACING THRESHOLD PULSEWIDTH: 0.4 ms
MDC IDC SET LEADCHNL RV PACING PULSEWIDTH: 0.4 ms
MDC IDC STAT BRADY AP VS PERCENT: 11 %
MDC IDC STAT BRADY RA PERCENT PACED: 55 %
MDC IDC STAT BRADY RV PERCENT PACED: 55 %
Pulse Gen Model: 2110
Pulse Gen Serial Number: 7379731

## 2014-12-12 ENCOUNTER — Other Ambulatory Visit: Payer: Self-pay | Admitting: Internal Medicine

## 2014-12-26 DIAGNOSIS — H40013 Open angle with borderline findings, low risk, bilateral: Secondary | ICD-10-CM | POA: Diagnosis not present

## 2015-01-05 ENCOUNTER — Telehealth: Payer: Self-pay | Admitting: *Deleted

## 2015-01-05 NOTE — Telephone Encounter (Signed)
Follow up ° ° ° ° °Pt returning your call. °Please call to discuss °

## 2015-01-05 NOTE — Telephone Encounter (Signed)
Informed patient of monitor showing more frequent AFib episodes and need to discuss possible need for anticoagulations. Patient verbalized understanding and knows our office will call him to arrange OV

## 2015-01-05 NOTE — Telephone Encounter (Signed)
Left message for patient to call back.  (when calls back - inform him monitor showed more frequent AFib.  Per DGraciela Husbands Klein need to arrange OV w/ Rudi Coco in AFib clinic to discuss anticoagulation)

## 2015-01-11 ENCOUNTER — Ambulatory Visit (HOSPITAL_COMMUNITY)
Admission: RE | Admit: 2015-01-11 | Discharge: 2015-01-11 | Disposition: A | Discharge status: Home / Self Care | Payer: Medicare Other | Source: Ambulatory Visit | Attending: Nurse Practitioner | Admitting: Nurse Practitioner

## 2015-01-11 ENCOUNTER — Encounter (HOSPITAL_COMMUNITY): Payer: Self-pay | Admitting: Nurse Practitioner

## 2015-01-11 VITALS — BP 114/80 | HR 71 | Ht 68.0 in | Wt 189.4 lb

## 2015-01-11 DIAGNOSIS — I10 Essential (primary) hypertension: Secondary | ICD-10-CM | POA: Diagnosis not present

## 2015-01-11 DIAGNOSIS — E785 Hyperlipidemia, unspecified: Secondary | ICD-10-CM | POA: Insufficient documentation

## 2015-01-11 DIAGNOSIS — I4891 Unspecified atrial fibrillation: Secondary | ICD-10-CM | POA: Diagnosis present

## 2015-01-11 DIAGNOSIS — E1142 Type 2 diabetes mellitus with diabetic polyneuropathy: Secondary | ICD-10-CM | POA: Insufficient documentation

## 2015-01-11 DIAGNOSIS — I452 Bifascicular block: Secondary | ICD-10-CM | POA: Insufficient documentation

## 2015-01-11 DIAGNOSIS — Z79899 Other long term (current) drug therapy: Secondary | ICD-10-CM | POA: Insufficient documentation

## 2015-01-11 DIAGNOSIS — I480 Paroxysmal atrial fibrillation: Secondary | ICD-10-CM

## 2015-01-11 DIAGNOSIS — F17210 Nicotine dependence, cigarettes, uncomplicated: Secondary | ICD-10-CM | POA: Insufficient documentation

## 2015-01-11 DIAGNOSIS — Z950 Presence of cardiac pacemaker: Secondary | ICD-10-CM | POA: Diagnosis not present

## 2015-01-11 DIAGNOSIS — Z8249 Family history of ischemic heart disease and other diseases of the circulatory system: Secondary | ICD-10-CM | POA: Diagnosis not present

## 2015-01-12 ENCOUNTER — Encounter (HOSPITAL_COMMUNITY): Payer: Self-pay | Admitting: Nurse Practitioner

## 2015-01-12 NOTE — Progress Notes (Signed)
Patient ID: Kyle [name], male   DOB: 04-Dec-1925, 81 y.o.   MRN: [id number]     Primary Care Physician: Rogelia Boga, MD Referring Physician: Dr. Georgia Lopes P Kyle [name] is a 82 y.o. male with a h/o HTN, Dyslipidemia, DM with poly neuropathy, PAF, S/P PPM for trifascicular block and syncope. When last seen by Dr. Graciela Husbands in March, he mentioned that PAF was revealed on PPM interrogation but episodes were brief and he did not think warranted anticoagulants.  However, last interrogation revealed longer afib episodes and thought that anticoagulants discussion should be had with pt. He is her with son today. He is not aware of afib episodes. He does walk with a cane. He does report that he had a fall over the weekend where he was going to the [facility] store and stepped up on a high curb and lost his balance and fell backwards, striking his head. EMS checked out on the scene and pt drove his self home. He does drink alcohol on a nightly basis and does not want to change his drinking habits.   Today, he denies symptoms of palpitations, chest pain, shortness of breath, orthopnea, PND, lower extremity edema, dizziness, presyncope, syncope, or neurologic sequela. The patient is tolerating medications without difficulties and is otherwise without complaint today.   Past Medical History  Diagnosis Date  . Diabetes mellitus   . Hypertension   . Hyperlipidemia   . Glaucoma   . Elevated PSA   . Benign prostatic hypertrophy   . Trifascicular block   . Intermittent complete heart block   . Pacemaker   . PAF (paroxysmal atrial fibrillation)     Detected on pacemaker; the duration 44 seconds   Past Surgical History  Procedure Laterality Date  . Tonsillectomy    . Cataract extraction, bilateral    . Permanent pacemaker insertion N/A 08/24/2012    Procedure: PERMANENT PACEMAKER INSERTION;  Surgeon: Duke Salvia, MD;  Location: [facility] CATH LAB;  Service: Cardiovascular;  Laterality: N/A;    Current  Outpatient Prescriptions  Medication Sig Dispense Refill  . albuterol (PROVENTIL,VENTOLIN) 90 MCG/ACT inhaler Inhale 2 puffs into the lungs 2 (two) times daily.      . cloNIDine (CATAPRES) 0.1 MG tablet TAKE (1) TABLET DAILY AT BEDTIME. 90 tablet 1  . doxazosin (CARDURA) 4 MG tablet TAKE 1 TABLET EACH DAY. 90 tablet 0  . furosemide (LASIX) 40 MG tablet Take 1 tablet (40 mg total) by mouth daily. 90 tablet 3  . glipiZIDE (GLUCOTROL) 5 MG tablet TAKE (1) TABLET TWICE A DAY BEFORE MEALS. 180 tablet 3  . glucose blood (PRECISION XTRA TEST STRIPS) test strip 1 each by Other route daily as needed for other. Use as instructed 100 each 3  . latanoprost (XALATAN) 0.005 % ophthalmic solution 1 drop at bedtime.    Marland Kitchen lisinopril (PRINIVIL,ZESTRIL) 40 MG tablet TAKE 1 TABLET ONCE DAILY. 90 tablet 1  . metFORMIN (GLUCOPHAGE) 1000 MG tablet TAKE 1 TABLET TWICE DAILY. 180 tablet 5  . naproxen (NAPROSYN) 500 MG tablet TAKE (1) TABLET TWICE A DAY AS NEEDED. 180 tablet 3  . simvastatin (ZOCOR) 20 MG tablet TAKE ONE TABLET AT BEDTIME. 90 tablet 2   No current facility-administered medications for this encounter.    No Known Allergies  History   Social History  . Marital Status: Married    Spouse Name: N/A  . Number of Children: 4  . Years of Education: N/A   Occupational History  . Not  on file.   Social History Main Topics  . Smoking status: Current Every Day Smoker -- 0.50 packs/day for 71 years    Types: Cigarettes  . Smokeless tobacco: Never Used  . Alcohol Use: 1.8 oz/week    1 Glasses of wine, 2 Shots of liquor per week  . Drug Use: No  . Sexual Activity: Not on file   Other Topics Concern  . Not on file   Social History Narrative   Lives at home with wife.      Family History  Problem Relation Age of Onset  . Heart disease Mother     atrial fib  . Heart disease Father 76    MI  . Cancer Father     prostate    ROS- All systems are reviewed and negative except as per the HPI  above  Physical Exam: Filed Vitals:   01/11/15 1121  BP: 114/80  Pulse: 71  Height: 5\' 8"  (1.727 m)  Weight: 189 lb 6.4 oz (85.911 kg)    GEN- The patient is elderly appearing, alert and oriented x 3 today.  Using a cane. Head- normocephalic, atraumatic Eyes-  Sclera clear, conjunctiva pink Ears- hearing intact Oropharynx- clear Neck- supple, no JVP Lymph- no cervical lymphadenopathy Lungs- Clear to ausculation bilaterally, normal work of breathing Heart- Regular rate and rhythm, no murmurs, rubs or gallops, PMI not laterally displaced GI- soft, NT, ND, + BS Extremities- no clubbing, cyanosis, or edema MS- no significant deformity or atrophy Skin- no rash or lesion Psych- euthymic mood, full affect Neuro- strength and sensation are intact  EKG- SR with PAC's. LAFB, RBBB, Bifascicular block .     Assessment and Plan: 1. PAF Pt currently is not bothered with afib episodes. No rate/rhythm control is indicated at this time. Continue to monitor thru PPM interrogation.   2. Chadsvasc score of at least 4 Long discussion(x one hour) to inform pt and son of  available anticoagulants, risk vrs benefit of being on a blood thinner,  pt's risk of blood thinner with falls and risk of bleeding with alcohol use discussed. Pt is not willing to cut back or eliminate alcohol to decrease his bleeding risk. He does not want to use a blood thinner at this time, his compromise is to take a baby asa, although this was not given to him as a option. He and the son are aware of risk of stroke not on full anticoagulation. He will call me if he changes his mind after full discussion with other family members.   F/u with Dr. Graciela Husbands and device clinic as scheduled and in the afib clinic as needed.

## 2015-03-06 ENCOUNTER — Other Ambulatory Visit: Payer: Self-pay | Admitting: Internal Medicine

## 2015-03-13 ENCOUNTER — Telehealth: Payer: Self-pay | Admitting: Cardiology

## 2015-03-13 ENCOUNTER — Encounter: Payer: Medicare Other | Admitting: *Deleted

## 2015-03-13 NOTE — Telephone Encounter (Signed)
LMOVM reminding pt to send remote transmission.   

## 2015-03-14 ENCOUNTER — Encounter: Payer: Self-pay | Admitting: Cardiology

## 2015-03-17 ENCOUNTER — Telehealth: Payer: Self-pay | Admitting: Internal Medicine

## 2015-03-17 NOTE — Telephone Encounter (Signed)
LMOVM for pt to return call 

## 2015-03-17 NOTE — Telephone Encounter (Signed)
NewMessage  Pt calling to speak w/ Device concerning remote transmission that was sched for 9/26. Please call back and discuss.

## 2015-03-20 NOTE — Telephone Encounter (Signed)
LMOVM for pt to return call 

## 2015-03-20 NOTE — Telephone Encounter (Signed)
Spoke w/ pt and informed him that we still have not received his transmission. Pt is upset and is going to call tech services if he can not get monitor working soon he may just come to office to have device checked.

## 2015-03-20 NOTE — Telephone Encounter (Signed)
Spoke w/ pt and informed him that we still have not received his remote transmission. Pt verbalized understanding and will try again later today pt is going to call after he sends the transmission to see if we received it.

## 2015-03-20 NOTE — Telephone Encounter (Signed)
Follow up     Returning a call to [facility]

## 2015-03-22 ENCOUNTER — Encounter: Payer: Self-pay | Admitting: Internal Medicine

## 2015-03-22 ENCOUNTER — Ambulatory Visit (INDEPENDENT_AMBULATORY_CARE_PROVIDER_SITE_OTHER): Payer: Medicare Other | Admitting: Internal Medicine

## 2015-03-22 VITALS — BP 160/66 | HR 62 | Temp 98.2°F | Resp 20 | Ht 68.0 in | Wt 192.0 lb

## 2015-03-22 DIAGNOSIS — I480 Paroxysmal atrial fibrillation: Secondary | ICD-10-CM | POA: Diagnosis not present

## 2015-03-22 DIAGNOSIS — E785 Hyperlipidemia, unspecified: Secondary | ICD-10-CM

## 2015-03-22 DIAGNOSIS — I10 Essential (primary) hypertension: Secondary | ICD-10-CM | POA: Diagnosis not present

## 2015-03-22 DIAGNOSIS — E0842 Diabetes mellitus due to underlying condition with diabetic polyneuropathy: Secondary | ICD-10-CM | POA: Diagnosis not present

## 2015-03-22 LAB — HEMOGLOBIN A1C: HEMOGLOBIN A1C: 7.1 % — AB (ref 4.6–6.5)

## 2015-03-22 NOTE — Progress Notes (Signed)
Pre visit review using our clinic review tool, if applicable. No additional management support is needed unless otherwise documented below in the visit note. 

## 2015-03-22 NOTE — Progress Notes (Signed)
Subjective:    Patient ID: Kyle [name], male    DOB: 05-20-1926, 81 y.o.   MRN: [id number]  HPI  Lab Results  Component Value Date   HGBA1C 7.4* 09/15/2014    BP Readings from Last 3 Encounters:  03/22/15 160/66  01/11/15 114/80  09/20/14 156/90    Wt Readings from Last 3 Encounters:  03/22/15 192 lb (87.091 kg)  01/11/15 189 lb 6.4 oz (85.911 kg)  09/20/14 195 lb (88.79 kg)   81 year old patient who is seen today for his six-month follow-up.  He is followed by cardiology with a history of trifascicular block, history of syncope, status post PPM in 2014.  He continues to do well.  No palpitations. He has type 2 diabetes which has been controlled on oral medications.  He has essential hypertension.  Blood pressure is followed periodically at his assisted living facility No concerns or complaints. He did have his annual eye examination in July of this year  Past Medical History  Diagnosis Date  . Diabetes mellitus   . Hypertension   . Hyperlipidemia   . Glaucoma   . Elevated PSA   . Benign prostatic hypertrophy   . Trifascicular block   . Intermittent complete heart block (HCC)   . Pacemaker   . PAF (paroxysmal atrial fibrillation) (HCC)     Detected on pacemaker; the duration 44 seconds    Social History   Social History  . Marital Status: Married    Spouse Name: N/A  . Number of Children: 4  . Years of Education: N/A   Occupational History  . Not on file.   Social History Main Topics  . Smoking status: Current Every Day Smoker -- 0.50 packs/day for 71 years    Types: Cigarettes  . Smokeless tobacco: Never Used  . Alcohol Use: 1.8 oz/week    1 Glasses of wine, 2 Shots of liquor per week  . Drug Use: No  . Sexual Activity: Not on file   Other Topics Concern  . Not on file   Social History Narrative   Lives at home with wife.      Past Surgical History  Procedure Laterality Date  . Tonsillectomy    . Cataract extraction, bilateral    .  Permanent pacemaker insertion N/A 08/24/2012    Procedure: PERMANENT PACEMAKER INSERTION;  Surgeon: Duke Salvia, MD;  Location: [facility] CATH LAB;  Service: Cardiovascular;  Laterality: N/A;    Family History  Problem Relation Age of Onset  . Heart disease Mother     atrial fib  . Heart disease Father 40    MI  . Cancer Father     prostate    No Known Allergies  Current Outpatient Prescriptions on File Prior to Visit  Medication Sig Dispense Refill  . albuterol (PROVENTIL,VENTOLIN) 90 MCG/ACT inhaler Inhale 2 puffs into the lungs 2 (two) times daily.      . cloNIDine (CATAPRES) 0.1 MG tablet TAKE (1) TABLET DAILY AT BEDTIME. 90 tablet 1  . doxazosin (CARDURA) 4 MG tablet TAKE 1 TABLET EACH DAY. 90 tablet 1  . furosemide (LASIX) 40 MG tablet Take 1 tablet (40 mg total) by mouth daily. 90 tablet 3  . glipiZIDE (GLUCOTROL) 5 MG tablet TAKE (1) TABLET TWICE A DAY BEFORE MEALS. 180 tablet 3  . glucose blood (PRECISION XTRA TEST STRIPS) test strip 1 each by Other route daily as needed for other. Use as instructed 100 each 3  . latanoprost (XALATAN) 0.005 %  ophthalmic solution 1 drop at bedtime.    Marland Kitchen lisinopril (PRINIVIL,ZESTRIL) 40 MG tablet TAKE 1 TABLET ONCE DAILY. 90 tablet 1  . metFORMIN (GLUCOPHAGE) 1000 MG tablet TAKE 1 TABLET TWICE DAILY. 180 tablet 5  . naproxen (NAPROSYN) 500 MG tablet TAKE (1) TABLET TWICE A DAY AS NEEDED. 180 tablet 3  . simvastatin (ZOCOR) 20 MG tablet TAKE ONE TABLET AT BEDTIME. 90 tablet 2   No current facility-administered medications on file prior to visit.    BP 160/66 mmHg  Pulse 62  Temp(Src) 98.2 F (36.8 C) (Oral)  Resp 20  Ht  (1.727 m)  Wt 192 lb (87.091 kg)  BMI 29.20 kg/m2  SpO2 95%       Review of Systems  Constitutional: Negative for fever, chills, appetite change and fatigue.  HENT: Negative for congestion, dental problem, ear pain, hearing loss, sore throat, tinnitus, trouble swallowing and voice change.   Eyes: Negative for  pain, discharge and visual disturbance.  Respiratory: Negative for cough, chest tightness, wheezing and stridor.   Cardiovascular: Negative for chest pain, palpitations and leg swelling.  Gastrointestinal: Negative for nausea, vomiting, abdominal pain, diarrhea, constipation, blood in stool and abdominal distention.  Genitourinary: Negative for urgency, hematuria, flank pain, discharge, difficulty urinating and genital sores.  Musculoskeletal: Negative for myalgias, back pain, joint swelling, arthralgias, gait problem and neck stiffness.  Skin: Negative for rash.  Neurological: Negative for dizziness, syncope, speech difficulty, weakness, numbness and headaches.  Hematological: Negative for adenopathy. Does not bruise/bleed easily.  Psychiatric/Behavioral: Negative for behavioral problems and dysphoric mood. The patient is not nervous/anxious.        Objective:   Physical Exam  Constitutional: He is oriented to person, place, and time. He appears well-developed.  HENT:  Head: Normocephalic.  Right Ear: External ear normal.  Left Ear: External ear normal.  Eyes: Conjunctivae and EOM are normal.  Neck: Normal range of motion.  Cardiovascular: Normal rate and normal heart sounds.   Pulmonary/Chest: Breath sounds normal.  Abdominal: Bowel sounds are normal.  Musculoskeletal: Normal range of motion. He exhibits no edema or tenderness.  Neurological: He is alert and oriented to person, place, and time.  Psychiatric: He has a normal mood and affect. His behavior is normal.          Assessment & Plan:   Diabetes mellitus.  Will check hemoglobin A1c.  Lipid profile and urine for microalbumin checked 7 months ago Essential hypertension Paroxysmal atrial fibrillation.  Follow-up cardiology Status post permanent pacemaker for trifascicular block  CPX 6 months

## 2015-03-22 NOTE — Patient Instructions (Signed)
Limit your sodium (Salt) intake   Please check your hemoglobin A1c every 6 months  Please check your blood pressure on a regular basis.  If it is consistently greater than 150/90, please make an office appointment.    

## 2015-04-05 DIAGNOSIS — R972 Elevated prostate specific antigen [PSA]: Secondary | ICD-10-CM | POA: Diagnosis not present

## 2015-04-12 DIAGNOSIS — R972 Elevated prostate specific antigen [PSA]: Secondary | ICD-10-CM | POA: Diagnosis not present

## 2015-05-13 ENCOUNTER — Other Ambulatory Visit: Payer: Self-pay | Admitting: Internal Medicine

## 2015-05-16 ENCOUNTER — Other Ambulatory Visit: Payer: Self-pay | Admitting: Internal Medicine

## 2015-08-11 DIAGNOSIS — H02102 Unspecified ectropion of right lower eyelid: Secondary | ICD-10-CM | POA: Diagnosis not present

## 2015-08-11 DIAGNOSIS — E119 Type 2 diabetes mellitus without complications: Secondary | ICD-10-CM | POA: Diagnosis not present

## 2015-08-11 DIAGNOSIS — H40011 Open angle with borderline findings, low risk, right eye: Secondary | ICD-10-CM | POA: Diagnosis not present

## 2015-08-11 DIAGNOSIS — H40012 Open angle with borderline findings, low risk, left eye: Secondary | ICD-10-CM | POA: Diagnosis not present

## 2015-08-11 LAB — HM DIABETES EYE EXAM

## 2015-08-17 ENCOUNTER — Encounter: Payer: Self-pay | Admitting: Internal Medicine

## 2015-08-17 ENCOUNTER — Other Ambulatory Visit: Payer: Self-pay | Admitting: Internal Medicine

## 2015-08-28 ENCOUNTER — Other Ambulatory Visit: Payer: Self-pay | Admitting: Internal Medicine

## 2015-09-11 ENCOUNTER — Encounter: Payer: Self-pay | Admitting: Internal Medicine

## 2015-09-11 ENCOUNTER — Ambulatory Visit (INDEPENDENT_AMBULATORY_CARE_PROVIDER_SITE_OTHER): Payer: Medicare Other | Admitting: Internal Medicine

## 2015-09-11 VITALS — BP 152/70 | HR 86 | Ht 68.0 in | Wt 192.2 lb

## 2015-09-11 DIAGNOSIS — I495 Sick sinus syndrome: Secondary | ICD-10-CM | POA: Diagnosis not present

## 2015-09-11 DIAGNOSIS — I480 Paroxysmal atrial fibrillation: Secondary | ICD-10-CM

## 2015-09-11 DIAGNOSIS — I442 Atrioventricular block, complete: Secondary | ICD-10-CM | POA: Diagnosis not present

## 2015-09-11 DIAGNOSIS — Z950 Presence of cardiac pacemaker: Secondary | ICD-10-CM

## 2015-09-11 LAB — CBC WITH DIFFERENTIAL/PLATELET
Basophils Absolute: 0.1 10*3/uL (ref 0.0–0.1)
Basophils Relative: 1 % (ref 0–1)
EOS ABS: 0.2 10*3/uL (ref 0.0–0.7)
EOS PCT: 2 % (ref 0–5)
HEMATOCRIT: 39.1 % (ref 39.0–52.0)
Hemoglobin: 12.5 g/dL — ABNORMAL LOW (ref 13.0–17.0)
LYMPHS ABS: 4.4 10*3/uL — AB (ref 0.7–4.0)
LYMPHS PCT: 38 % (ref 12–46)
MCH: 28.6 pg (ref 26.0–34.0)
MCHC: 32 g/dL (ref 30.0–36.0)
MCV: 89.5 fL (ref 78.0–100.0)
MONO ABS: 0.9 10*3/uL (ref 0.1–1.0)
MONOS PCT: 8 % (ref 3–12)
MPV: 10.8 fL (ref 8.6–12.4)
Neutro Abs: 6 10*3/uL (ref 1.7–7.7)
Neutrophils Relative %: 51 % (ref 43–77)
PLATELETS: 224 10*3/uL (ref 150–400)
RBC: 4.37 MIL/uL (ref 4.22–5.81)
RDW: 14.1 % (ref 11.5–15.5)
WBC: 11.7 10*3/uL — ABNORMAL HIGH (ref 4.0–10.5)

## 2015-09-11 LAB — CUP PACEART INCLINIC DEVICE CHECK
Battery Remaining Longevity: 110.4
Battery Voltage: 2.95 V
Brady Statistic RA Percent Paced: 52 %
Brady Statistic RV Percent Paced: 54 %
Date Time Interrogation Session: 20170327153629
Implantable Lead Implant Date: 20140310
Implantable Lead Implant Date: 20140310
Implantable Lead Location: 753859
Implantable Lead Location: 753860
Implantable Lead Model: 1948
Lead Channel Impedance Value: 362.5 Ohm
Lead Channel Impedance Value: 675 Ohm
Lead Channel Pacing Threshold Amplitude: 0.5 V
Lead Channel Pacing Threshold Amplitude: 0.5 V
Lead Channel Pacing Threshold Pulse Width: 0.4 ms
Lead Channel Pacing Threshold Pulse Width: 0.4 ms
Lead Channel Sensing Intrinsic Amplitude: 12 mV
Lead Channel Sensing Intrinsic Amplitude: 2.4 mV
Lead Channel Setting Pacing Amplitude: 2 V
Lead Channel Setting Pacing Amplitude: 2.5 V
Lead Channel Setting Pacing Pulse Width: 0.4 ms
Lead Channel Setting Sensing Sensitivity: 2 mV
Pulse Gen Model: 2110
Pulse Gen Serial Number: 7379731

## 2015-09-11 LAB — BASIC METABOLIC PANEL
BUN: 14 mg/dL (ref 7–25)
CALCIUM: 9.9 mg/dL (ref 8.6–10.3)
CO2: 30 mmol/L (ref 20–31)
Chloride: 102 mmol/L (ref 98–110)
Creat: 0.92 mg/dL (ref 0.70–1.11)
GLUCOSE: 105 mg/dL — AB (ref 65–99)
Potassium: 4.2 mmol/L (ref 3.5–5.3)
SODIUM: 141 mmol/L (ref 135–146)

## 2015-09-11 LAB — TSH: TSH: 1.06 mIU/L (ref 0.40–4.50)

## 2015-09-11 MED ORDER — METOPROLOL SUCCINATE ER 50 MG PO TB24: 50.0000 mg | ORAL_TABLET | Freq: Every day | ORAL | Status: DC

## 2015-09-11 NOTE — Patient Instructions (Addendum)
Medication Instructions: 1) Stop aspirin (once you start the new blood thinner) 2) Stop clonidine 3) Start toprol (metoprolol succinate) 50 mg one tablet by mouth once daily  Labwork: - Your physician recommends that you have lab work today: BMP/ CBC/ TSH  Procedures/Testing: - none  Follow-Up: - Your physician recommends that you schedule a follow-up appointment in: 6 weeks with Rudi Cocoonna Carroll, NP in the a-fib clinic.  Any Additional Special Instructions Will Be Listed Below (If Applicable). ** Please call Jalesa Thien, RN for Dr. Graciela HusbandsKlein back after you find out which blood thinner is the most cost efficient for you. You may check this at your pharmacy.** 1) Eliquis (one tablet twice daily) 2) Pradaxa (one tablet twice daily) 3) Xarelto (one tablet once daily    If you need a refill on your cardiac medications before your next appointment, please call your pharmacy.

## 2015-09-11 NOTE — Progress Notes (Signed)
Patient Care Team: Gordy SaversPeter F Kwiatkowski, MD as PCP - General   HPI  Kyle [name] is a 82 y.o. 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 He underwent pacemaker implantation 3/14   He has had no syncope. Exercise tolerance is stable. He has had no palpitations  Echocardiogram 2014 was normal with mild pulmonary hypertension     Past Medical History  Diagnosis Date  . Diabetes mellitus   . Hypertension   . Hyperlipidemia   . Glaucoma   . Elevated PSA   . Benign prostatic hypertrophy   . Trifascicular block   . Intermittent complete heart block (HCC)   . Pacemaker   . PAF (paroxysmal atrial fibrillation) (HCC)     Detected on pacemaker; the duration 44 seconds    Past Surgical History  Procedure Laterality Date  . Tonsillectomy    . Cataract extraction, bilateral    . Permanent pacemaker insertion N/A 08/24/2012    Procedure: PERMANENT PACEMAKER INSERTION;  Surgeon: Duke SalviaSteven C Ardythe Klute, MD;  [facility];  Service: Cardiovascular;  Laterality: N/A;    Current Outpatient Prescriptions  Medication Sig Dispense Refill  . albuterol (PROVENTIL,VENTOLIN) 90 MCG/ACT inhaler Inhale 2 puffs into the lungs 2 (two) times daily.      Marland Kitchen. aspirin 81 MG tablet Take 81 mg by mouth daily.    . cloNIDine (CATAPRES) 0.1 MG tablet TAKE (1) TABLET DAILY AT BEDTIME. 90 tablet 1  . doxazosin (CARDURA) 4 MG tablet TAKE 1 TABLET EACH DAY. 90 tablet 1  . furosemide (LASIX) 40 MG tablet TAKE 1 TABLET EACH DAY. 90 tablet 1  . glipiZIDE (GLUCOTROL) 5 MG tablet TAKE (1) TABLET TWICE A DAY BEFORE MEALS. 180 tablet 3  . glucose blood (PRECISION XTRA TEST STRIPS) test strip 1 each by Other route daily as needed for other. Use as instructed 100 each 3  . latanoprost (XALATAN) 0.005 % ophthalmic solution 1 drop at bedtime.    Marland Kitchen. lisinopril (PRINIVIL,ZESTRIL) 40 MG tablet TAKE 1 TABLET ONCE DAILY. 90 tablet 3  . metFORMIN (GLUCOPHAGE) 1000  MG tablet TAKE 1 TABLET TWICE DAILY. 180 tablet 5  . naproxen (NAPROSYN) 500 MG tablet TAKE (1) TABLET TWICE A DAY AS NEEDED. (Patient taking differently: Take 500 mg by mouth 2 (two) times daily with a meal. TAKE (1) TABLET TWICE A DAY AS NEEDED.) 180 tablet 3  . simvastatin (ZOCOR) 20 MG tablet TAKE ONE TABLET AT BEDTIME. 90 tablet 3   No current facility-administered medications for this visit.    No Known Allergies  Review of Systems negative except from HPI and PMH  Physical Exam Ht 5\' 8"  (1.727 m)  Wt 192 lb 3.2 oz (87.181 kg)  BMI 29.23 kg/m2 Well developed and well nourished in no acute distress HENT normal E scleral and icterus clear Neck Supple JVP flat; carotids brisk and full Clear to ausculation  Regular rate and rhythm, no murmurs +S4  \ Soft with active bowel sounds No clubbing cyanosis  Edema Alert and oriented, grossly normal motor and sensory function Skin Warm and Dry  ECG demonstrates With some ventricular pacing right bundle branch block left axis deviation  -/15/40 Axis -78  Assessment and  Plan  Intermittent heart block  Paroxysmal atrial fibrillation detected on the device  Hypertension  Trifascicular block   Sinus node dysfunction  Atrial fibrillation  Pacemaker-[employer] The patient's device was interrogated.  The information was reviewed. No changes  were made in the programming.      The patient is now in persistent atrial fibrillation with paroxysms increasingly noteworthy over the last couple of months. He has been asymptomatic with this. A little bit fast. Hence, given his blood pressure issues discontinue his clonidine which is a low dose and begin metoprolol succinate 50 mg daily.  He will check into the costs of NOACs therapy and we will discontinue his aspirin and begin anticoagulation as soon as he lets [place] know which is the best drug for him. In anticipation of this we will check his renal function as well as 60%.  I am also  concerned as to the rapid escalation in his atrial fibrillation burden. We'll check a thyroid function test

## 2015-09-12 ENCOUNTER — Telehealth: Payer: Self-pay | Admitting: Internal Medicine

## 2015-09-12 NOTE — Telephone Encounter (Signed)
New message ° ° ° ° °Returning a call to the nurse °

## 2015-09-13 NOTE — Telephone Encounter (Signed)
I called and spoke with the patient. He states that he checked with the pharmacy on the cost of the NOAC's. He states that cost is all about the same for him for the 3 drugs. I advised I will review with Dr. Graciela HusbandsKlein and call him back to confirm drug/ dose. He is aware of the prelim report on his labs.

## 2015-09-14 NOTE — Telephone Encounter (Signed)
apixoban 5mg  bid thsnk

## 2015-09-15 MED ORDER — APIXABAN 5 MG PO TABS: 5.0000 mg | ORAL_TABLET | Freq: Two times a day (BID) | ORAL | Status: DC

## 2015-09-15 NOTE — Telephone Encounter (Signed)
I left a message for the patient to call. 

## 2015-09-15 NOTE — Telephone Encounter (Signed)
I spoke with the patient. He is aware Dr. Graciela HusbandsKlein has recommended Eliquis 5 mg one tablet by mouth BID. [facility]

## 2015-09-20 ENCOUNTER — Other Ambulatory Visit (INDEPENDENT_AMBULATORY_CARE_PROVIDER_SITE_OTHER): Payer: Medicare Other

## 2015-09-20 DIAGNOSIS — Z0000 Encounter for general adult medical examination without abnormal findings: Secondary | ICD-10-CM | POA: Diagnosis not present

## 2015-09-20 DIAGNOSIS — Z125 Encounter for screening for malignant neoplasm of prostate: Secondary | ICD-10-CM

## 2015-09-20 LAB — BASIC METABOLIC PANEL
BUN: 19 mg/dL (ref 6–23)
CALCIUM: 9.5 mg/dL (ref 8.4–10.5)
CHLORIDE: 103 meq/L (ref 96–112)
CO2: 30 mEq/L (ref 19–32)
CREATININE: 0.84 mg/dL (ref 0.40–1.50)
GFR: 91.72 mL/min (ref >60.00)
Glucose, Bld: 150 mg/dL — ABNORMAL HIGH (ref 70–99)
Potassium: 4.1 mEq/L (ref 3.5–5.1)
Sodium: 140 mEq/L (ref 135–145)

## 2015-09-20 LAB — CBC WITH DIFFERENTIAL/PLATELET
BASOS ABS: 0.1 10*3/uL (ref 0.0–0.1)
BASOS PCT: 0.8 % (ref 0.0–3.0)
EOS ABS: 0.3 10*3/uL (ref 0.0–0.7)
Eosinophils Relative: 2.9 % (ref 0.0–5.0)
HEMATOCRIT: 34.2 % — AB (ref 39.0–52.0)
HEMOGLOBIN: 11.1 g/dL — AB (ref 13.0–17.0)
LYMPHS PCT: 35.8 % (ref 12.0–46.0)
Lymphs Abs: 3.3 10*3/uL (ref 0.7–4.0)
MCHC: 32.5 g/dL (ref 30.0–36.0)
MCV: 90.3 fl (ref 78.0–100.0)
MONO ABS: 0.6 10*3/uL (ref 0.1–1.0)
Monocytes Relative: 6.8 % (ref 3.0–12.0)
Neutro Abs: 5 10*3/uL (ref 1.4–7.7)
Neutrophils Relative %: 53.7 % (ref 43.0–77.0)
Platelets: 174 10*3/uL (ref 150.0–400.0)
RBC: 3.79 Mil/uL — AB (ref 4.22–5.81)
RDW: 14.4 % (ref 11.5–15.5)
WBC: 9.3 10*3/uL (ref 4.0–10.5)

## 2015-09-20 LAB — HEPATIC FUNCTION PANEL
ALK PHOS: 35 U/L — AB (ref 39–117)
ALT: 8 U/L (ref 0–53)
AST: 10 U/L (ref 0–37)
Albumin: 3.9 g/dL (ref 3.5–5.2)
BILIRUBIN DIRECT: 0.1 mg/dL (ref 0.0–0.3)
BILIRUBIN TOTAL: 0.6 mg/dL (ref 0.2–1.2)
TOTAL PROTEIN: 6.4 g/dL (ref 6.0–8.3)

## 2015-09-20 LAB — POC URINALSYSI DIPSTICK (AUTOMATED)
Bilirubin, UA: NEGATIVE
Glucose, UA: NEGATIVE
KETONES UA: NEGATIVE
Leukocytes, UA: NEGATIVE
Nitrite, UA: NEGATIVE
RBC UA: NEGATIVE
SPEC GRAV UA: 1.02
UROBILINOGEN UA: 0.2
pH, UA: 5.5

## 2015-09-20 LAB — LIPID PANEL
CHOL/HDL RATIO: 3
Cholesterol: 81 mg/dL (ref 0–200)
HDL: 31.1 mg/dL — ABNORMAL LOW (ref >39.00)
LDL CALC: 33 mg/dL (ref 0–99)
NONHDL: 49.49
Triglycerides: 82 mg/dL (ref 0.0–149.0)
VLDL: 16.4 mg/dL (ref 0.0–40.0)

## 2015-09-20 LAB — MICROALBUMIN / CREATININE URINE RATIO
CREATININE, U: 198.7 mg/dL
MICROALB/CREAT RATIO: 4.9 mg/g (ref 0.0–30.0)
Microalb, Ur: 9.7 mg/dL — ABNORMAL HIGH (ref 0.0–1.9)

## 2015-09-20 LAB — HEMOGLOBIN A1C: Hgb A1c MFr Bld: 7.5 % — ABNORMAL HIGH (ref 4.6–6.5)

## 2015-09-20 LAB — TSH: TSH: 1.27 u[IU]/mL (ref 0.35–4.50)

## 2015-09-20 LAB — PSA: PSA: 14.96 ng/mL — AB (ref 0.10–4.00)

## 2015-09-25 ENCOUNTER — Encounter: Payer: Self-pay | Admitting: Internal Medicine

## 2015-09-25 ENCOUNTER — Ambulatory Visit (INDEPENDENT_AMBULATORY_CARE_PROVIDER_SITE_OTHER): Payer: Medicare Other | Admitting: Internal Medicine

## 2015-09-25 VITALS — BP 162/80 | HR 63 | Temp 97.7°F | Resp 20 | Ht 68.0 in | Wt 192.0 lb

## 2015-09-25 DIAGNOSIS — G4733 Obstructive sleep apnea (adult) (pediatric): Secondary | ICD-10-CM

## 2015-09-25 DIAGNOSIS — I10 Essential (primary) hypertension: Secondary | ICD-10-CM

## 2015-09-25 DIAGNOSIS — E0842 Diabetes mellitus due to underlying condition with diabetic polyneuropathy: Secondary | ICD-10-CM

## 2015-09-25 DIAGNOSIS — I480 Paroxysmal atrial fibrillation: Secondary | ICD-10-CM

## 2015-09-25 DIAGNOSIS — E785 Hyperlipidemia, unspecified: Secondary | ICD-10-CM

## 2015-09-25 NOTE — Patient Instructions (Addendum)
Limit your sodium (Salt) intake    It is important that you exercise regularly, at least 20 minutes 3 to 4 times per week.  If you develop chest pain or shortness of breath seek  medical attention.  Smoking tobacco is very bad for your health. You should stop smoking immediately.  Return in 6 months for follow-up  

## 2015-09-25 NOTE — Progress Notes (Signed)
Pre visit review using our clinic review tool, if applicable. No additional management support is needed unless otherwise documented below in the visit note. 

## 2015-09-25 NOTE — Progress Notes (Addendum)
   Subjective:    Patient ID: Kyle [name], male    DOB: 1926/04/04, 82 y.o.   MRN: [id number]  HPI    Review of Systems     Objective:   Physical Exam  HENT:  Edentulous  Cardiovascular:  Few ectopics Pedal pulses intact, except for an absent left dorsalis pedis pulse  Pulmonary/Chest: He has rales.          Assessment & Plan:

## 2015-09-25 NOTE — Progress Notes (Signed)
Subjective:    Patient ID: Kyle [name], male    DOB: Aug 16, 1925, 82 y.o.   MRN: [id number]  HPI   Wt Readings from Last 3 Encounters:  09/25/15 192 lb (87.091 kg)  09/11/15 192 lb 3.2 oz (87.181 kg)  03/22/15 192 lb (87.091 kg)   Subjective:    Patient ID: Kyle [name], male    DOB: Apr 19, 1926, 82 y.o.   MRN: [id number]  HPI History of Present Illness:   82   year-old patient who is seen today for a comprehensive evaluation.  Medical problems include trifascicular block.  The patient had a syncopal episode and is status post permanent pacemaker insertion 2014.  He is followed by cardiology.  He denies any exertional chest pain, shortness of breath.   He has treated dyslipidemia, hypertension, and type 2 diabetes. He is followed by urology for an elevated PSA and BPH. He is doing quite well.Marland Kitchen Unfortunately, he continues to smoke modestly.   He is also followed at the [facility] and recent PFTs have revealed mild obstructive defect.  He denies much in the way of exercise limitations. He states that he walks several times weekly.  No falls over the past year   Preventive Screening-Counseling & Management  Alcohol-Tobacco  Smoking Cessation Counseling: yes   Allergies:  No Known Drug Allergies   Past History:  Past Medical History:   Diabetes mellitus, type II  Hypertension  Hyperlipidemia  glaucoma  Benign prostatic hypertrophy  elevated PSA  tobacco use   Past Surgical History:   Tonsillectomy  sigmoidoscopy 2001  Negative Cardiolite stress test March 2006  Status post insertion pacemaker for syncope and trifascicular block 2014  Family History:   Family History of Prostate CA 1st degree relative <50  Family History Other cancer-Breast  Fam hx MI  father died of congestive heart failure with a history of prostate cancer  Mother died age 33 Cardiac dysrrythmia   Social History:   Current Smoker  Retired  Married   1. Risk factors, based on past   M,S,F history- vascular risk factors include tobacco use hypertension dyslipidemia and diabetes  2.  Physical activities:No major limitations still mows his lawn occasionally  3.  Depression/mood:No history depression or mood disorder;  denies any depressive symptoms   4.  Hearing: Mild deficits 5.  ADL's:Independent in all aspects of daily living  6.  Fall risk: Moderate to high, but no falls in the past year  7.  Home safety:No problems identified  8.  Height weight, and visual acuity;Height and weight stable no change in visual acuity.  Followed annually by ophthalmology  9.  Counseling:Tobacco cessation discussed and encouraged  10. Lab orders based on risk factors:Laboratory profile including hemoglobin A1c and PSA discussed  11. Referral : Followup urology as scheduled  12. Care plan: more regular exercise modest weight loss tobacco cessation all encouraged  13. Cognitive assessment: Alert and oriented with normal affect. No cognitive dysfunction.  14.  Preventive services will include annual preventive care.  Examinations.  He'll be seen by ophthalmology annually and followed by cardiology Patient was provided with a written and personalized care plan  15.  Provider list includes primary care cardiology, ophthalmology and urology.  [facility]  Past Medical History  Diagnosis Date  . Diabetes mellitus   . Hypertension   . Hyperlipidemia   . Glaucoma   . Elevated PSA   . Benign prostatic hypertrophy    Past  Surgical History  Procedure Date  . Tonsillectomy    Reports that he has been smoking Cigarettes.  He has been smoking about .5  packs per day. He has never used smokeless tobacco. He reports that he drinks alcohol. He reports that he does not use illicit drugs. family history includes Cancer in his father and Heart disease in his father and mother. No Known Allergies  Past Medical History  Diagnosis Date  . Diabetes mellitus    . Hypertension   . Hyperlipidemia   . Glaucoma   . Elevated PSA   . Benign prostatic hypertrophy   . Trifascicular block   . Intermittent complete heart block (HCC)   . Pacemaker   . PAF (paroxysmal atrial fibrillation) (HCC)     Detected on pacemaker; the duration 44 seconds    Social History   Social History  . Marital Status: Married    Spouse Name: N/A  . Number of Children: 4  . Years of Education: N/A   Occupational History  . Not on file.   Social History Main Topics  . Smoking status: Current Every Day Smoker -- 0.50 packs/day for 71 years    Types: Cigarettes  . Smokeless tobacco: Never Used  . Alcohol Use: 1.8 oz/week    1 Glasses of wine, 2 Shots of liquor per week  . Drug Use: No  . Sexual Activity: Not on file   Other Topics Concern  . Not on file   Social History Narrative   Lives at home with wife.      Past Surgical History  Procedure Laterality Date  . Tonsillectomy    . Cataract extraction, bilateral    . Permanent pacemaker insertion N/A 08/24/2012    Procedure: PERMANENT PACEMAKER INSERTION;  Surgeon: Duke SalviaSteven C Klein, MD;  [facility];  Service: Cardiovascular;  Laterality: N/A;    Family History  Problem Relation Age of Onset  . Heart disease Mother     atrial fib  . Heart disease Father 3091    MI  . Cancer Father     prostate    No Known Allergies  Current Outpatient Prescriptions on File Prior to Visit  Medication Sig Dispense Refill  . albuterol (PROVENTIL,VENTOLIN) 90 MCG/ACT inhaler Inhale 2 puffs into the lungs 2 (two) times daily.      Marland Kitchen. apixaban (ELIQUIS) 5 MG TABS tablet Take 1 tablet (5 mg total) by mouth 2 (two) times daily. 60 tablet 6  . doxazosin (CARDURA) 4 MG tablet TAKE 1 TABLET EACH DAY. 90 tablet 1  . furosemide (LASIX) 40 MG tablet TAKE 1 TABLET EACH DAY. 90 tablet 1  . glipiZIDE (GLUCOTROL) 5 MG tablet TAKE (1) TABLET TWICE A DAY BEFORE MEALS. 180 tablet 3  . glucose blood (PRECISION XTRA TEST STRIPS)  test strip 1 each by Other route daily as needed for other. Use as instructed 100 each 3  . latanoprost (XALATAN) 0.005 % ophthalmic solution 1 drop at bedtime.    Marland Kitchen. lisinopril (PRINIVIL,ZESTRIL) 40 MG tablet TAKE 1 TABLET ONCE DAILY. 90 tablet 3  . metFORMIN (GLUCOPHAGE) 1000 MG tablet TAKE 1 TABLET TWICE DAILY. 180 tablet 5  . metoprolol succinate (TOPROL-XL) 50 MG 24 hr tablet Take 1 tablet (50 mg total) by mouth daily. Take with or immediately following a meal. 30 tablet 6  . MOMETASONE FUROATE IN 220 mcg- inhale 2 puffs into the lungs once daily    . naproxen (NAPROSYN) 500 MG tablet TAKE (1) TABLET TWICE  A DAY AS NEEDED. (Patient taking differently: Take 500 mg by mouth 2 (two) times daily with a meal. TAKE (1) TABLET TWICE A DAY AS NEEDED.) 180 tablet 3  . simvastatin (ZOCOR) 20 MG tablet TAKE ONE TABLET AT BEDTIME. 90 tablet 3   No current facility-administered medications on file prior to visit.    BP 162/80 mmHg  Pulse 63  Temp(Src) 97.7 F (36.5 C) (Oral)  Resp 20  Ht 5\' 8"  (1.727 m)  Wt 192 lb (87.091 kg)  BMI 29.20 kg/m2  SpO2 97%         Review of Systems  Constitutional: Negative for fever, chills, activity change, appetite change and fatigue.  HENT: Negative for hearing loss, ear pain, congestion, rhinorrhea, sneezing, mouth sores, trouble swallowing, neck pain, neck stiffness, dental problem, voice change, sinus pressure and tinnitus.   Eyes: Negative for photophobia, pain, redness and visual disturbance.  Respiratory: Negative for apnea, cough, choking, chest tightness, shortness of breath and wheezing.   Cardiovascular: Negative for chest pain, palpitations and leg swelling.  Gastrointestinal: Negative for nausea, vomiting, abdominal pain, diarrhea, constipation, blood in stool, abdominal distention, anal bleeding and rectal pain.  Genitourinary: Negative for dysuria, urgency, frequency, hematuria, flank pain, decreased urine volume, discharge, penile swelling,  scrotal swelling, difficulty urinating, genital sores and testicular pain.  Musculoskeletal: Negative for myalgias, back pain, joint swelling, arthralgias and gait problem.  Skin: Negative for color change, rash and wound.  Neurological: Negative for dizziness, tremors, seizures, syncope, facial asymmetry, speech difficulty, weakness, light-headedness, numbness and headaches.  Hematological: Negative for adenopathy. Does not bruise/bleed easily.  Psychiatric/Behavioral: Negative for suicidal ideas, hallucinations, behavioral problems, confusion, sleep disturbance, self-injury, dysphoric mood, decreased concentration and agitation. The patient is not nervous/anxious.        Objective:   Physical Exam  Constitutional: He appears well-developed and well-nourished.  HENT:  Head: Normocephalic and atraumatic.  Right Ear: External ear normal.  Left Ear: External ear normal.  Nose: Nose normal.  Mouth/Throat: Oropharynx is clear and moist.  Eyes: Conjunctivae and EOM are normal. Pupils are equal, round, and reactive to light. No scleral icterus.  Neck: Normal range of motion. Neck supple. No JVD present. No thyromegaly present.  Cardiovascular: irregular rhythm, normal heart sounds and intact distal pulses.  Exam reveals no gallop and no friction rub.  No murmur heard.      Left supraclavicular and  bilateral femoral bruits Trace edema;  dorsalis pedis pulses faint.  Posterior tibial pulses full Pulmonary/Chest: Effort normal and breath sounds normal. He exhibits no tenderness.  Abdominal: Soft. Bowel sounds are normal. He exhibits no distension and no mass. There is no tenderness.  Genitourinary: Penis normal.  Musculoskeletal: Normal range of motion. He exhibits no edema and no tenderness.  Lymphadenopathy:    He has no cervical adenopathy.  Neurological: He is alert. He has normal reflexes. No cranial nerve deficit. Coordination normal.  Skin: Skin is warm and dry. No rash noted.   Psychiatric: He has a normal mood and affect. His behavior is normal.          Assessment & Plan:  Preventive health examination Hypertension stable BPH with increased PSA.  Follow-up urology  Dyslipidemia.  Continue low intensity statin therapy  Type 2 diabetes.  Hemoglobin A1c acceptable.  Continue present therapy  Tobacco use.  Total smoking cessation encouraged   Total cessation tobacco encouraged. Modest weight loss and a more regular exercise regimen also recommended;  return in 3 months for followup  Review of Systems   as above    Objective:   Physical Exam    As above        Assessment & Plan:      As above

## 2015-09-27 ENCOUNTER — Telehealth: Payer: Self-pay | Admitting: Internal Medicine

## 2015-09-27 NOTE — Telephone Encounter (Signed)
Prior auth for Eliquis 5 mg submitted to [facility]. 

## 2015-09-27 NOTE — Telephone Encounter (Signed)
New message      Need prior approval for eliquis to be sent in to [facility]

## 2015-09-29 ENCOUNTER — Telehealth: Payer: Self-pay

## 2015-09-29 NOTE — Telephone Encounter (Signed)
Eliquis approved through 06/16/2016. [id number].

## 2015-10-11 DIAGNOSIS — R972 Elevated prostate specific antigen [PSA]: Secondary | ICD-10-CM | POA: Diagnosis not present

## 2015-10-11 DIAGNOSIS — Z0000 Encounter for general adult medical examination without abnormal findings: Secondary | ICD-10-CM | POA: Diagnosis not present

## 2015-10-11 DIAGNOSIS — N401 Enlarged prostate with lower urinary tract symptoms: Secondary | ICD-10-CM | POA: Diagnosis not present

## 2015-10-11 DIAGNOSIS — R3916 Straining to void: Secondary | ICD-10-CM | POA: Diagnosis not present

## 2015-10-16 ENCOUNTER — Telehealth: Payer: Self-pay | Admitting: Internal Medicine

## 2015-10-16 NOTE — Telephone Encounter (Signed)
New message    Pt daughter Misty Stanleylisa is calling   Pt c/o medication issue:  1. Name of Medication: ELIQUIS  2. How are you currently taking this medication (dosage and times per day)? UNKNOWN MG  2X DAY  3. Are you having a reaction (difficulty breathing--STAT)? NO  4. What is your medication issue? General last of motivation, hyper, frequent urination and loose bm

## 2015-10-16 NOTE — Telephone Encounter (Signed)
Left message for Misty StanleyLisa that she is not on the pt's DPR.  Advised that the pt will need to contact our office to be able to discuss medical concerns further.

## 2015-10-16 NOTE — Telephone Encounter (Signed)
The pt called into the office and gave permission for us to speak with his daughter Fredirick MaudlinLisa Cooper.  I spoke with the pt about his symptoms.  The pt describes a lack of motivation and just not caring at this point. I asked the pt if he has had issues with depression or any recent life changing events and he denies history of depression.  I made the pt aware of upcoming appointment with Atrial Fibrillation clinic on 10/23/15. I also spoke with Misty StanleyLisa and she and her siblings also feel like the pt is depressed.  She plans to contact the PCP to follow-up on concerns with the pt.  Also Misty [facility]Lisa [facility].

## 2015-10-17 ENCOUNTER — Telehealth: Payer: Self-pay | Admitting: Internal Medicine

## 2015-10-17 NOTE — Telephone Encounter (Signed)
Pt had appointment tomorrow scheduled by the daughter that has to be rescheduled.  Daughter states pt may be showing signs of depression. Pt is lathargic, no interest, and family is concerned. Pt had CPE on 4/10 and daughter wants to know if you had noticed any signs that pt may have changed.  Daughter states she is handling most of pt's affairs at this point, however [facility]. Only [facility] for her to discuss pt.  She would like to know if pt should be seen on Thursday, or something that could wait until next week. Wed was a better day for pt's appointment, and Thursday will be difficult to get her for daughter, who will be coming with pt.

## 2015-10-17 NOTE — Telephone Encounter (Signed)
Olegario MessierKathy, I do not recall pt being out of the ordinary and Dr.K did not mention anything in his note that pt was anxious or anything. It is up to the daughter if she can not make Thurs this week then do Wed next week. Thanks.

## 2015-10-17 NOTE — Telephone Encounter (Signed)
Noted  

## 2015-10-17 NOTE — Telephone Encounter (Signed)
Daughter states they will keep appointment on Thursday. Also reminded daughter to sign DPR while at office visit.

## 2015-10-18 ENCOUNTER — Ambulatory Visit: Payer: Medicare Other | Admitting: Internal Medicine

## 2015-10-19 ENCOUNTER — Ambulatory Visit (INDEPENDENT_AMBULATORY_CARE_PROVIDER_SITE_OTHER): Payer: Medicare Other | Admitting: Internal Medicine

## 2015-10-19 ENCOUNTER — Encounter: Payer: Self-pay | Admitting: Internal Medicine

## 2015-10-19 VITALS — BP 170/80 | HR 63 | Temp 98.0°F | Resp 20 | Ht 68.0 in | Wt 184.0 lb

## 2015-10-19 DIAGNOSIS — I10 Essential (primary) hypertension: Secondary | ICD-10-CM | POA: Diagnosis not present

## 2015-10-19 DIAGNOSIS — I480 Paroxysmal atrial fibrillation: Secondary | ICD-10-CM

## 2015-10-19 DIAGNOSIS — E0842 Diabetes mellitus due to underlying condition with diabetic polyneuropathy: Secondary | ICD-10-CM

## 2015-10-19 DIAGNOSIS — E785 Hyperlipidemia, unspecified: Secondary | ICD-10-CM | POA: Diagnosis not present

## 2015-10-19 DIAGNOSIS — F32A Depression, unspecified: Secondary | ICD-10-CM | POA: Insufficient documentation

## 2015-10-19 DIAGNOSIS — F329 Major depressive disorder, single episode, unspecified: Secondary | ICD-10-CM

## 2015-10-19 MED ORDER — FUROSEMIDE 20 MG PO TABS: 20.0000 mg | ORAL_TABLET | Freq: Every day | ORAL | Status: DC

## 2015-10-19 MED ORDER — APIXABAN 2.5 MG PO TABS: 2.5000 mg | ORAL_TABLET | Freq: Two times a day (BID) | ORAL | Status: DC

## 2015-10-19 MED ORDER — ESCITALOPRAM OXALATE 5 MG PO TABS: 5.0000 mg | ORAL_TABLET | Freq: Every day | ORAL | Status: DC

## 2015-10-19 NOTE — Progress Notes (Signed)
Subjective:    Patient ID: Kyle [name], male    DOB: 1925-07-02, 82 y.o.   MRN: [id number]  HPI  82 year old patient who has a history of paroxysmal atrial fibrillation.  He has been treated with Eliquis 5 mg twice a day and he states that he has felt poorly since initiating this medication.  He is accompanied by a daughter today who describes increase in depression, irritability, and general sense of unwellness.  He also describes frequent urination, nocturia and some diarrhea.  He does have a history of BPH and elevated PSA and is followed by urology. He was given samples of Eliquis and he states that the medication.  He has taken at home is a different tablet.  He will check with pharmacy to confirm he is in fact taking Eliquis. He takes care of a disabled wife and has much situational stress due to his being a primary caregiver  Past Medical History  Diagnosis Date  . Diabetes mellitus   . Hypertension   . Hyperlipidemia   . Glaucoma   . Elevated PSA   . Benign prostatic hypertrophy   . Trifascicular block   . Intermittent complete heart block (HCC)   . Pacemaker   . PAF (paroxysmal atrial fibrillation) (HCC)     Detected on pacemaker; the duration 44 seconds     Social History   Social History  . Marital Status: Married    Spouse Name: N/A  . Number of Children: 4  . Years of Education: N/A   Occupational History  . Not on file.   Social History Main Topics  . Smoking status: Current Every Day Smoker -- 0.50 packs/day for 71 years    Types: Cigarettes  . Smokeless tobacco: Never Used  . Alcohol Use: 1.8 oz/week    1 Glasses of wine, 2 Shots of liquor per week  . Drug Use: No  . Sexual Activity: Not on file   Other Topics Concern  . Not on file   Social History Narrative   Lives at home with wife.      Past Surgical History  Procedure Laterality Date  . Tonsillectomy    . Cataract extraction, bilateral    . Permanent pacemaker insertion N/A 08/24/2012     Procedure: PERMANENT PACEMAKER INSERTION;  Surgeon: Duke Salvia, MD;  Location: [facility] CATH LAB;  Service: Cardiovascular;  Laterality: N/A;    Family History  Problem Relation Age of Onset  . Heart disease Mother     atrial fib  . Heart disease Father 13    MI  . Cancer Father     prostate    No Known Allergies  Current Outpatient Prescriptions on File Prior to Visit  Medication Sig Dispense Refill  . albuterol (PROVENTIL,VENTOLIN) 90 MCG/ACT inhaler Inhale 2 puffs into the lungs 2 (two) times daily.      Marland Kitchen doxazosin (CARDURA) 4 MG tablet TAKE 1 TABLET EACH DAY. 90 tablet 1  . glipiZIDE (GLUCOTROL) 5 MG tablet TAKE (1) TABLET TWICE A DAY BEFORE MEALS. 180 tablet 3  . glucose blood (PRECISION XTRA TEST STRIPS) test strip 1 each by Other route daily as needed for other. Use as instructed 100 each 3  . latanoprost (XALATAN) 0.005 % ophthalmic solution 1 drop at bedtime.    Marland Kitchen lisinopril (PRINIVIL,ZESTRIL) 40 MG tablet TAKE 1 TABLET ONCE DAILY. 90 tablet 3  . metFORMIN (GLUCOPHAGE) 1000 MG tablet TAKE 1 TABLET TWICE DAILY. 180 tablet 5  . metoprolol succinate (  TOPROL-XL) 50 MG 24 hr tablet Take 1 tablet (50 mg total) by mouth daily. Take with or immediately following a meal. 30 tablet 6  . MOMETASONE FUROATE IN 220 mcg- inhale 2 puffs into the lungs once daily    . simvastatin (ZOCOR) 20 MG tablet TAKE ONE TABLET AT BEDTIME. 90 tablet 3   No current facility-administered medications on file prior to visit.    BP 170/80 mmHg  Pulse 63  Temp(Src) 98 F (36.7 C) (Oral)  Resp 20  Ht 5\' 8"  (1.727 m)  Wt 184 lb (83.462 kg)  BMI 27.98 kg/m2  SpO2 97%     Review of Systems  Constitutional: Negative for fever, chills, appetite change and fatigue.  HENT: Negative for congestion, dental problem, ear pain, hearing loss, sore throat, tinnitus, trouble swallowing and voice change.   Eyes: Negative for pain, discharge and visual disturbance.  Respiratory: Negative for cough, chest  tightness, wheezing and stridor.   Cardiovascular: Negative for chest pain, palpitations and leg swelling.  Gastrointestinal: Positive for diarrhea. Negative for nausea, vomiting, abdominal pain, constipation, blood in stool and abdominal distention.  Endocrine: Positive for polyuria.  Genitourinary: Positive for frequency. Negative for urgency, hematuria, flank pain, discharge, difficulty urinating and genital sores.  Musculoskeletal: Negative for myalgias, back pain, joint swelling, arthralgias, gait problem and neck stiffness.  Skin: Negative for rash.  Neurological: Negative for dizziness, syncope, speech difficulty, weakness, numbness and headaches.  Hematological: Negative for adenopathy. Does not bruise/bleed easily.  Psychiatric/Behavioral: Positive for dysphoric mood. Negative for behavioral problems. The patient is nervous/anxious.        Objective:   Physical Exam  Constitutional: He is oriented to person, place, and time. He appears well-developed.  Blood pressure 160/76  HENT:  Head: Normocephalic.  Right Ear: External ear normal.  Left Ear: External ear normal.  Eyes: Conjunctivae and EOM are normal.  Neck: Normal range of motion.  Cardiovascular: Normal rate and normal heart sounds.   Pulmonary/Chest: He has rales.  Abdominal: Bowel sounds are normal.  Musculoskeletal: Normal range of motion. He exhibits no edema or tenderness.  Neurological: He is alert and oriented to person, place, and time.  Psychiatric: He has a normal mood and affect. His behavior is normal.          Assessment & Plan:   Situational stress/depression.  We'll start Lexapro 5 Chronic anticoagulation.  Will decrease Eliquis to 2.5 milligrams twice daily.  Patient will take his present Eliquis to his drug store and confirm this is in fact Eliquis Essential hypertension Diabetes mellitus type 2 Polyuria/BPH will moderate alcohol, caffeine and fluid intake.  Decrease furosemide to 20 mg daily.   Patient report any ankle edema, weight gain or shortness of breath  Recheck 2 months.  We'll follow-up hemoglobin A1c and general status

## 2015-10-19 NOTE — Patient Instructions (Signed)
Return in 2 months for follow-up  Decrease Eliquis to 2.5 milligrams daily  Report any weight gain  Limit your sodium (Salt) intake

## 2015-10-19 NOTE — Progress Notes (Signed)
Pre visit review using our clinic review tool, if applicable. No additional management support is needed unless otherwise documented below in the visit note. 

## 2015-10-23 ENCOUNTER — Ambulatory Visit (HOSPITAL_COMMUNITY): Payer: Medicare Other | Admitting: Nurse Practitioner

## 2015-11-02 ENCOUNTER — Encounter (HOSPITAL_COMMUNITY): Payer: Self-pay | Admitting: Nurse Practitioner

## 2015-11-02 ENCOUNTER — Ambulatory Visit (HOSPITAL_COMMUNITY)
Admission: RE | Admit: 2015-11-02 | Discharge: 2015-11-02 | Disposition: A | Discharge status: Home / Self Care | Payer: Medicare Other | Source: Ambulatory Visit | Attending: Nurse Practitioner | Admitting: Nurse Practitioner

## 2015-11-02 VITALS — BP 130/72 | HR 62 | Ht 68.0 in | Wt 185.6 lb

## 2015-11-02 DIAGNOSIS — Z0189 Encounter for other specified special examinations: Secondary | ICD-10-CM | POA: Diagnosis not present

## 2015-11-02 DIAGNOSIS — F17210 Nicotine dependence, cigarettes, uncomplicated: Secondary | ICD-10-CM | POA: Insufficient documentation

## 2015-11-02 DIAGNOSIS — I10 Essential (primary) hypertension: Secondary | ICD-10-CM | POA: Diagnosis not present

## 2015-11-02 DIAGNOSIS — I481 Persistent atrial fibrillation: Secondary | ICD-10-CM | POA: Insufficient documentation

## 2015-11-02 DIAGNOSIS — I442 Atrioventricular block, complete: Secondary | ICD-10-CM | POA: Diagnosis not present

## 2015-11-02 DIAGNOSIS — E785 Hyperlipidemia, unspecified: Secondary | ICD-10-CM | POA: Diagnosis not present

## 2015-11-02 DIAGNOSIS — R972 Elevated prostate specific antigen [PSA]: Secondary | ICD-10-CM | POA: Insufficient documentation

## 2015-11-02 DIAGNOSIS — Z950 Presence of cardiac pacemaker: Secondary | ICD-10-CM | POA: Insufficient documentation

## 2015-11-02 DIAGNOSIS — E119 Type 2 diabetes mellitus without complications: Secondary | ICD-10-CM | POA: Diagnosis not present

## 2015-11-02 DIAGNOSIS — I480 Paroxysmal atrial fibrillation: Secondary | ICD-10-CM | POA: Insufficient documentation

## 2015-11-02 DIAGNOSIS — I4819 Other persistent atrial fibrillation: Secondary | ICD-10-CM

## 2015-11-02 DIAGNOSIS — H409 Unspecified glaucoma: Secondary | ICD-10-CM | POA: Insufficient documentation

## 2015-11-02 DIAGNOSIS — Z79899 Other long term (current) drug therapy: Secondary | ICD-10-CM | POA: Insufficient documentation

## 2015-11-02 DIAGNOSIS — Z7901 Long term (current) use of anticoagulants: Secondary | ICD-10-CM | POA: Insufficient documentation

## 2015-11-02 DIAGNOSIS — Z7984 Long term (current) use of oral hypoglycemic drugs: Secondary | ICD-10-CM | POA: Insufficient documentation

## 2015-11-02 LAB — COMPREHENSIVE METABOLIC PANEL
ALBUMIN: 4 g/dL (ref 3.5–5.0)
ALT: 13 U/L — AB (ref 17–63)
ANION GAP: 10 (ref 5–15)
AST: 15 U/L (ref 15–41)
Alkaline Phosphatase: 41 U/L (ref 38–126)
BILIRUBIN TOTAL: 0.9 mg/dL (ref 0.3–1.2)
BUN: 15 mg/dL (ref 6–20)
CHLORIDE: 101 mmol/L (ref 101–111)
CO2: 28 mmol/L (ref 22–32)
Calcium: 9.7 mg/dL (ref 8.9–10.3)
Creatinine, Ser: 0.78 mg/dL (ref 0.61–1.24)
GFR calc Af Amer: >60 mL/min (ref >60)
GFR calc non Af Amer: >60 mL/min (ref >60)
GLUCOSE: 88 mg/dL (ref 65–99)
POTASSIUM: 4.3 mmol/L (ref 3.5–5.1)
Sodium: 139 mmol/L (ref 135–145)
TOTAL PROTEIN: 6.9 g/dL (ref 6.5–8.1)

## 2015-11-02 LAB — CBC
HEMATOCRIT: 37.3 % — AB (ref 39.0–52.0)
Hemoglobin: 11.8 g/dL — ABNORMAL LOW (ref 13.0–17.0)
MCH: 28.4 pg (ref 26.0–34.0)
MCHC: 31.6 g/dL (ref 30.0–36.0)
MCV: 89.9 fL (ref 78.0–100.0)
Platelets: 190 10*3/uL (ref 150–400)
RBC: 4.15 MIL/uL — AB (ref 4.22–5.81)
RDW: 13.9 % (ref 11.5–15.5)
WBC: 9.4 10*3/uL (ref 4.0–10.5)

## 2015-11-02 MED ORDER — APIXABAN 2.5 MG PO TABS: 2.5000 mg | ORAL_TABLET | Freq: Two times a day (BID) | ORAL | Status: DC

## 2015-11-02 MED ORDER — METOPROLOL SUCCINATE ER 50 MG PO TB24: ORAL_TABLET | ORAL | Status: AC

## 2015-11-02 NOTE — Patient Instructions (Signed)
Your physician has recommended you make the following change in your medication:  1)Decrease metoprolol to 1/2 tablet once a day (25mg )

## 2015-11-02 NOTE — Progress Notes (Signed)
Patient ID: Kyle CairoRobert P Drenning, male   DOB: 1925/09/29, 82 y.o.   MRN: [id number]     Primary Care Physician: Rogelia BogaKWIATKOWSKI,PETER FRANK, MD Referring Physician: Dr. Georgia LopesKlein   Kyle [name] is a 82 y.o. male, PPM, 3/14, with a h/o persistent afib that increased afib burden was noticed by Dr. Graciela HusbandsKlein on office visit 3/27, with interrogation of device. He is accompanied by daughter. He had been encouraged to start anticoagulant in the past but he declined. He did consent to start eliquis which he did so at 5 mg bid mid April. He was also started on metoprolol for rate control 50 mg at hs. He was seen in f/u with pcp and described some mood changes and eliquis was cut in half to 2.5 mg bid.  He is telling me today since staring the two new drugs that he has been having around 4 watery stools a day that is the color of " French's yellow mustard." He really does not feel his heart rate. No bleeding issues, no falls.  Today, he denies symptoms of palpitations, chest pain, shortness of breath, orthopnea, PND, lower extremity edema, dizziness, presyncope, syncope, or neurologic sequela.Positive for diarrhea. The patient is tolerating medications without difficulties and is otherwise without complaint today.   Past Medical History  Diagnosis Date  . Diabetes mellitus   . Hypertension   . Hyperlipidemia   . Glaucoma   . Elevated PSA   . Benign prostatic hypertrophy   . Trifascicular block   . Intermittent complete heart block (HCC)   . Pacemaker   . PAF (paroxysmal atrial fibrillation) (HCC)     Detected on pacemaker; the duration 44 seconds   Past Surgical History  Procedure Laterality Date  . Tonsillectomy    . Cataract extraction, bilateral    . Permanent pacemaker insertion N/A 08/24/2012    Procedure: PERMANENT PACEMAKER INSERTION;  Surgeon: Duke SalviaSteven C Klein, MD;  [facility];  Service: Cardiovascular;  Laterality: N/A;    Current Outpatient Prescriptions  Medication Sig Dispense Refill   . albuterol (PROVENTIL,VENTOLIN) 90 MCG/ACT inhaler Inhale 2 puffs into the lungs 2 (two) times daily.      Marland Kitchen. apixaban (ELIQUIS) 2.5 MG TABS tablet Take 1 tablet (2.5 mg total) by mouth 2 (two) times daily. 180 tablet 2  . doxazosin (CARDURA) 4 MG tablet TAKE 1 TABLET EACH DAY. 90 tablet 1  . escitalopram (LEXAPRO) 5 MG tablet Take 1 tablet (5 mg total) by mouth daily. 90 tablet 2  . furosemide (LASIX) 20 MG tablet Take 1 tablet (20 mg total) by mouth daily. 90 tablet 4  . glipiZIDE (GLUCOTROL) 5 MG tablet TAKE (1) TABLET TWICE A DAY BEFORE MEALS. 180 tablet 3  . glucose blood (PRECISION XTRA TEST STRIPS) test strip 1 each by Other route daily as needed for other. Use as instructed 100 each 3  . latanoprost (XALATAN) 0.005 % ophthalmic solution 1 drop at bedtime.    Marland Kitchen. lisinopril (PRINIVIL,ZESTRIL) 40 MG tablet TAKE 1 TABLET ONCE DAILY. 90 tablet 3  . metFORMIN (GLUCOPHAGE) 1000 MG tablet TAKE 1 TABLET TWICE DAILY. 180 tablet 5  . metoprolol succinate (TOPROL-XL) 50 MG 24 hr tablet Take 1/2 tablet by mouth daily (25mg ) 30 tablet 6  . MOMETASONE FUROATE IN 220 mcg- inhale 2 puffs into the lungs once daily    . simvastatin (ZOCOR) 20 MG tablet TAKE ONE TABLET AT BEDTIME. 90 tablet 3   No current facility-administered medications for this encounter.  No Known Allergies  Social History   Social History  . Marital Status: Married    Spouse Name: N/A  . Number of Children: 4  . Years of Education: N/A   Occupational History  . Not on file.   Social History Main Topics  . Smoking status: Current Every Day Smoker -- 0.50 packs/day for 71 years    Types: Cigarettes  . Smokeless tobacco: Never Used  . Alcohol Use: 1.8 oz/week    1 Glasses of wine, 2 Shots of liquor per week  . Drug Use: No  . Sexual Activity: Not on file   Other Topics Concern  . Not on file   Social History Narrative   Lives at home with wife.      Family History  Problem Relation Age of Onset  . Heart  disease Mother     atrial fib  . Heart disease Father 10    MI  . Cancer Father     prostate    ROS- All systems are reviewed and negative except as per the HPI above  Physical Exam: Filed Vitals:   11/02/15 1042  BP: 130/72  Pulse: 62  Height:  (1.727 m)  Weight: 185 lb 9.6 oz (84.188 kg)    GEN- The patient is well appearing, alert and oriented x 3 today.   Head- normocephalic, atraumatic Eyes-  Sclera clear, conjunctiva pink Ears- hearing intact Oropharynx- clear Neck- supple, no JVP Lymph- no cervical lymphadenopathy Lungs- Clear to ausculation bilaterally, normal work of breathing Heart- Regular rate and rhythm, no murmurs, rubs or gallops, PMI not laterally displaced GI- soft, NT, ND, + BS Extremities- no clubbing, cyanosis, or edema MS- no significant deformity or atrophy Skin- no rash or lesion Psych- euthymic mood, full affect Neuro- strength and sensation are intact  EKG- AV dual paced rhythm with prolonged AV conduction. Pr int 292 ms, qrs 170 ms, qtc 450 ms Epic records reviewed Device not interrogated today  Assessment and Plan: 1. afib Continue metoprolol but  reduce to 25 mg a day in case it is contributing to diarrhea Continue eliquis at 2.5 mg bid Will interrogate device on next visit with reduction in BB  2. Yellow/ watery stool ? Etiology Cmet,CBC today Will make PCP aware if any further work up is needed  F/u in 3 weeks with interrogation  Lupita Leash C. Matthew Folks [facility] [address] [place], [place] [address] [phone]

## 2015-11-15 ENCOUNTER — Other Ambulatory Visit: Payer: Self-pay | Admitting: Internal Medicine

## 2015-11-23 ENCOUNTER — Ambulatory Visit (HOSPITAL_COMMUNITY)
Admission: RE | Admit: 2015-11-23 | Discharge: 2015-11-23 | Disposition: A | Discharge status: Home / Self Care | Payer: Medicare Other | Source: Ambulatory Visit | Attending: Nurse Practitioner | Admitting: Nurse Practitioner

## 2015-11-23 ENCOUNTER — Encounter (HOSPITAL_COMMUNITY): Payer: Self-pay | Admitting: Nurse Practitioner

## 2015-11-23 VITALS — BP 146/80 | HR 64 | Ht 68.0 in | Wt 186.2 lb

## 2015-11-23 DIAGNOSIS — I442 Atrioventricular block, complete: Secondary | ICD-10-CM | POA: Diagnosis not present

## 2015-11-23 DIAGNOSIS — H409 Unspecified glaucoma: Secondary | ICD-10-CM | POA: Diagnosis not present

## 2015-11-23 DIAGNOSIS — Z79899 Other long term (current) drug therapy: Secondary | ICD-10-CM | POA: Insufficient documentation

## 2015-11-23 DIAGNOSIS — E785 Hyperlipidemia, unspecified: Secondary | ICD-10-CM | POA: Insufficient documentation

## 2015-11-23 DIAGNOSIS — Z7984 Long term (current) use of oral hypoglycemic drugs: Secondary | ICD-10-CM | POA: Insufficient documentation

## 2015-11-23 DIAGNOSIS — Z8249 Family history of ischemic heart disease and other diseases of the circulatory system: Secondary | ICD-10-CM | POA: Diagnosis not present

## 2015-11-23 DIAGNOSIS — Z7901 Long term (current) use of anticoagulants: Secondary | ICD-10-CM | POA: Diagnosis not present

## 2015-11-23 DIAGNOSIS — F17210 Nicotine dependence, cigarettes, uncomplicated: Secondary | ICD-10-CM | POA: Insufficient documentation

## 2015-11-23 DIAGNOSIS — E119 Type 2 diabetes mellitus without complications: Secondary | ICD-10-CM | POA: Insufficient documentation

## 2015-11-23 DIAGNOSIS — Z950 Presence of cardiac pacemaker: Secondary | ICD-10-CM | POA: Diagnosis not present

## 2015-11-23 DIAGNOSIS — I480 Paroxysmal atrial fibrillation: Secondary | ICD-10-CM | POA: Diagnosis not present

## 2015-11-23 DIAGNOSIS — I10 Essential (primary) hypertension: Secondary | ICD-10-CM | POA: Diagnosis not present

## 2015-11-23 MED ORDER — APIXABAN 2.5 MG PO TABS: 2.5000 mg | ORAL_TABLET | Freq: Two times a day (BID) | ORAL | Status: AC

## 2015-11-23 NOTE — Progress Notes (Signed)
Patient ID: Kyle [name], male   DOB: 1926/04/02, 82 y.o.   MRN: [id number]     Primary Care Physician: Rogelia Boga, MD Referring Physician: Dr. Georgia Lopes P Kyle [name] is a 82 y.o. male, PPM, 3/14, with a h/o persistent afib that increased afib burden was noticed by Dr. Graciela Husbands on office visit 3/27, with interrogation of device. He is accompanied by daughter. He had been encouraged to start anticoagulant in the past but he declined. He did consent to start eliquis which he did so at 5 mg bid mid April. He was also started on metoprolol for rate control 50 mg at hs. He was seen in f/u with pcp and described some mood changes and eliquis was cut in half to 2.5 mg bid.  He is telling me today since staring the two new drugs that he has been having around 4 watery stools a day that is the color of " French's yellow mustard." He really does not feel his heart rate. No bleeding issues, no falls.  Returns 6/8 and denies any symptom of afib, reducing metoprolol has reduced watery stools with urgency but states stool color is still yellow. No bleeding issues with 2.5 mg eliquis. No complaints today. Interrogation of device shows no significant increase of afib burden with reduction of metoprolol.In SR today.  Today, he denies symptoms of palpitations, chest pain, shortness of breath, orthopnea, PND, lower extremity edema, dizziness, presyncope, syncope, or neurologic sequela. Positive for loose yellow stools but improvement of watery stools with urgency. No abdominal pain. The patient is tolerating medications without difficulties and is otherwise without complaint today.   Past Medical History  Diagnosis Date  . Diabetes mellitus   . Hypertension   . Hyperlipidemia   . Glaucoma   . Elevated PSA   . Benign prostatic hypertrophy   . Trifascicular block   . Intermittent complete heart block (HCC)   . Pacemaker   . PAF (paroxysmal atrial fibrillation) (HCC)     Detected on pacemaker; the  duration 44 seconds   Past Surgical History  Procedure Laterality Date  . Tonsillectomy    . Cataract extraction, bilateral    . Permanent pacemaker insertion N/A 08/24/2012    Procedure: PERMANENT PACEMAKER INSERTION;  Surgeon: Duke Salvia, MD;  Location: [facility] CATH LAB;  Service: Cardiovascular;  Laterality: N/A;    Current Outpatient Prescriptions  Medication Sig Dispense Refill  . albuterol (PROVENTIL,VENTOLIN) 90 MCG/ACT inhaler Inhale 2 puffs into the lungs 2 (two) times daily.      Marland Kitchen apixaban (ELIQUIS) 2.5 MG TABS tablet Take 1 tablet (2.5 mg total) by mouth 2 (two) times daily. 60 tablet 1  . doxazosin (CARDURA) 4 MG tablet TAKE 1 TABLET EACH DAY. 90 tablet 1  . escitalopram (LEXAPRO) 5 MG tablet Take 1 tablet (5 mg total) by mouth daily. 90 tablet 2  . furosemide (LASIX) 20 MG tablet Take 1 tablet (20 mg total) by mouth daily. (Patient taking differently: Take 10 mg by mouth daily. ) 90 tablet 4  . glipiZIDE (GLUCOTROL) 5 MG tablet TAKE (1) TABLET TWICE A DAY BEFORE MEALS. 180 tablet 1  . glucose blood (PRECISION XTRA TEST STRIPS) test strip 1 each by Other route daily as needed for other. Use as instructed 100 each 3  . latanoprost (XALATAN) 0.005 % ophthalmic solution 1 drop at bedtime.    Marland Kitchen lisinopril (PRINIVIL,ZESTRIL) 40 MG tablet TAKE 1 TABLET ONCE DAILY. 90 tablet 3  . metFORMIN (GLUCOPHAGE) 1000 MG  tablet TAKE 1 TABLET TWICE DAILY. 180 tablet 1  . metoprolol succinate (TOPROL-XL) 50 MG 24 hr tablet Take 1/2 tablet by mouth daily (25mg ) 30 tablet 6  . MOMETASONE FUROATE IN 220 mcg- inhale 2 puffs into the lungs once daily    . simvastatin (ZOCOR) 20 MG tablet TAKE ONE TABLET AT BEDTIME. 90 tablet 3   No current facility-administered medications for this encounter.    No Known Allergies  Social History   Social History  . Marital Status: Married    Spouse Name: N/A  . Number of Children: 4  . Years of Education: N/A   Occupational History  . Not on file.    Social History Main Topics  . Smoking status: Current Every Day Smoker -- 0.50 packs/day for 71 years    Types: Cigarettes  . Smokeless tobacco: Never Used  . Alcohol Use: 1.8 oz/week    1 Glasses of wine, 2 Shots of liquor per week  . Drug Use: No  . Sexual Activity: Not on file   Other Topics Concern  . Not on file   Social History Narrative   Lives at home with wife.      Family History  Problem Relation Age of Onset  . Heart disease Mother     atrial fib  . Heart disease Father [id number]    MI  . Cancer Father     prostate    ROS- All systems are reviewed and negative except as per the HPI above  Physical Exam: Filed Vitals:   11/23/15 1104 11/23/15 1128  BP: 172/80 146/80  Pulse: 64   Height: 5\' 8"  (1.727 m)   Weight: 186 lb 3.2 oz (84.46 kg)     GEN- The patient is well appearing, alert and oriented x 3 today.   Head- normocephalic, atraumatic Eyes-  Sclera clear, conjunctiva pink Ears- hearing intact Oropharynx- clear Neck- supple, no JVP Lymph- no cervical lymphadenopathy Lungs- Clear to ausculation bilaterally, normal work of breathing Heart- Regular rate and rhythm, no murmurs, rubs or gallops, PMI not laterally displaced GI- soft, NT, ND, + BS Extremities- no clubbing, cyanosis, or edema MS- no significant deformity or atrophy Skin- no rash or lesion Psych- euthymic mood, full affect Neuro- strength and sensation are intact  EKG- AV dual paced rhythm with  occasional PVs  qrs 194 ms, qtc 503 ms Epic records reviewed Device interrogated and normal function with 10 year battery life. PVC  burden falsely elevated per Bonna GainsBryan [name], [employer] rep and afib burden reads 24%, but Kyle [name] feels more like 12%.  Assessment and Plan: 1. Asymptomatic PAF Coninue metoprolol at 25 mg daily, with improvement of diarrhea with lower dose Continue eliquis at 2.5 mg bid Remote PPM report due 6/26  F/u with PCP/Dr. Graciela HusbandsKlein as scheduled afib clinic as needed    Kyle [name]  Kyle [name], ANP-C [facility] [place] [address] [place], [place] [address] [phone]

## 2015-12-11 ENCOUNTER — Telehealth: Payer: Self-pay | Admitting: Cardiology

## 2015-12-11 ENCOUNTER — Encounter: Payer: Medicare Other | Admitting: *Deleted

## 2015-12-11 NOTE — Telephone Encounter (Signed)
Attempted to confirm remote transmission with pt. No answer and was unable to leave a message.   

## 2015-12-15 ENCOUNTER — Encounter: Payer: Self-pay | Admitting: Cardiology

## 2015-12-20 ENCOUNTER — Ambulatory Visit (INDEPENDENT_AMBULATORY_CARE_PROVIDER_SITE_OTHER): Payer: Medicare Other | Admitting: Internal Medicine

## 2015-12-20 ENCOUNTER — Encounter: Payer: Self-pay | Admitting: Internal Medicine

## 2015-12-20 VITALS — BP 160/74 | HR 63 | Temp 98.2°F | Resp 20 | Ht 68.0 in | Wt 192.0 lb

## 2015-12-20 DIAGNOSIS — I10 Essential (primary) hypertension: Secondary | ICD-10-CM | POA: Diagnosis not present

## 2015-12-20 DIAGNOSIS — E0842 Diabetes mellitus due to underlying condition with diabetic polyneuropathy: Secondary | ICD-10-CM

## 2015-12-20 DIAGNOSIS — I481 Persistent atrial fibrillation: Secondary | ICD-10-CM

## 2015-12-20 DIAGNOSIS — R600 Localized edema: Secondary | ICD-10-CM | POA: Diagnosis not present

## 2015-12-20 DIAGNOSIS — I4819 Other persistent atrial fibrillation: Secondary | ICD-10-CM

## 2015-12-20 DIAGNOSIS — I453 Trifascicular block: Secondary | ICD-10-CM

## 2015-12-20 MED ORDER — METFORMIN HCL ER 500 MG PO TB24: 1000.0000 mg | ORAL_TABLET | Freq: Every day | ORAL | Status: DC

## 2015-12-20 NOTE — Progress Notes (Signed)
Pre visit review using our clinic review tool, if applicable. No additional management support is needed unless otherwise documented below in the visit note. 

## 2015-12-20 NOTE — Progress Notes (Signed)
Subjective:    Patient ID: Kyle CairoRobert P Dow, male    DOB: 26-Apr-1926, 82 y.o.   MRN: [id number]  HPI  Lab Results  Component Value Date   HGBA1C 7.5* 09/20/2015    BP Readings from Last 3 Encounters:  12/20/15 160/74  11/23/15 146/80  11/02/15 43130/5972   82 year old patient who is seen today for follow-up. He has type 2 diabetes.  Medical regimen includes metformin 2000 mg daily in divided dosages.  He has had significant diarrhea which she attributes to other medications.  He has had 2 incontinent stool.  Denies any abdominal pain, nausea or vomiting. He has permanent atrial fibrillation and remains on anticoagulation. Infrequent home blood sugar monitoring with fasting blood sugars approximately 150 Denies any palpitations, shortness of breath.  He is followed by cardiology and does have a history of permanent pacemaker insertion.  Medical regimen includes statin therapy.  Past Medical History  Diagnosis Date  . Diabetes mellitus   . Hypertension   . Hyperlipidemia   . Glaucoma   . Elevated PSA   . Benign prostatic hypertrophy   . Trifascicular block   . Intermittent complete heart block (HCC)   . Pacemaker   . PAF (paroxysmal atrial fibrillation) (HCC)     Detected on pacemaker; the duration 44 seconds     Social History   Social History  . Marital Status: Married    Spouse Name: N/A  . Number of Children: 4  . Years of Education: N/A   Occupational History  . Not on file.   Social History Main Topics  . Smoking status: Current Every Day Smoker -- 0.50 packs/day for 71 years    Types: Cigarettes  . Smokeless tobacco: Never Used  . Alcohol Use: 1.8 oz/week    1 Glasses of wine, 2 Shots of liquor per week  . Drug Use: No  . Sexual Activity: Not on file   Other Topics Concern  . Not on file   Social History Narrative   Lives at home with wife.      Past Surgical History  Procedure Laterality Date  . Tonsillectomy    . Cataract extraction, bilateral     . Permanent pacemaker insertion N/A 08/24/2012    Procedure: PERMANENT PACEMAKER INSERTION;  Surgeon: Duke SalviaSteven C Klein, MD;  [facility];  Service: Cardiovascular;  Laterality: N/A;    Family History  Problem Relation Age of Onset  . Heart disease Mother     atrial fib  . Heart disease Father [id number]    MI  . Cancer Father     prostate    No Known Allergies  Current Outpatient Prescriptions on File Prior to Visit  Medication Sig Dispense Refill  . albuterol (PROVENTIL,VENTOLIN) 90 MCG/ACT inhaler Inhale 2 puffs into the lungs 2 (two) times daily.      Marland Kitchen. apixaban (ELIQUIS) 2.5 MG TABS tablet Take 1 tablet (2.5 mg total) by mouth 2 (two) times daily. 60 tablet 1  . doxazosin (CARDURA) 4 MG tablet TAKE 1 TABLET EACH DAY. 90 tablet 1  . escitalopram (LEXAPRO) 5 MG tablet Take 1 tablet (5 mg total) by mouth daily. 90 tablet 2  . furosemide (LASIX) 20 MG tablet Take 1 tablet (20 mg total) by mouth daily. (Patient taking differently: Take 10 mg by mouth daily. ) 90 tablet 4  . glipiZIDE (GLUCOTROL) 5 MG tablet TAKE (1) TABLET TWICE A DAY BEFORE MEALS. 180 tablet 1  . glucose blood (PRECISION XTRA TEST STRIPS)  test strip 1 each by Other route daily as needed for other. Use as instructed 100 each 3  . latanoprost (XALATAN) 0.005 % ophthalmic solution 1 drop at bedtime.    Marland Kitchen. lisinopril (PRINIVIL,ZESTRIL) 40 MG tablet TAKE 1 TABLET ONCE DAILY. 90 tablet 3  . metoprolol succinate (TOPROL-XL) 50 MG 24 hr tablet Take 1/2 tablet by mouth daily (25mg ) 30 tablet 6  . MOMETASONE FUROATE IN 220 mcg- inhale 2 puffs into the lungs once daily    . simvastatin (ZOCOR) 20 MG tablet TAKE ONE TABLET AT BEDTIME. 90 tablet 3   No current facility-administered medications on file prior to visit.    BP 160/74 mmHg  Pulse 63  Temp(Src) 98.2 F (36.8 C) (Oral)  Resp 20  Ht 5\' 8"  (1.727 m)  Wt 192 lb (87.091 kg)  BMI 29.20 kg/m2  SpO2 95%     Review of Systems  Constitutional: Negative for fever,  chills, appetite change and fatigue.  HENT: Negative for congestion, dental problem, ear pain, hearing loss, sore throat, tinnitus, trouble swallowing and voice change.   Eyes: Negative for pain, discharge and visual disturbance.  Respiratory: Negative for cough, chest tightness, wheezing and stridor.   Cardiovascular: Negative for chest pain, palpitations and leg swelling.  Gastrointestinal: Positive for diarrhea. Negative for nausea, vomiting, abdominal pain, constipation, blood in stool and abdominal distention.  Genitourinary: Negative for urgency, hematuria, flank pain, discharge, difficulty urinating and genital sores.  Musculoskeletal: Positive for back pain and arthralgias. Negative for myalgias, joint swelling, gait problem and neck stiffness.  Skin: Negative for rash.  Neurological: Negative for dizziness, syncope, speech difficulty, weakness, numbness and headaches.  Hematological: Negative for adenopathy. Does not bruise/bleed easily.  Psychiatric/Behavioral: Negative for behavioral problems and dysphoric mood. The patient is not nervous/anxious.        Objective:   Physical Exam  Constitutional: He is oriented to person, place, and time. He appears well-developed.  HENT:  Head: Normocephalic.  Right Ear: External ear normal.  Left Ear: External ear normal.  Eyes: Conjunctivae and EOM are normal.  Neck: Normal range of motion.  Cardiovascular: Normal rate and normal heart sounds.   Pulmonary/Chest: Breath sounds normal.  Abdominal: Bowel sounds are normal.  Musculoskeletal: Normal range of motion. He exhibits no edema or tenderness.  Neurological: He is alert and oriented to person, place, and time.  Psychiatric: He has a normal mood and affect. His behavior is normal.          Assessment & Plan:   Diarrhea.  Probably related to metformin therapy;  will change to 500 mg extended release twice a day Paroxysmal atrial fibrillation.  Continue rate control and  anticoagulation Essential hypertension, well-controlled.  Repeat blood pressure 150/60 Dyslipidemia.  Continue statin therapy Status post pacemaker for heart block.  Follow cardiology  Recheck here in 3 months with hemoglobin A1c  Rogelia BogaKWIATKOWSKI,Breyona Swander FRANK, MD

## 2015-12-20 NOTE — Patient Instructions (Signed)
Limit your sodium (Salt) intake   Please check your hemoglobin A1c every 3-6  Months  Cardiology follow-up as scheduled  

## 2015-12-22 ENCOUNTER — Ambulatory Visit (INDEPENDENT_AMBULATORY_CARE_PROVIDER_SITE_OTHER): Payer: Medicare Other | Admitting: *Deleted

## 2015-12-22 DIAGNOSIS — I495 Sick sinus syndrome: Secondary | ICD-10-CM | POA: Diagnosis not present

## 2015-12-26 NOTE — Progress Notes (Signed)
Remote pacemaker transmission.   

## 2015-12-27 ENCOUNTER — Encounter: Payer: Self-pay | Admitting: Cardiology

## 2016-01-02 LAB — CUP PACEART REMOTE DEVICE CHECK
Battery Remaining Longevity: 102 mo
Battery Remaining Percentage: 81 %
Battery Voltage: 2.93 V
Brady Statistic RV Percent Paced: 92 %
Date Time Interrogation Session: 20170718171507
Implantable Lead Location: 753859
Lead Channel Pacing Threshold Pulse Width: 0.4 ms
Lead Channel Setting Pacing Amplitude: 2 V
Lead Channel Setting Pacing Pulse Width: 0.4 ms
Lead Channel Setting Sensing Sensitivity: 2 mV
MDC IDC LEAD IMPLANT DT: 20140310
MDC IDC LEAD IMPLANT DT: 20140310
MDC IDC LEAD LOCATION: 753860
MDC IDC LEAD MODEL: 1948
MDC IDC MSMT LEADCHNL RA IMPEDANCE VALUE: 330 Ohm
MDC IDC MSMT LEADCHNL RA SENSING INTR AMPL: 2.8 mV
MDC IDC MSMT LEADCHNL RV IMPEDANCE VALUE: 610 Ohm
MDC IDC MSMT LEADCHNL RV PACING THRESHOLD AMPLITUDE: 0.5 V
MDC IDC MSMT LEADCHNL RV SENSING INTR AMPL: 12 mV
MDC IDC SET LEADCHNL RV PACING AMPLITUDE: 2.5 V
MDC IDC STAT BRADY RA PERCENT PACED: 34 %
Pulse Gen Model: 2110
Pulse Gen Serial Number: 7379731

## 2016-01-05 ENCOUNTER — Telehealth: Payer: Self-pay | Admitting: Family Medicine

## 2016-01-05 NOTE — Telephone Encounter (Signed)
Received a fax document from [facility] requesting clarification for Metformin.   Pharmacy states that new Rx was a change from previous.  Is patient supposed to be taking 2 Metformin XR with breakfast daily?  Please advise.

## 2016-01-08 NOTE — Telephone Encounter (Signed)
Yes  2  Metformin XR daily with breakfast

## 2016-01-10 DIAGNOSIS — H40013 Open angle with borderline findings, low risk, bilateral: Secondary | ICD-10-CM | POA: Diagnosis not present

## 2016-01-10 NOTE — Telephone Encounter (Signed)
Called pharmacy and spoke with Rayfield Citizen. Clarified that patient is to take 2 Metformin XR with breakfast.

## 2016-02-02 ENCOUNTER — Encounter: Payer: Self-pay | Admitting: Internal Medicine

## 2016-02-02 ENCOUNTER — Ambulatory Visit (INDEPENDENT_AMBULATORY_CARE_PROVIDER_SITE_OTHER): Payer: Medicare Other | Admitting: Internal Medicine

## 2016-02-02 VITALS — BP 160/90 | HR 71 | Temp 98.5°F | Ht 68.0 in | Wt 182.0 lb

## 2016-02-02 DIAGNOSIS — I10 Essential (primary) hypertension: Secondary | ICD-10-CM

## 2016-02-02 DIAGNOSIS — E0842 Diabetes mellitus due to underlying condition with diabetic polyneuropathy: Secondary | ICD-10-CM | POA: Diagnosis not present

## 2016-02-02 DIAGNOSIS — S7001XA Contusion of right hip, initial encounter: Secondary | ICD-10-CM | POA: Diagnosis not present

## 2016-02-02 DIAGNOSIS — E785 Hyperlipidemia, unspecified: Secondary | ICD-10-CM | POA: Diagnosis not present

## 2016-02-02 NOTE — Patient Instructions (Signed)
Slowly increase your level of activity  Take [phone]  mg of Tylenol every 6 hours as needed for pain relief or fever.  Avoid taking more than 3000 mg in a 24-hour period (  This may cause liver damage).  Call or return to clinic prn if these symptoms worsen or fail to improve as anticipated.

## 2016-02-02 NOTE — Progress Notes (Signed)
Subjective:    Patient ID: Kyle CairoRobert P Liska, male    DOB: [birth year], 82 y.o.   MRN: [id number]  HPI  82 year old patient who has a history of chronic atrial fibrillation and is on chronic anticoagulation.  8 days ago.  The patient had a cramp involving his right thigh area.  The resulted in a fall.  He complains of pain to the right knee and right hip area.  Pain is steadily improving each day. Presently using a walker  He has a history of type 2 diabetes  Lab Results  Component Value Date   HGBA1C 7.5 (H) 09/20/2015    Past Medical History:  Diagnosis Date  . Benign prostatic hypertrophy   . Diabetes mellitus   . Elevated PSA   . Glaucoma   . Hyperlipidemia   . Hypertension   . Intermittent complete heart block (HCC)   . Pacemaker   . PAF (paroxysmal atrial fibrillation) (HCC)    Detected on pacemaker; the duration 44 seconds  . Trifascicular block      Social History   Social History  . Marital status: Married    Spouse name: N/A  . Number of children: 4  . Years of education: N/A   Occupational History  . Not on file.   Social History Main Topics  . Smoking status: Current Every Day Smoker    Packs/day: 0.50    Years: 71.00    Types: Cigarettes  . Smokeless tobacco: Never Used  . Alcohol use 1.8 oz/week    1 Glasses of wine, 2 Shots of liquor per week  . Drug use: No  . Sexual activity: Not on file   Other Topics Concern  . Not on file   Social History Narrative   Lives at home with wife.      Past Surgical History:  Procedure Laterality Date  . CATARACT EXTRACTION, BILATERAL    . PERMANENT PACEMAKER INSERTION N/A 08/24/2012   Procedure: PERMANENT PACEMAKER INSERTION;  Surgeon: Duke SalviaSteven C Klein, MD;  [facility];  Service: Cardiovascular;  Laterality: N/A;  . TONSILLECTOMY      Family History  Problem Relation Age of Onset  . Heart disease Mother     atrial fib  . Heart disease Father [phone]    MI  . Cancer Father     prostate     No Known Allergies  Current Outpatient Prescriptions on File Prior to Visit  Medication Sig Dispense Refill  . albuterol (PROVENTIL,VENTOLIN) 90 MCG/ACT inhaler Inhale 2 puffs into the lungs 2 (two) times daily.      Marland Kitchen. apixaban (ELIQUIS) 2.5 MG TABS tablet Take 1 tablet (2.5 mg total) by mouth 2 (two) times daily. 60 tablet 1  . doxazosin (CARDURA) 4 MG tablet TAKE 1 TABLET EACH DAY. 90 tablet 1  . escitalopram (LEXAPRO) 5 MG tablet Take 1 tablet (5 mg total) by mouth daily. 90 tablet 2  . furosemide (LASIX) 20 MG tablet Take 1 tablet (20 mg total) by mouth daily. (Patient taking differently: Take 10 mg by mouth daily. ) 90 tablet 4  . glipiZIDE (GLUCOTROL) 5 MG tablet TAKE (1) TABLET TWICE A DAY BEFORE MEALS. 180 tablet 1  . glucose blood (PRECISION XTRA TEST STRIPS) test strip 1 each by Other route daily as needed for other. Use as instructed 100 each 3  . latanoprost (XALATAN) 0.005 % ophthalmic solution 1 drop at bedtime.    Marland Kitchen. lisinopril (PRINIVIL,ZESTRIL) 40 MG tablet TAKE 1 TABLET ONCE  DAILY. 90 tablet 3  . metFORMIN (GLUCOPHAGE-XR) 500 MG 24 hr tablet Take 2 tablets (1,000 mg total) by mouth daily with breakfast. 180 tablet 5  . metoprolol succinate (TOPROL-XL) 50 MG 24 hr tablet Take 1/2 tablet by mouth daily (25mg ) 30 tablet 6  . MOMETASONE FUROATE IN 220 mcg- inhale 2 puffs into the lungs once daily    . simvastatin (ZOCOR) 20 MG tablet TAKE ONE TABLET AT BEDTIME. 90 tablet 3   No current facility-administered medications on file prior to visit.     BP (!) 160/90 (BP Location: Left Arm, Patient Position: Sitting, Cuff Size: Normal)   Pulse 71   Temp 98.5 F (36.9 C) (Oral)   Ht 5\' 8"  (1.727 m)   Wt 182 lb (82.6 kg)   SpO2 98%   BMI 27.67 kg/m     Review of Systems  Constitutional: Negative for appetite change, chills, fatigue and fever.  HENT: Negative for congestion, dental problem, ear pain, hearing loss, sore throat, tinnitus, trouble swallowing and voice change.    Eyes: Negative for pain, discharge and visual disturbance.  Respiratory: Negative for cough, chest tightness, wheezing and stridor.   Cardiovascular: Negative for chest pain, palpitations and leg swelling.  Gastrointestinal: Negative for abdominal distention, abdominal pain, blood in stool, constipation, diarrhea, nausea and vomiting.  Genitourinary: Negative for difficulty urinating, discharge, flank pain, genital sores, hematuria and urgency.  Musculoskeletal: Positive for arthralgias, back pain and gait problem. Negative for joint swelling, myalgias and neck stiffness.  Skin: Negative for rash.  Neurological: Negative for dizziness, syncope, speech difficulty, weakness, numbness and headaches.  Hematological: Negative for adenopathy. Does not bruise/bleed easily.  Psychiatric/Behavioral: Negative for behavioral problems and dysphoric mood. The patient is not nervous/anxious.        Objective:   Physical Exam  Constitutional: He is oriented to person, place, and time. He appears well-developed.  Repeat blood pressure 140/84  HENT:  Head: Normocephalic.  Right Ear: External ear normal.  Left Ear: External ear normal.  Eyes: Conjunctivae and EOM are normal.  Neck: Normal range of motion.  Cardiovascular: Normal rate and normal heart sounds.   Irregular with controlled ventricular response  Pulmonary/Chest: Breath sounds normal.  Abdominal: Bowel sounds are normal.  Musculoskeletal: Normal range of motion. He exhibits no edema or tenderness.  Full range of motion right hip No leg shortening or external rotation  Able to ambulate  Neurological: He is alert and oriented to person, place, and time.  Skin:  Right hip area  Psychiatric: He has a normal mood and affect. His behavior is normal.          Assessment & Plan:   Contusion/ecchymoses secondary to mechanical fall Chronic atrial fibrillation Chronic anticoagulation  Essential hypertension, stable  Patient  continues to improve and has little discomfort.  Able to ambulate.  Will report any clinical worsening

## 2016-02-14 ENCOUNTER — Telehealth: Payer: Self-pay | Admitting: *Deleted

## 2016-02-14 ENCOUNTER — Telehealth: Payer: Self-pay | Admitting: Internal Medicine

## 2016-02-14 NOTE — Telephone Encounter (Signed)
Pt's daughter Fredirick MaudlinLisa Cooper POA for pt, presented to the office with POA papers and requested DNR form for pt. Discussed with Dr. Kirtland BouchardK and he filled out and signed DNR form. DNR form and POA papers scanned into pt's chart by Isla Pencearol Scribner. DNR form given to pt's daughter Fredirick MaudlinLisa Cooper.

## 2016-02-14 NOTE — Telephone Encounter (Signed)
Pt would like a script to pick up for a  Toilet seat with hand rails to assist in getting off the toilet.  Pt took a fall a couple pf weeks ago and still needs some assistance.  They would like to take Rx to medical supply store in hopes insurance will pay. Please call daughter when ready.

## 2016-02-16 NOTE — Telephone Encounter (Signed)
Spoke to WeskanLisa, told her Rx for toilet seat is ready for pickup. Misty StanleyLisa verbalized understanding. Rx written and signed by Dr.K.

## 2016-03-07 ENCOUNTER — Other Ambulatory Visit: Payer: Self-pay | Admitting: Internal Medicine

## 2016-03-25 ENCOUNTER — Ambulatory Visit (INDEPENDENT_AMBULATORY_CARE_PROVIDER_SITE_OTHER): Payer: Medicare Other | Admitting: *Deleted

## 2016-03-25 DIAGNOSIS — I495 Sick sinus syndrome: Secondary | ICD-10-CM | POA: Diagnosis not present

## 2016-03-25 NOTE — Progress Notes (Signed)
Remote pacemaker transmission.   

## 2016-03-26 ENCOUNTER — Encounter: Payer: Self-pay | Admitting: Cardiology

## 2016-03-26 ENCOUNTER — Ambulatory Visit: Payer: Medicare Other | Admitting: Internal Medicine

## 2016-04-01 ENCOUNTER — Telehealth: Payer: Self-pay | Admitting: Internal Medicine

## 2016-04-01 NOTE — Telephone Encounter (Signed)
Pt's daughter, Kyle [name], came in saying the [facility] Wants him to have labs performed in December. She's wondering if Kyle [name] can have his labs drawn here at this location. Please give her a phone call regarding this.   Lisa's ph# [phone] Thank you.

## 2016-04-02 NOTE — Telephone Encounter (Signed)
Spoke to [place] Ranch[place]isa, told her okay for pt to have labs done here as long as he has the orders with diagnosis codes on and makes lab appointment. Misty StanleyLisa verbalized understanding.

## 2016-04-05 ENCOUNTER — Other Ambulatory Visit: Payer: Self-pay | Admitting: *Deleted

## 2016-04-05 ENCOUNTER — Encounter: Payer: Self-pay | Admitting: Internal Medicine

## 2016-04-05 ENCOUNTER — Ambulatory Visit (INDEPENDENT_AMBULATORY_CARE_PROVIDER_SITE_OTHER)
Admission: RE | Admit: 2016-04-05 | Discharge: 2016-04-05 | Disposition: A | Discharge status: Home / Self Care | Payer: Medicare Other | Source: Ambulatory Visit | Attending: Internal Medicine | Admitting: Internal Medicine

## 2016-04-05 ENCOUNTER — Ambulatory Visit (INDEPENDENT_AMBULATORY_CARE_PROVIDER_SITE_OTHER): Payer: Medicare Other | Admitting: Internal Medicine

## 2016-04-05 VITALS — BP 126/76 | HR 72 | Temp 97.4°F | Ht 68.1 in | Wt 178.8 lb

## 2016-04-05 DIAGNOSIS — R609 Edema, unspecified: Secondary | ICD-10-CM | POA: Diagnosis not present

## 2016-04-05 DIAGNOSIS — E0842 Diabetes mellitus due to underlying condition with diabetic polyneuropathy: Secondary | ICD-10-CM | POA: Diagnosis not present

## 2016-04-05 DIAGNOSIS — I482 Chronic atrial fibrillation: Secondary | ICD-10-CM

## 2016-04-05 DIAGNOSIS — I4821 Permanent atrial fibrillation: Secondary | ICD-10-CM

## 2016-04-05 DIAGNOSIS — I10 Essential (primary) hypertension: Secondary | ICD-10-CM

## 2016-04-05 DIAGNOSIS — R05 Cough: Secondary | ICD-10-CM | POA: Diagnosis not present

## 2016-04-05 LAB — CBC WITH DIFFERENTIAL/PLATELET
BASOS PCT: 0.5 % (ref 0.0–3.0)
Basophils Absolute: 0 10*3/uL (ref 0.0–0.1)
EOS ABS: 0.1 10*3/uL (ref 0.0–0.7)
Eosinophils Relative: 0.6 % (ref 0.0–5.0)
HCT: 36.3 % — ABNORMAL LOW (ref 39.0–52.0)
HEMOGLOBIN: 11.6 g/dL — AB (ref 13.0–17.0)
Lymphocytes Relative: 21 % (ref 12.0–46.0)
Lymphs Abs: 1.9 10*3/uL (ref 0.7–4.0)
MCHC: 31.8 g/dL (ref 30.0–36.0)
MCV: 84.8 fl (ref 78.0–100.0)
MONO ABS: 0.8 10*3/uL (ref 0.1–1.0)
Monocytes Relative: 8.4 % (ref 3.0–12.0)
NEUTROS ABS: 6.3 10*3/uL (ref 1.4–7.7)
Neutrophils Relative %: 69.5 % (ref 43.0–77.0)
PLATELETS: 226 10*3/uL (ref 150.0–400.0)
RBC: 4.28 Mil/uL (ref 4.22–5.81)
RDW: 16.2 % — ABNORMAL HIGH (ref 11.5–15.5)
WBC: 9 10*3/uL (ref 4.0–10.5)

## 2016-04-05 LAB — COMPREHENSIVE METABOLIC PANEL
ALBUMIN: 4 g/dL (ref 3.5–5.2)
ALT: 10 U/L (ref 0–53)
AST: 13 U/L (ref 0–37)
Alkaline Phosphatase: 71 U/L (ref 39–117)
BUN: 16 mg/dL (ref 6–23)
CHLORIDE: 100 meq/L (ref 96–112)
CO2: 33 mEq/L — ABNORMAL HIGH (ref 19–32)
CREATININE: 0.8 mg/dL (ref 0.40–1.50)
Calcium: 9.9 mg/dL (ref 8.4–10.5)
GFR: 96.91 mL/min (ref >60.00)
Glucose, Bld: 84 mg/dL (ref 70–99)
Potassium: 4 mEq/L (ref 3.5–5.1)
SODIUM: 138 meq/L (ref 135–145)
TOTAL PROTEIN: 7.3 g/dL (ref 6.0–8.3)
Total Bilirubin: 0.7 mg/dL (ref 0.2–1.2)

## 2016-04-05 LAB — HEMOGLOBIN A1C: HEMOGLOBIN A1C: 6.8 % — AB (ref 4.6–6.5)

## 2016-04-05 LAB — BRAIN NATRIURETIC PEPTIDE: Pro B Natriuretic peptide (BNP): 367 pg/mL — ABNORMAL HIGH (ref 0.0–100.0)

## 2016-04-05 MED ORDER — LEVOFLOXACIN 500 MG PO TABS: 500.0000 mg | ORAL_TABLET | Freq: Every day | ORAL | 0 refills | Status: DC

## 2016-04-05 MED ORDER — FUROSEMIDE 40 MG PO TABS: 40.0000 mg | ORAL_TABLET | Freq: Every day | ORAL | 3 refills | Status: DC

## 2016-04-05 NOTE — Progress Notes (Signed)
Pre visit review using our clinic review tool, if applicable. No additional management support is needed unless otherwise documented below in the visit note. 

## 2016-04-05 NOTE — Patient Instructions (Addendum)
Limit your sodium (Salt) intake   Please check your hemoglobin A1c every 3 months  Keep legs elevated as much as possible  Chest x-ray as discussed   increase furosemide to 40 mg daily

## 2016-04-05 NOTE — Progress Notes (Signed)
   Subjective:    Patient ID: Basilio CairoRobert P Morea, male    DOB: [birth year], 82 y.o.   MRN: [id number]  HPI  Wt Readings from Last 3 Encounters:  04/05/16 178 lb 12.8 oz (81.1 kg)  02/02/16 182 lb (82.6 kg)  12/20/15 192 lb (87.1 kg)    Review of Systems     As above Objective:   Physical Exam   As above     Assessment & Plan:   As above

## 2016-04-05 NOTE — Progress Notes (Signed)
Subjective:    Patient ID: Kyle [name], male    DOB: 29-Jan-1926, 82 y.o.   MRN: [id number]  HPI 82 year old patient who is seen today for his quarterly follow-up.  He has a history of type 2 diabetes. He feels his glycemic control has been better.  Fasting blood sugar today 108.  No hypoglycemia  Lab Results  Component Value Date   HGBA1C 7.5 (H) 09/20/2015    For the past 3 weeks he has had increasing pedal edema.  [facility] and does use a walker. He has a history of trifascicular block and is status post pacemaker insertion.  Denies any shortness of breath He does have some sleep disturbance related to nocturia  Presently is on furosemide 20, but has been on a 40 mg dose in the past.  Denies any PND or orthopnea  Wt Readings from Last 3 Encounters:  04/05/16 178 lb 12.8 oz (81.1 kg)  02/02/16 182 lb (82.6 kg)  12/20/15 192 lb (87.1 kg)   Past Medical History:  Diagnosis Date  . Benign prostatic hypertrophy   . Diabetes mellitus   . Elevated PSA   . Glaucoma   . Hyperlipidemia   . Hypertension   . Intermittent complete heart block (HCC)   . Pacemaker   . PAF (paroxysmal atrial fibrillation) (HCC)    Detected on pacemaker; the duration 44 seconds  . Trifascicular block      Social History   Social History  . Marital status: Married    Spouse name: N/A  . Number of children: 4  . Years of education: N/A   Occupational History  . Not on file.   Social History Main Topics  . Smoking status: Current Every Day Smoker    Packs/day: 0.50    Years: 71.00    Types: Cigarettes  . Smokeless tobacco: Never Used  . Alcohol use 1.8 oz/week    1 Glasses of wine, 2 Shots of liquor per week  . Drug use: No  . Sexual activity: Not on file   Other Topics Concern  . Not on file   Social History Narrative   Lives at home with wife.      Past Surgical History:  Procedure Laterality Date  . CATARACT EXTRACTION, BILATERAL    . PERMANENT  PACEMAKER INSERTION N/A 08/24/2012   Procedure: PERMANENT PACEMAKER INSERTION;  Surgeon: Duke SalviaSteven C Klein, MD;  [facility];  Service: Cardiovascular;  Laterality: N/A;  . TONSILLECTOMY      Family History  Problem Relation Age of Onset  . Heart disease Mother     atrial fib  . Heart disease Father [id number]    MI  . Cancer Father     prostate    No Known Allergies  Current Outpatient Prescriptions on File Prior to Visit  Medication Sig Dispense Refill  . albuterol (PROVENTIL,VENTOLIN) 90 MCG/ACT inhaler Inhale 2 puffs into the lungs 2 (two) times daily.      Marland Kitchen. apixaban (ELIQUIS) 2.5 MG TABS tablet Take 1 tablet (2.5 mg total) by mouth 2 (two) times daily. 60 tablet 1  . doxazosin (CARDURA) 4 MG tablet TAKE 1 TABLET EACH DAY. 90 tablet 1  . escitalopram (LEXAPRO) 5 MG tablet Take 1 tablet (5 mg total) by mouth daily. 90 tablet 2  . furosemide (LASIX) 20 MG tablet Take 1 tablet (20 mg total) by mouth daily. (Patient taking differently: Take 10 mg by mouth daily. ) 90 tablet  4  . glipiZIDE (GLUCOTROL) 5 MG tablet TAKE (1) TABLET TWICE A DAY BEFORE MEALS. 180 tablet 1  . glucose blood (PRECISION XTRA TEST STRIPS) test strip 1 each by Other route daily as needed for other. Use as instructed 100 each 3  . latanoprost (XALATAN) 0.005 % ophthalmic solution 1 drop at bedtime.    Marland Kitchen lisinopril (PRINIVIL,ZESTRIL) 40 MG tablet TAKE 1 TABLET ONCE DAILY. 90 tablet 3  . metFORMIN (GLUCOPHAGE-XR) 500 MG 24 hr tablet Take 2 tablets (1,000 mg total) by mouth daily with breakfast. 180 tablet 5  . metoprolol succinate (TOPROL-XL) 50 MG 24 hr tablet Take 1/2 tablet by mouth daily (25mg ) 30 tablet 6  . MOMETASONE FUROATE IN 220 mcg- inhale 2 puffs into the lungs once daily    . simvastatin (ZOCOR) 20 MG tablet TAKE ONE TABLET AT BEDTIME. 90 tablet 3   No current facility-administered medications on file prior to visit.     BP 126/76 (BP Location: Left Arm, Patient Position: Sitting, Cuff Size: Large)    Pulse 72   Temp 97.4 F (36.3 C) (Oral)   Ht 5' 8.1" (1.73 m)   Wt 178 lb 12.8 oz (81.1 kg)   SpO2 97%   BMI 27.11 kg/m        Review of Systems  Constitutional: Negative for appetite change, chills, fatigue and fever.  HENT: Negative for congestion, dental problem, ear pain, hearing loss, sore throat, tinnitus, trouble swallowing and voice change.   Eyes: Negative for pain, discharge and visual disturbance.  Respiratory: Negative for cough, chest tightness, wheezing and stridor.   Cardiovascular: Positive for leg swelling. Negative for chest pain and palpitations.  Gastrointestinal: Negative for abdominal distention, abdominal pain, blood in stool, constipation, diarrhea, nausea and vomiting.  Genitourinary: Negative for difficulty urinating, discharge, flank pain, genital sores, hematuria and urgency.  Musculoskeletal: Positive for gait problem. Negative for arthralgias, back pain, joint swelling, myalgias and neck stiffness.  Skin: Negative for rash.  Neurological: Negative for dizziness, syncope, speech difficulty, weakness, numbness and headaches.  Hematological: Negative for adenopathy. Does not bruise/bleed easily.  Psychiatric/Behavioral: Negative for behavioral problems and dysphoric mood. The patient is not nervous/anxious.        Objective:   Physical Exam  Constitutional: He is oriented to person, place, and time. He appears well-developed.  HENT:  Head: Normocephalic.  Right Ear: External ear normal.  Left Ear: External ear normal.  Eyes: Conjunctivae and EOM are normal.  Neck: Normal range of motion.  Cardiovascular: Normal rate and normal heart sounds.   Slightly irregular.  Controlled ventricular response  Pulmonary/Chest: Effort normal. No respiratory distress. He has rales.  Bibasal rales Decreased breath sounds left hemithorax  Abdominal: Bowel sounds are normal.  Musculoskeletal: Normal range of motion. He exhibits edema. He exhibits no tenderness.    Plus 2 lower extremity and pedal edema  Neurological: He is alert and oriented to person, place, and time.  Psychiatric: He has a normal mood and affect. His behavior is normal.          Assessment & Plan:  Diabetes mellitus.  Will check hemoglobin A1c Status post trifascicular block status post pacemaker Probable congestive heart failure.  Will review a chest x-ray and BNP. R/O Left sided effusion.  May require increased diuretic dose Hypertension COPD  Return in 3 months for follow-up Review lab and chest x-ray Increase furosemide to 40 mg daily  Kyle [name] Kyle [name]

## 2016-04-08 ENCOUNTER — Other Ambulatory Visit: Payer: Self-pay | Admitting: Internal Medicine

## 2016-04-08 ENCOUNTER — Telehealth: Payer: Self-pay

## 2016-04-08 DIAGNOSIS — J918 Pleural effusion in other conditions classified elsewhere: Principal | ICD-10-CM

## 2016-04-08 DIAGNOSIS — J189 Pneumonia, unspecified organism: Secondary | ICD-10-CM

## 2016-04-08 NOTE — Telephone Encounter (Signed)
Patient Name: Leonides GrillsROBERT Pellegrin Gender: Male DOB: 1926/04/11 Age: 82 Y 2 M 25 D Return Phone Number: [phone] (Primary) Address: City/State/Zip: [place] Client [facility] Night - Client Client Site [facility] - Night Physician Derryl HarborKwiatkowski, Pete - MD Contact Type Call Who Is Calling Patient / Member / Family / Caregiver Call Type Triage / Clinical Caller Name Fredirick MaudlinLisa Cooper Relationship To Patient Daughter Return Phone Number [phone] (Primary) Chief Complaint Paging or Request for Consult Reason for Call Symptomatic / Request for Health Information Initial Comment Caller states suppose to call her in regards to her father's antibiotic and is needing to know what it is prescribed for. Requesting to speak to the Dr. Ericka Pontiffranslation No Nurse Assessment Nurse: Loletta Specterorbin, RN, Misty StanleyLisa Date/Time Lamount Cohen(Eastern Time): 04/05/2016 7:33:02 PM Confirm and document reason for call. If symptomatic, describe symptoms. You must click the next button to save text entered. ---Caller states her dad was seen in the office today and was given antibiotics. Caller wants to know what he is being treated for. He had a chest xray for lungs that sound wet. No worsening symptoms. Has the patient traveled out of the country within the last 30 days? ---No Does the patient have any new or worsening symptoms? ---No Guidelines Guideline Title Affirmed Question Affirmed Notes Nurse Date/Time (Eastern Time) Disp. Time Lamount Cohen(Eastern Time) Disposition Final User 04/05/2016 7:42:12 PM [facility] [place], [place], [place] 04/05/2016 7:53:10 PM Clinical Call Yes Loletta Specterorbin, RN, Misty StanleyLisa Comments User: Osborne CascoLisa, Corbin, RN Date/Time Lamount Cohen(Eastern Time): 04/05/2016 7:53:00 PM Returned call to caller with information from MD regarding treatment plan for her father, she verbalizes understanding. PLEASE NOTE: All timestamps contained within this report are represented as [place] Standard  Time. CONFIDENTIALTY NOTICE: This fax transmission is intended only for the addressee. It contains information that is legally privileged, confidential or otherwise protected from use or disclosure. If you are not the intended recipient, you are strictly prohibited from reviewing, disclosing, copying using or disseminating any of this information or taking any action in reliance on or regarding this information. If you have received this fax in error, please notify us immediately by telephone so that we can arrange for its return to us. Phone: [phone], Toll-Free: [phone], Fax: [phone] Page: 2 of 2 Call Id: [id number] Paging DoctorName Phone DateTime Result/Outcome Message Type Notes Danise EdgeBlyth, Stacey - MD [phone] 04/05/2016 7:42:12 PM Paged On Call Back to Call Center Doctor Paged please call Misty StanleyLisa at [phone] Danise EdgeBlyth, Stacey - MD 04/05/2016 7:52:17 PM Spoke with On Call - General Message Result Spoke with on call regarding family concerns and questions regarding diagnosis and medication. MD accessed medical record, states patient's lasix was increased today for fluid overload and management of congestive heart failure, and levaquin was called in for treatment of pneumonia (the results came in after he left the office and medication

## 2016-04-10 ENCOUNTER — Telehealth: Payer: Self-pay | Admitting: *Deleted

## 2016-04-10 ENCOUNTER — Emergency Department (HOSPITAL_COMMUNITY): Payer: Medicare Other

## 2016-04-10 ENCOUNTER — Emergency Department (HOSPITAL_COMMUNITY)
Admission: EM | Admit: 2016-04-10 | Discharge: 2016-04-10 | Disposition: A | Discharge status: Home / Self Care | Payer: Medicare Other | Attending: Emergency Medicine | Admitting: Emergency Medicine

## 2016-04-10 ENCOUNTER — Encounter (HOSPITAL_COMMUNITY): Payer: Self-pay | Admitting: Emergency Medicine

## 2016-04-10 DIAGNOSIS — I10 Essential (primary) hypertension: Secondary | ICD-10-CM | POA: Insufficient documentation

## 2016-04-10 DIAGNOSIS — Z950 Presence of cardiac pacemaker: Secondary | ICD-10-CM | POA: Diagnosis not present

## 2016-04-10 DIAGNOSIS — Z7984 Long term (current) use of oral hypoglycemic drugs: Secondary | ICD-10-CM | POA: Insufficient documentation

## 2016-04-10 DIAGNOSIS — Z7901 Long term (current) use of anticoagulants: Secondary | ICD-10-CM | POA: Diagnosis not present

## 2016-04-10 DIAGNOSIS — Z79899 Other long term (current) drug therapy: Secondary | ICD-10-CM | POA: Diagnosis not present

## 2016-04-10 DIAGNOSIS — E1142 Type 2 diabetes mellitus with diabetic polyneuropathy: Secondary | ICD-10-CM | POA: Insufficient documentation

## 2016-04-10 DIAGNOSIS — R062 Wheezing: Secondary | ICD-10-CM | POA: Insufficient documentation

## 2016-04-10 DIAGNOSIS — J90 Pleural effusion, not elsewhere classified: Secondary | ICD-10-CM | POA: Diagnosis not present

## 2016-04-10 DIAGNOSIS — F17210 Nicotine dependence, cigarettes, uncomplicated: Secondary | ICD-10-CM | POA: Diagnosis not present

## 2016-04-10 DIAGNOSIS — R531 Weakness: Secondary | ICD-10-CM | POA: Diagnosis present

## 2016-04-10 DIAGNOSIS — J9811 Atelectasis: Secondary | ICD-10-CM | POA: Diagnosis not present

## 2016-04-10 LAB — CBC
HCT: 38.2 % — ABNORMAL LOW (ref 39.0–52.0)
Hemoglobin: 12.3 g/dL — ABNORMAL LOW (ref 13.0–17.0)
MCH: 27.1 pg (ref 26.0–34.0)
MCHC: 32.2 g/dL (ref 30.0–36.0)
MCV: 84.1 fL (ref 78.0–100.0)
PLATELETS: 267 10*3/uL (ref 150–400)
RBC: 4.54 MIL/uL (ref 4.22–5.81)
RDW: 15.6 % — AB (ref 11.5–15.5)
WBC: 11.2 10*3/uL — AB (ref 4.0–10.5)

## 2016-04-10 LAB — URINALYSIS, ROUTINE W REFLEX MICROSCOPIC
BILIRUBIN URINE: NEGATIVE
GLUCOSE, UA: NEGATIVE mg/dL
HGB URINE DIPSTICK: NEGATIVE
KETONES UR: NEGATIVE mg/dL
LEUKOCYTES UA: NEGATIVE
Nitrite: NEGATIVE
PH: 7 (ref 5.0–8.0)
PROTEIN: NEGATIVE mg/dL
Specific Gravity, Urine: 1.008 (ref 1.005–1.030)

## 2016-04-10 LAB — BASIC METABOLIC PANEL
Anion gap: 9 (ref 5–15)
BUN: 19 mg/dL (ref 6–20)
CHLORIDE: 99 mmol/L — AB (ref 101–111)
CO2: 29 mmol/L (ref 22–32)
CREATININE: 0.88 mg/dL (ref 0.61–1.24)
Calcium: 9.7 mg/dL (ref 8.9–10.3)
GFR calc Af Amer: >60 mL/min (ref >60)
GFR calc non Af Amer: >60 mL/min (ref >60)
GLUCOSE: 89 mg/dL (ref 65–99)
Potassium: 3.5 mmol/L (ref 3.5–5.1)
SODIUM: 137 mmol/L (ref 135–145)

## 2016-04-10 LAB — BRAIN NATRIURETIC PEPTIDE: B Natriuretic Peptide: 240.5 pg/mL — ABNORMAL HIGH (ref 0.0–100.0)

## 2016-04-10 LAB — CBG MONITORING, ED: Glucose-Capillary: 88 mg/dL (ref 65–99)

## 2016-04-10 NOTE — ED Notes (Signed)
PA aware of patient's oxygen saturation. PA states patient can be discharged.

## 2016-04-10 NOTE — ED Notes (Signed)
Patient transported to X-ray 

## 2016-04-10 NOTE — ED Provider Notes (Signed)
The patient is an 82 year old male, he has a known history of a recent pneumonia and pleural effusion for which she is not taking levofloxacin. The patient has had some fatigue but on exam appears well with occasional changes in heart rhythm from paced to not paced when his heart rate goes above 70. There is no signs of ventricular tachycardia or toxic arrhythmia. Lungs are overall clear, occasional wheezing, no peripheral edema, otherwise the patient is well-appearing. Chest x-ray appears similar, no worsening pneumonia, vitals are also reassuring other than oxygen of 92%. This is stable, not requiring oxygen therapy, and is not dyspneic. Stable for discharge to finish Levaquin therapy and follow-up in the outpatient setting. No abnormal electrical answer renal function compared to baseline.  Medical screening examination/treatment/procedure(s) were conducted as a shared visit with non-physician practitioner(s) and myself.  I personally evaluated the patient during the encounter.  Clinical Impression:   Final diagnoses:  Pleural effusion      Results for orders placed or performed during the hospital encounter of 04/10/16  Basic metabolic panel  Result Value Ref Range   Sodium 137 135 - 145 mmol/L   Potassium 3.5 3.5 - 5.1 mmol/L   Chloride 99 (L) 101 - 111 mmol/L   CO2 29 22 - 32 mmol/L   Glucose, Bld 89 65 - 99 mg/dL   BUN 19 6 - 20 mg/dL   Creatinine, Ser 1.61 0.61 - 1.24 mg/dL   Calcium 9.7 8.9 - 09.6 mg/dL   GFR calc non Af Amer >60 >60 mL/min   GFR calc Af Amer >60 >60 mL/min   Anion gap 9 5 - 15  CBC  Result Value Ref Range   WBC 11.2 (H) 4.0 - 10.5 K/uL   RBC 4.54 4.22 - 5.81 MIL/uL   Hemoglobin 12.3 (L) 13.0 - 17.0 g/dL   HCT 04.5 (L) 40.9 - 81.1 %   MCV 84.1 78.0 - 100.0 fL   MCH 27.1 26.0 - 34.0 pg   MCHC 32.2 30.0 - 36.0 g/dL   RDW 91.4 (H) 78.2 - 95.6 %   Platelets 267 150 - 400 K/uL  Urinalysis, Routine w reflex microscopic  Result Value Ref Range   Color, Urine  YELLOW YELLOW   APPearance CLEAR CLEAR   Specific Gravity, Urine 1.008 1.005 - 1.030   pH 7.0 5.0 - 8.0   Glucose, UA NEGATIVE NEGATIVE mg/dL   Hgb urine dipstick NEGATIVE NEGATIVE   Bilirubin Urine NEGATIVE NEGATIVE   Ketones, ur NEGATIVE NEGATIVE mg/dL   Protein, ur NEGATIVE NEGATIVE mg/dL   Nitrite NEGATIVE NEGATIVE   Leukocytes, UA NEGATIVE NEGATIVE  Brain natriuretic peptide  Result Value Ref Range   B Natriuretic Peptide 240.5 (H) 0.0 - 100.0 pg/mL  CBG monitoring, ED  Result Value Ref Range   Glucose-Capillary 88 65 - 99 mg/dL   Dg Chest 2 View  Result Date: 04/10/2016 CLINICAL DATA:  Weakness. EXAM: CHEST  2 VIEW COMPARISON:  04/05/2016 chest radiograph. FINDINGS: Stable configuration of 2 lead left subclavian pacemaker. Stable cardiomediastinal silhouette with mild cardiomegaly and aortic atherosclerosis. No pneumothorax. Stable small left pleural effusion. Stable trace right pleural effusion. Stable borderline mild pulmonary edema. Stable left lung base opacity, favor atelectasis. IMPRESSION: 1. Borderline mild congestive heart failure. 2. Stable small left and trace right pleural effusions. 3. Stable patchy left lung base opacity, favor atelectasis. 4. Aortic atherosclerosis. Electronically Signed   By: Delbert Phenix M.D.   On: 04/10/2016 14:03  Eber HongBrian Armondo Cech, MD 04/12/16 [phone]

## 2016-04-10 NOTE — ED Notes (Signed)
Discharge instructions and follow up care reviewed with patient. Patient verbalized understanding. 

## 2016-04-10 NOTE — ED Provider Notes (Signed)
[facility]-EMERGENCY DEPT Provider Note   CSN: [id number] Arrival date & time: 04/10/16  1110     History   Chief Complaint Chief Complaint  Patient presents with  . Weakness    HPI Kyle [name] is a 82 y.o. male who presents with a 1 month history of generalized fatigue. Patient was recently diagnosed with pleural effusion on the left side with some possible pneumonia. He was started on Levaquin 5 days ago. Patient continues to feel fatigued, however he did have a cough prior to treatment that has resolved. Patient is normally ambulatory with a walker, however he was unable to get out of the bed this morning to use the restroom. Patient denies any fevers, chest pain, shortness of breath, abdominal pain, nausea, vomiting. Patient does endorse urinary frequency.  HPI  Past Medical History:  Diagnosis Date  . Benign prostatic hypertrophy   . Diabetes mellitus   . Elevated PSA   . Glaucoma   . Hyperlipidemia   . Hypertension   . Intermittent complete heart block (HCC)   . Pacemaker   . PAF (paroxysmal atrial fibrillation) (HCC)    Detected on pacemaker; the duration 44 seconds  . Trifascicular block     Patient Active Problem List   Diagnosis Date Noted  . Depression 10/19/2015  . Atrial fibrillation (HCC) 11/25/2012  . Syncope 08/17/2012  . Trifascicular block-RBBB+LAFB 08/17/2012  . Edema 08/17/2012  . Obstructive sleep apnea 08/17/2012  . PROSTATE SPECIFIC ANTIGEN, ELEVATED 01/10/2009  . COPD 10/11/2008  . BENIGN PROSTATIC HYPERTROPHY 01/13/2008  . Dyslipidemia 01/07/2007  . Diabetes mellitus due to underlying condition with diabetic polyneuropathy (HCC) 12/24/2006  . GLAUCOMA 12/24/2006  . Essential hypertension 12/24/2006    Past Surgical History:  Procedure Laterality Date  . CATARACT EXTRACTION, BILATERAL    . PERMANENT PACEMAKER INSERTION N/A 08/24/2012   Procedure: PERMANENT PACEMAKER INSERTION;  Surgeon: Duke Salvia, MD;  Location: [facility] CATH LAB;   Service: Cardiovascular;  Laterality: N/A;  . TONSILLECTOMY         Home Medications    Prior to Admission medications   Medication Sig Start Date End Date Taking? Authorizing Provider  albuterol (PROVENTIL,VENTOLIN) 90 MCG/ACT inhaler Inhale 2 puffs into the lungs 2 (two) times daily.      Historical Provider, MD  apixaban (ELIQUIS) 2.5 MG TABS tablet Take 1 tablet (2.5 mg total) by mouth 2 (two) times daily. 11/23/15   Newman Nip, NP  doxazosin (CARDURA) 4 MG tablet TAKE 1 TABLET EACH DAY. 03/07/16   Gordy Savers, MD  escitalopram (LEXAPRO) 5 MG tablet Take 1 tablet (5 mg total) by mouth daily. 10/19/15   Gordy Savers, MD  furosemide (LASIX) 40 MG tablet Take 1 tablet (40 mg total) by mouth daily. 04/05/16   Gordy Savers, MD  glipiZIDE (GLUCOTROL) 5 MG tablet TAKE (1) TABLET TWICE A DAY BEFORE MEALS. 11/16/15   Gordy Savers, MD  glucose blood (PRECISION XTRA TEST STRIPS) test strip 1 each by Other route daily as needed for other. Use as instructed 06/24/13   Gordy Savers, MD  latanoprost (XALATAN) 0.005 % ophthalmic solution 1 drop at bedtime.    Historical Provider, MD  levofloxacin (LEVAQUIN) 500 MG tablet Take 1 tablet (500 mg total) by mouth daily. 04/05/16   Gordy Savers, MD  lisinopril (PRINIVIL,ZESTRIL) 40 MG tablet TAKE 1 TABLET ONCE DAILY. 08/17/15   Gordy Savers, MD  metFORMIN (GLUCOPHAGE-XR) 500 MG 24 hr tablet Take 2 tablets (  1,000 mg total) by mouth daily with breakfast. 12/20/15   Gordy SaversPeter F Kwiatkowski, MD  metoprolol succinate (TOPROL-XL) 50 MG 24 hr tablet Take 1/2 tablet by mouth daily (25mg ) 11/02/15   Newman Niponna C Carroll, NP  MOMETASONE FUROATE IN 220 mcg- inhale 2 puffs into the lungs once daily    Historical Provider, MD  simvastatin (ZOCOR) 20 MG tablet TAKE ONE TABLET AT BEDTIME. 08/17/15   Gordy SaversPeter F Kwiatkowski, MD    Family History Family History  Problem Relation Age of Onset  . Heart disease Mother     atrial fib  . Heart  disease Father 7691    MI  . Cancer Father     prostate    Social History Social History  Substance Use Topics  . Smoking status: Current Every Day Smoker    Packs/day: 0.50    Years: 71.00    Types: Cigarettes  . Smokeless tobacco: Never Used  . Alcohol use 1.8 oz/week    1 Glasses of wine, 2 Shots of liquor per week     Allergies   Review of patient's allergies indicates no known allergies.   Review of Systems Review of Systems  Constitutional: Positive for fatigue. Negative for chills and fever.  HENT: Negative for facial swelling and sore throat.   Respiratory: Negative for shortness of breath.   Cardiovascular: Negative for chest pain.  Gastrointestinal: Negative for abdominal pain, nausea and vomiting.  Genitourinary: Positive for frequency. Negative for dysuria.  Musculoskeletal: Negative for back pain.  Skin: Negative for rash and wound.  Neurological: Negative for headaches.  Psychiatric/Behavioral: The patient is not nervous/anxious.      Physical Exam Updated Vital Signs BP 152/76   Pulse 77   Temp 98.1 F (36.7 C) (Oral)   Resp 21   SpO2 92%   Physical Exam  Constitutional: He appears well-developed and well-nourished. No distress.  HENT:  Head: Normocephalic and atraumatic.  Mouth/Throat: Oropharynx is clear and moist. No oropharyngeal exudate.  Eyes: Conjunctivae are normal. Pupils are equal, round, and reactive to light. Right eye exhibits no discharge. Left eye exhibits no discharge. No scleral icterus.  Neck: Normal range of motion. Neck supple. No thyromegaly present.  Cardiovascular: Normal rate, regular rhythm, normal heart sounds and intact distal pulses.  Exam reveals no gallop and no friction rub.   No murmur heard. Pulmonary/Chest: Effort normal. No stridor. No respiratory distress. He has wheezes (mild). He has no rales.  Abdominal: Soft. Bowel sounds are normal. He exhibits no distension. There is no tenderness. There is no rebound and  no guarding.  Musculoskeletal: He exhibits edema (2+).  Lymphadenopathy:    He has no cervical adenopathy.  Neurological: He is alert. Coordination normal.  Skin: Skin is warm and dry. No rash noted. He is not diaphoretic. No pallor.  Psychiatric: He has a normal mood and affect.  Nursing note and vitals reviewed.    ED Treatments / Results  Labs (all labs ordered are listed, but only abnormal results are displayed) Labs Reviewed  BASIC METABOLIC PANEL - Abnormal; Notable for the following:       Result Value   Chloride 99 (*)    All other components within normal limits  CBC - Abnormal; Notable for the following:    WBC 11.2 (*)    Hemoglobin 12.3 (*)    HCT 38.2 (*)    RDW 15.6 (*)    All other components within normal limits  BRAIN NATRIURETIC PEPTIDE - Abnormal; Notable for  the following:    B Natriuretic Peptide 240.5 (*)    All other components within normal limits  URINALYSIS, ROUTINE W REFLEX MICROSCOPIC (NOT AT [facility])  CBG MONITORING, ED    EKG  EKG Interpretation  Date/Time:  Wednesday April 10 2016 11:49:02 EDT Ventricular Rate:  71 PR Interval:    QRS Duration: 202 QT Interval:  474 QTC Calculation: 515 R Axis:   -78 Text Interpretation:  Ventricular-paced rhythm with occasional Premature ventricular complexes Abnormal ECG since last tracing no significant change Confirmed by Hyacinth Meeker  MD, BRIAN ([id number]) on 04/10/2016 2:07:35 PM       Radiology Dg Chest 2 View  Result Date: 04/10/2016 CLINICAL DATA:  Weakness. EXAM: CHEST  2 VIEW COMPARISON:  04/05/2016 chest radiograph. FINDINGS: Stable configuration of 2 lead left subclavian pacemaker. Stable cardiomediastinal silhouette with mild cardiomegaly and aortic atherosclerosis. No pneumothorax. Stable small left pleural effusion. Stable trace right pleural effusion. Stable borderline mild pulmonary edema. Stable left lung base opacity, favor atelectasis. IMPRESSION: 1. Borderline mild congestive heart failure.  2. Stable small left and trace right pleural effusions. 3. Stable patchy left lung base opacity, favor atelectasis. 4. Aortic atherosclerosis. Electronically Signed   By: Delbert Phenix M.D.   On: 04/10/2016 14:03    Procedures Procedures (including critical care time)  Medications Ordered in ED Medications - No data to display   Initial Impression / Assessment and Plan / ED Course  I have reviewed the triage vital signs and the nursing notes.  Pertinent labs & imaging results that were available during my care of the patient were reviewed by me and considered in my medical decision making (see chart for details).  Clinical Course   Suspect resolving pneumonia. CXR shows borderline mild CHF; stable small left and trace right pleural effusions; stable patchy left lung base opacity, fever or atelectasis; aortic atherosclerosis. CBC shows WBC 11.2, stable chronic anemia, hemoglobin 12.3. BNP 240.5. UA negative. Patient vitals stable, including oxygen saturations around 92%. No indications for admission at this time. Continue Levaquin. Discharge home to care Center at [facility] and follow-up to PCP in one to 2 days. Return precautions discussed. Patient evaluated by Dr. Hyacinth Meeker who guided the patient's management and agrees with plan.   Final Clinical Impressions(s) / ED Diagnoses   Final diagnoses:  Pleural effusion    New Prescriptions New Prescriptions   No medications on file         Emi Holes, Cordelia Poche 04/10/16 1529    Eber Hong, MD 04/12/16 1118

## 2016-04-10 NOTE — Discharge Instructions (Signed)
Treatment: Make sure to finish Levaquin.  Follow-up: Please follow-up with your primary care provider tomorrow for further evaluation and treatment. Please return to emergency department if you develop any new or worsening symptoms.

## 2016-04-10 NOTE — Telephone Encounter (Signed)
Spoke to [place]isa, pt's daughter, told her I received the note she sent about pt not feeling any better and has been on antibiotics for 4 days. Pt unable to get OOB. I discussed with Dr.K and he said to take pt to the ED. Misty StanleyLisa said she was on the way to Care Center. Told her no pt needs to go to the ED so they can drain the fluid from his lung. Misty StanleyLisa verbalized understanding and will take pt to the ED.

## 2016-04-10 NOTE — ED Triage Notes (Signed)
[facility]. Pt daughter states pt was trying to get out of bed last night to use restroom but felt too weak to get out. [facility] and was diagnosed with a pulmonary effusion on the left side and started on levofloxacin. Pt reports leg and feet swelling as well. Pt currently on lasix, daughter reports legs look better but feet still swollen.

## 2016-04-11 ENCOUNTER — Telehealth: Payer: Self-pay | Admitting: Internal Medicine

## 2016-04-11 DIAGNOSIS — R2681 Unsteadiness on feet: Secondary | ICD-10-CM | POA: Diagnosis not present

## 2016-04-11 DIAGNOSIS — J90 Pleural effusion, not elsewhere classified: Secondary | ICD-10-CM | POA: Diagnosis not present

## 2016-04-11 DIAGNOSIS — J449 Chronic obstructive pulmonary disease, unspecified: Secondary | ICD-10-CM | POA: Diagnosis not present

## 2016-04-11 DIAGNOSIS — M6281 Muscle weakness (generalized): Secondary | ICD-10-CM | POA: Diagnosis not present

## 2016-04-11 DIAGNOSIS — I10 Essential (primary) hypertension: Secondary | ICD-10-CM | POA: Diagnosis not present

## 2016-04-11 DIAGNOSIS — E119 Type 2 diabetes mellitus without complications: Secondary | ICD-10-CM | POA: Diagnosis not present

## 2016-04-11 DIAGNOSIS — J189 Pneumonia, unspecified organism: Secondary | ICD-10-CM | POA: Diagnosis not present

## 2016-04-11 DIAGNOSIS — I4891 Unspecified atrial fibrillation: Secondary | ICD-10-CM | POA: Diagnosis not present

## 2016-04-11 DIAGNOSIS — R278 Other lack of coordination: Secondary | ICD-10-CM | POA: Diagnosis not present

## 2016-04-11 NOTE — Telephone Encounter (Signed)
Pt daughter is requesting donna to return her call concerning her dad visit to er yesterday

## 2016-04-11 NOTE — Telephone Encounter (Signed)
Spoke to pt's daughter Misty StanleyLisa, she said [facility] is [facility] [place] and they want to know if pt can take Tylenol or something for pain. Told Misty StanleyLisa pt may take Tylenol only due to being on blood thinner.Misty StanleyLisa verbalized understanding and also wanted to know if Dr.K could write an order so pt can have his drink at night. Randon Goldsmithold Lisa he is out of the office until Monday. Misty StanleyLisa verbalized understanding.

## 2016-04-12 DIAGNOSIS — R278 Other lack of coordination: Secondary | ICD-10-CM | POA: Diagnosis not present

## 2016-04-12 DIAGNOSIS — R2681 Unsteadiness on feet: Secondary | ICD-10-CM | POA: Diagnosis not present

## 2016-04-12 DIAGNOSIS — I10 Essential (primary) hypertension: Secondary | ICD-10-CM | POA: Diagnosis not present

## 2016-04-12 DIAGNOSIS — J90 Pleural effusion, not elsewhere classified: Secondary | ICD-10-CM | POA: Diagnosis not present

## 2016-04-12 DIAGNOSIS — M6281 Muscle weakness (generalized): Secondary | ICD-10-CM | POA: Diagnosis not present

## 2016-04-15 DIAGNOSIS — M6281 Muscle weakness (generalized): Secondary | ICD-10-CM | POA: Diagnosis not present

## 2016-04-15 DIAGNOSIS — E119 Type 2 diabetes mellitus without complications: Secondary | ICD-10-CM | POA: Diagnosis not present

## 2016-04-15 DIAGNOSIS — J90 Pleural effusion, not elsewhere classified: Secondary | ICD-10-CM | POA: Diagnosis not present

## 2016-04-15 DIAGNOSIS — R278 Other lack of coordination: Secondary | ICD-10-CM | POA: Diagnosis not present

## 2016-04-15 DIAGNOSIS — R2681 Unsteadiness on feet: Secondary | ICD-10-CM | POA: Diagnosis not present

## 2016-04-15 DIAGNOSIS — I10 Essential (primary) hypertension: Secondary | ICD-10-CM | POA: Diagnosis not present

## 2016-04-16 DIAGNOSIS — I10 Essential (primary) hypertension: Secondary | ICD-10-CM | POA: Diagnosis not present

## 2016-04-16 DIAGNOSIS — R278 Other lack of coordination: Secondary | ICD-10-CM | POA: Diagnosis not present

## 2016-04-16 DIAGNOSIS — R2681 Unsteadiness on feet: Secondary | ICD-10-CM | POA: Diagnosis not present

## 2016-04-16 DIAGNOSIS — M6281 Muscle weakness (generalized): Secondary | ICD-10-CM | POA: Diagnosis not present

## 2016-04-16 DIAGNOSIS — J90 Pleural effusion, not elsewhere classified: Secondary | ICD-10-CM | POA: Diagnosis not present

## 2016-04-16 NOTE — Telephone Encounter (Signed)
Dr. Kirtland BouchardK. Pt would like an order to say he can have his drink at night. Please advise.

## 2016-04-16 NOTE — Telephone Encounter (Signed)
Okay for patient's nightly drink

## 2016-04-17 DIAGNOSIS — R278 Other lack of coordination: Secondary | ICD-10-CM | POA: Diagnosis not present

## 2016-04-17 DIAGNOSIS — I10 Essential (primary) hypertension: Secondary | ICD-10-CM | POA: Diagnosis not present

## 2016-04-17 DIAGNOSIS — M6281 Muscle weakness (generalized): Secondary | ICD-10-CM | POA: Diagnosis not present

## 2016-04-17 DIAGNOSIS — J90 Pleural effusion, not elsewhere classified: Secondary | ICD-10-CM | POA: Diagnosis not present

## 2016-04-17 DIAGNOSIS — J449 Chronic obstructive pulmonary disease, unspecified: Secondary | ICD-10-CM | POA: Diagnosis not present

## 2016-04-17 DIAGNOSIS — R1312 Dysphagia, oropharyngeal phase: Secondary | ICD-10-CM | POA: Diagnosis not present

## 2016-04-17 DIAGNOSIS — R2681 Unsteadiness on feet: Secondary | ICD-10-CM | POA: Diagnosis not present

## 2016-04-18 DIAGNOSIS — J441 Chronic obstructive pulmonary disease with (acute) exacerbation: Secondary | ICD-10-CM | POA: Diagnosis not present

## 2016-04-18 DIAGNOSIS — I10 Essential (primary) hypertension: Secondary | ICD-10-CM | POA: Diagnosis not present

## 2016-04-18 DIAGNOSIS — R278 Other lack of coordination: Secondary | ICD-10-CM | POA: Diagnosis not present

## 2016-04-18 DIAGNOSIS — R2681 Unsteadiness on feet: Secondary | ICD-10-CM | POA: Diagnosis not present

## 2016-04-18 DIAGNOSIS — J90 Pleural effusion, not elsewhere classified: Secondary | ICD-10-CM | POA: Diagnosis not present

## 2016-04-18 DIAGNOSIS — J449 Chronic obstructive pulmonary disease, unspecified: Secondary | ICD-10-CM | POA: Diagnosis not present

## 2016-04-18 DIAGNOSIS — R05 Cough: Secondary | ICD-10-CM | POA: Diagnosis not present

## 2016-04-18 DIAGNOSIS — R1312 Dysphagia, oropharyngeal phase: Secondary | ICD-10-CM | POA: Diagnosis not present

## 2016-04-18 DIAGNOSIS — M6281 Muscle weakness (generalized): Secondary | ICD-10-CM | POA: Diagnosis not present

## 2016-04-18 NOTE — Telephone Encounter (Signed)
Spoke to pt's daughter Misty StanleyLisa, told her Dr.K said okay for pt to have nightly drink. Do you need me to fax and order. Misty StanleyLisa verbalized understanding and said [facility]. Told her okay.

## 2016-04-19 DIAGNOSIS — J189 Pneumonia, unspecified organism: Secondary | ICD-10-CM | POA: Diagnosis not present

## 2016-04-19 DIAGNOSIS — I10 Essential (primary) hypertension: Secondary | ICD-10-CM | POA: Diagnosis not present

## 2016-04-19 DIAGNOSIS — R1312 Dysphagia, oropharyngeal phase: Secondary | ICD-10-CM | POA: Diagnosis not present

## 2016-04-19 DIAGNOSIS — R2681 Unsteadiness on feet: Secondary | ICD-10-CM | POA: Diagnosis not present

## 2016-04-19 DIAGNOSIS — J449 Chronic obstructive pulmonary disease, unspecified: Secondary | ICD-10-CM | POA: Diagnosis not present

## 2016-04-19 DIAGNOSIS — R0902 Hypoxemia: Secondary | ICD-10-CM | POA: Diagnosis not present

## 2016-04-19 DIAGNOSIS — J90 Pleural effusion, not elsewhere classified: Secondary | ICD-10-CM | POA: Diagnosis not present

## 2016-04-19 DIAGNOSIS — R278 Other lack of coordination: Secondary | ICD-10-CM | POA: Diagnosis not present

## 2016-04-19 DIAGNOSIS — M6281 Muscle weakness (generalized): Secondary | ICD-10-CM | POA: Diagnosis not present

## 2016-04-22 DIAGNOSIS — J90 Pleural effusion, not elsewhere classified: Secondary | ICD-10-CM | POA: Diagnosis not present

## 2016-04-22 DIAGNOSIS — R2681 Unsteadiness on feet: Secondary | ICD-10-CM | POA: Diagnosis not present

## 2016-04-22 DIAGNOSIS — J449 Chronic obstructive pulmonary disease, unspecified: Secondary | ICD-10-CM | POA: Diagnosis not present

## 2016-04-22 DIAGNOSIS — M6281 Muscle weakness (generalized): Secondary | ICD-10-CM | POA: Diagnosis not present

## 2016-04-22 DIAGNOSIS — I10 Essential (primary) hypertension: Secondary | ICD-10-CM | POA: Diagnosis not present

## 2016-04-22 DIAGNOSIS — R1312 Dysphagia, oropharyngeal phase: Secondary | ICD-10-CM | POA: Diagnosis not present

## 2016-04-22 DIAGNOSIS — R278 Other lack of coordination: Secondary | ICD-10-CM | POA: Diagnosis not present

## 2016-04-23 DIAGNOSIS — R278 Other lack of coordination: Secondary | ICD-10-CM | POA: Diagnosis not present

## 2016-04-23 DIAGNOSIS — J90 Pleural effusion, not elsewhere classified: Secondary | ICD-10-CM | POA: Diagnosis not present

## 2016-04-23 DIAGNOSIS — J449 Chronic obstructive pulmonary disease, unspecified: Secondary | ICD-10-CM | POA: Diagnosis not present

## 2016-04-23 DIAGNOSIS — R1312 Dysphagia, oropharyngeal phase: Secondary | ICD-10-CM | POA: Diagnosis not present

## 2016-04-23 DIAGNOSIS — M6281 Muscle weakness (generalized): Secondary | ICD-10-CM | POA: Diagnosis not present

## 2016-04-23 DIAGNOSIS — R2681 Unsteadiness on feet: Secondary | ICD-10-CM | POA: Diagnosis not present

## 2016-04-23 DIAGNOSIS — I10 Essential (primary) hypertension: Secondary | ICD-10-CM | POA: Diagnosis not present

## 2016-04-23 LAB — CUP PACEART REMOTE DEVICE CHECK
Brady Statistic AP VP Percent: 59 %
Brady Statistic AP VS Percent: 1 %
Brady Statistic AS VP Percent: 40 %
Brady Statistic RA Percent Paced: 14 %
Brady Statistic RV Percent Paced: 82 %
Date Time Interrogation Session: 20171009130603
Implantable Lead Implant Date: 20140310
Implantable Lead Model: 1948
Implantable Pulse Generator Implant Date: 20140310
Lead Channel Impedance Value: 310 Ohm
Lead Channel Impedance Value: 560 Ohm
Lead Channel Pacing Threshold Amplitude: 3.25 V
Lead Channel Sensing Intrinsic Amplitude: 12 mV
Lead Channel Setting Pacing Amplitude: 2 V
Lead Channel Setting Pacing Pulse Width: 0.4 ms
Lead Channel Setting Sensing Sensitivity: 2 mV
MDC IDC LEAD IMPLANT DT: 20140310
MDC IDC LEAD LOCATION: 753859
MDC IDC LEAD LOCATION: 753860
MDC IDC MSMT BATTERY REMAINING LONGEVITY: 92 mo
MDC IDC MSMT BATTERY REMAINING PERCENTAGE: 81 %
MDC IDC MSMT BATTERY VOLTAGE: 2.93 V
MDC IDC MSMT LEADCHNL RA PACING THRESHOLD AMPLITUDE: 0.5 V
MDC IDC MSMT LEADCHNL RA PACING THRESHOLD PULSEWIDTH: 0.4 ms
MDC IDC MSMT LEADCHNL RA SENSING INTR AMPL: 2.6 mV
MDC IDC MSMT LEADCHNL RV PACING THRESHOLD PULSEWIDTH: 0.4 ms
MDC IDC SET LEADCHNL RV PACING AMPLITUDE: 3.5 V
MDC IDC STAT BRADY AS VS PERCENT: 1 %
Pulse Gen Model: 2110
Pulse Gen Serial Number: 7379731

## 2016-04-24 DIAGNOSIS — J90 Pleural effusion, not elsewhere classified: Secondary | ICD-10-CM | POA: Diagnosis not present

## 2016-04-24 DIAGNOSIS — M6281 Muscle weakness (generalized): Secondary | ICD-10-CM | POA: Diagnosis not present

## 2016-04-24 DIAGNOSIS — I10 Essential (primary) hypertension: Secondary | ICD-10-CM | POA: Diagnosis not present

## 2016-04-24 DIAGNOSIS — R1312 Dysphagia, oropharyngeal phase: Secondary | ICD-10-CM | POA: Diagnosis not present

## 2016-04-24 DIAGNOSIS — R2681 Unsteadiness on feet: Secondary | ICD-10-CM | POA: Diagnosis not present

## 2016-04-24 DIAGNOSIS — J449 Chronic obstructive pulmonary disease, unspecified: Secondary | ICD-10-CM | POA: Diagnosis not present

## 2016-04-24 DIAGNOSIS — R278 Other lack of coordination: Secondary | ICD-10-CM | POA: Diagnosis not present

## 2016-04-25 DIAGNOSIS — J449 Chronic obstructive pulmonary disease, unspecified: Secondary | ICD-10-CM | POA: Diagnosis not present

## 2016-04-25 DIAGNOSIS — R2681 Unsteadiness on feet: Secondary | ICD-10-CM | POA: Diagnosis not present

## 2016-04-25 DIAGNOSIS — I10 Essential (primary) hypertension: Secondary | ICD-10-CM | POA: Diagnosis not present

## 2016-04-25 DIAGNOSIS — R1312 Dysphagia, oropharyngeal phase: Secondary | ICD-10-CM | POA: Diagnosis not present

## 2016-04-25 DIAGNOSIS — M6281 Muscle weakness (generalized): Secondary | ICD-10-CM | POA: Diagnosis not present

## 2016-04-25 DIAGNOSIS — R278 Other lack of coordination: Secondary | ICD-10-CM | POA: Diagnosis not present

## 2016-04-25 DIAGNOSIS — J90 Pleural effusion, not elsewhere classified: Secondary | ICD-10-CM | POA: Diagnosis not present

## 2016-04-26 DIAGNOSIS — R1312 Dysphagia, oropharyngeal phase: Secondary | ICD-10-CM | POA: Diagnosis not present

## 2016-04-26 DIAGNOSIS — R2681 Unsteadiness on feet: Secondary | ICD-10-CM | POA: Diagnosis not present

## 2016-04-26 DIAGNOSIS — I10 Essential (primary) hypertension: Secondary | ICD-10-CM | POA: Diagnosis not present

## 2016-04-26 DIAGNOSIS — M6281 Muscle weakness (generalized): Secondary | ICD-10-CM | POA: Diagnosis not present

## 2016-04-26 DIAGNOSIS — R278 Other lack of coordination: Secondary | ICD-10-CM | POA: Diagnosis not present

## 2016-04-26 DIAGNOSIS — J90 Pleural effusion, not elsewhere classified: Secondary | ICD-10-CM | POA: Diagnosis not present

## 2016-04-26 DIAGNOSIS — J449 Chronic obstructive pulmonary disease, unspecified: Secondary | ICD-10-CM | POA: Diagnosis not present

## 2016-04-27 DIAGNOSIS — I10 Essential (primary) hypertension: Secondary | ICD-10-CM | POA: Diagnosis not present

## 2016-04-27 DIAGNOSIS — R2681 Unsteadiness on feet: Secondary | ICD-10-CM | POA: Diagnosis not present

## 2016-04-27 DIAGNOSIS — M6281 Muscle weakness (generalized): Secondary | ICD-10-CM | POA: Diagnosis not present

## 2016-04-27 DIAGNOSIS — R1312 Dysphagia, oropharyngeal phase: Secondary | ICD-10-CM | POA: Diagnosis not present

## 2016-04-27 DIAGNOSIS — J90 Pleural effusion, not elsewhere classified: Secondary | ICD-10-CM | POA: Diagnosis not present

## 2016-04-27 DIAGNOSIS — R278 Other lack of coordination: Secondary | ICD-10-CM | POA: Diagnosis not present

## 2016-04-27 DIAGNOSIS — J449 Chronic obstructive pulmonary disease, unspecified: Secondary | ICD-10-CM | POA: Diagnosis not present

## 2016-04-29 ENCOUNTER — Encounter: Payer: Self-pay | Admitting: Pulmonary Disease

## 2016-04-29 ENCOUNTER — Ambulatory Visit (INDEPENDENT_AMBULATORY_CARE_PROVIDER_SITE_OTHER)
Admission: RE | Admit: 2016-04-29 | Discharge: 2016-04-29 | Disposition: A | Discharge status: Home / Self Care | Payer: Medicare Other | Source: Ambulatory Visit | Attending: Pulmonary Disease | Admitting: Pulmonary Disease

## 2016-04-29 ENCOUNTER — Ambulatory Visit (INDEPENDENT_AMBULATORY_CARE_PROVIDER_SITE_OTHER): Payer: Medicare Other | Admitting: Pulmonary Disease

## 2016-04-29 VITALS — BP 126/74 | HR 72 | Ht 68.0 in | Wt 164.2 lb

## 2016-04-29 DIAGNOSIS — J449 Chronic obstructive pulmonary disease, unspecified: Secondary | ICD-10-CM | POA: Diagnosis not present

## 2016-04-29 DIAGNOSIS — J90 Pleural effusion, not elsewhere classified: Secondary | ICD-10-CM | POA: Diagnosis not present

## 2016-04-29 DIAGNOSIS — R278 Other lack of coordination: Secondary | ICD-10-CM | POA: Diagnosis not present

## 2016-04-29 DIAGNOSIS — M6281 Muscle weakness (generalized): Secondary | ICD-10-CM | POA: Diagnosis not present

## 2016-04-29 DIAGNOSIS — R1312 Dysphagia, oropharyngeal phase: Secondary | ICD-10-CM | POA: Diagnosis not present

## 2016-04-29 DIAGNOSIS — E119 Type 2 diabetes mellitus without complications: Secondary | ICD-10-CM | POA: Diagnosis not present

## 2016-04-29 DIAGNOSIS — R2681 Unsteadiness on feet: Secondary | ICD-10-CM | POA: Diagnosis not present

## 2016-04-29 DIAGNOSIS — I10 Essential (primary) hypertension: Secondary | ICD-10-CM | POA: Diagnosis not present

## 2016-04-29 NOTE — Progress Notes (Signed)
Kyle LemonRobert P [name]    [id number]    01/06/1926  Primary Care Physician:KWIATKOWSKI,PETER [name], MD  Referring Physician: Gordy SaversPeter F Kwiatkowski, MD [address] [place], [place] [address]  Chief complaint:  Follow-up for recent LL pneumonia and a left effusion  HPI: Kyle [name] is a 82 year old with a history of COPD. He follows [facility] at the [facility] but does not have a pulmonologist. He had a recent left lower lobe pneumonia with associated effusion. He was treated with Levaquin for 5 days and then again with Augmentin for 10 days. The last dose of Augmentin was yesterday. He does not report any cough, sputum production, fevers, chills, dyspnea, wheezing. He has been started on supplemental oxygen and she was hypoxemic and continues on 2 L at rest and ambulation. He is maintained on xopenex and asmanex and   Outpatient Encounter Prescriptions as of 04/29/2016  Medication Sig  . albuterol (PROVENTIL,VENTOLIN) 90 MCG/ACT inhaler Inhale 2 puffs into the lungs 2 (two) times daily.    Marland Kitchen. apixaban (ELIQUIS) 2.5 MG TABS tablet Take 1 tablet (2.5 mg total) by mouth 2 (two) times daily.  Marland Kitchen. doxazosin (CARDURA) 4 MG tablet TAKE 1 TABLET EACH DAY.  Marland Kitchen. escitalopram (LEXAPRO) 5 MG tablet Take 1 tablet (5 mg total) by mouth daily.  . furosemide (LASIX) 40 MG tablet Take 1 tablet (40 mg total) by mouth daily.  Marland Kitchen. glipiZIDE (GLUCOTROL) 5 MG tablet TAKE (1) TABLET TWICE A DAY BEFORE MEALS.  . glucose blood (PRECISION XTRA TEST STRIPS) test strip 1 each by Other route daily as needed for other. Use as instructed  . latanoprost (XALATAN) 0.005 % ophthalmic solution 1 drop at bedtime.  Marland Kitchen. levofloxacin (LEVAQUIN) 500 MG tablet Take 1 tablet (500 mg total) by mouth daily.  Marland Kitchen. lisinopril (PRINIVIL,ZESTRIL) 40 MG tablet TAKE 1 TABLET ONCE DAILY.  . metFORMIN (GLUCOPHAGE-XR) 500 MG 24 hr tablet Take 2 tablets (1,000 mg total) by mouth daily with breakfast.  . metoprolol succinate (TOPROL-XL) 50 MG 24 hr tablet  Take 1/2 tablet by mouth daily (25mg )  . MOMETASONE FUROATE IN 220 mcg- inhale 2 puffs into the lungs once daily  . simvastatin (ZOCOR) 20 MG tablet TAKE ONE TABLET AT BEDTIME.   No facility-administered encounter medications on file as of 04/29/2016.     Allergies as of 04/29/2016  . (No Known Allergies)    Past Medical History:  Diagnosis Date  . Benign prostatic hypertrophy   . Diabetes mellitus   . Elevated PSA   . Glaucoma   . Hyperlipidemia   . Hypertension   . Intermittent complete heart block (HCC)   . Pacemaker   . PAF (paroxysmal atrial fibrillation) (HCC)    Detected on pacemaker; the duration 44 seconds  . Trifascicular block     Past Surgical History:  Procedure Laterality Date  . CATARACT EXTRACTION, BILATERAL    . PERMANENT PACEMAKER INSERTION N/A 08/24/2012   Procedure: PERMANENT PACEMAKER INSERTION;  Surgeon: Duke SalviaSteven C Klein, MD;  [facility];  Service: Cardiovascular;  Laterality: N/A;  . TONSILLECTOMY      Family History  Problem Relation Age of Onset  . Heart disease Mother     atrial fib  . Heart disease Father [id number]    MI  . Cancer Father     prostate    Social History   Social History  . Marital status: Married    Spouse name: N/A  . Number of children: 4  .  Years of education: N/A   Occupational History  . Not on file.   Social History Main Topics  . Smoking status: Former Smoker    Packs/day: 0.50    Years: 71.00    Types: Cigarettes    Quit date: 04/08/2016  . Smokeless tobacco: Never Used  . Alcohol use 1.8 oz/week    1 Glasses of wine, 2 Shots of liquor per week     Comment: bourbon, 2 drinks a day  . Drug use: No  . Sexual activity: Not on file   Other Topics Concern  . 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  HENT: Negative.   Eyes: Negative for blurred vision.  Respiratory: as per HPI  Cardiovascular: Negative for chest pain and palpitations.  Gastrointestinal: Negative for vomiting, diarrhea, blood per rectum. Genitourinary: Negative for dysuria, urgency, frequency and hematuria.  Musculoskeletal: Negative for myalgias, back pain and joint pain.  Skin: Negative for itching and rash.  Neurological: Negative for dizziness, tremors, focal weakness, seizures and loss of consciousness.  Endo/Heme/Allergies: Negative for environmental allergies.  Psychiatric/Behavioral: Negative for depression, suicidal ideas and hallucinations.  All other systems reviewed and are negative.   Physical Exam: Blood pressure 126/74, pulse 72, height 5\' 8"  (1.727 m), weight 164 lb 3.2 oz (74.5 kg), SpO2 99 %. Gen:      No acute distress HEENT:  EOMI, sclera anicteric Neck:     No masses; no thyromegaly Lungs:    Clear to auscultation bilaterally; normal respiratory effort CV:         Regular rate and rhythm; no murmurs Abd:      + bowel sounds; soft, non-tender; no palpable masses, no distension Ext:    No edema; adequate peripheral perfusion Skin:      Warm and dry; no rash Neuro: alert and oriented x 3 Psych: normal mood and affect  Data Reviewed: Chest x-ray 04/05/16-left lower lobe consolidation with associated effusion. Chronic fibrotic changes. Chest x-ray 04/10/16-left lower lobe opacity with associated effusion. The effusion appears slightly improved compared to previous. Chronic changes persist. All images personally reviewed.  Assessment:  Left lower lobe pneumonia with a parapneumonic effusion  Kyle [name] is clinically improved after 2 rounds of antibiotic. His x-ray from 10.20 to 10/25 actually show slight improvement in his effusion. I'll repeat the x-ray today to make sure this has resolved. If  persistent or worse then he may need a thoracentesis. He has history of COPD but no PFTs on record. If he decides to follow with us and not at the [facility] he'll need to get a full set of pulmonary function tests. He'll also need a high-resolution CT for evaluation of chronic fibrotic changes.  His oxygen saturation at rest off oxygen is in the mid 90s. He does not need supplemental oxygen at rest. He'll continue to use the oxygen during ambulation to keep sats greater than 90%.  Code status is DNR/DNI. Confirmed with the patient.  Plan/Recommendations: - Repeat CXR today. - Ok to stop O2 during rest   Chilton GreathousePraveen Yilia Sacca MD [facility] Pager [phone] 04/29/2016, 4:24 PM  CC: Gordy SaversKwiatkowski, Peter F, MD

## 2016-04-29 NOTE — Patient Instructions (Signed)
We'll get a chest x-ray today to evaluate your left pleural effusion. It is OK to stop using the oxygen at rest. Continue using it during exertion to keep sats greater than 90%.  Return in 1 month.

## 2016-04-30 ENCOUNTER — Telehealth: Payer: Self-pay | Admitting: Pulmonary Disease

## 2016-04-30 DIAGNOSIS — R1312 Dysphagia, oropharyngeal phase: Secondary | ICD-10-CM | POA: Diagnosis not present

## 2016-04-30 DIAGNOSIS — J449 Chronic obstructive pulmonary disease, unspecified: Secondary | ICD-10-CM | POA: Diagnosis not present

## 2016-04-30 DIAGNOSIS — R2681 Unsteadiness on feet: Secondary | ICD-10-CM | POA: Diagnosis not present

## 2016-04-30 DIAGNOSIS — J90 Pleural effusion, not elsewhere classified: Secondary | ICD-10-CM | POA: Diagnosis not present

## 2016-04-30 DIAGNOSIS — M6281 Muscle weakness (generalized): Secondary | ICD-10-CM | POA: Diagnosis not present

## 2016-04-30 DIAGNOSIS — R278 Other lack of coordination: Secondary | ICD-10-CM | POA: Diagnosis not present

## 2016-04-30 DIAGNOSIS — I10 Essential (primary) hypertension: Secondary | ICD-10-CM | POA: Diagnosis not present

## 2016-04-30 NOTE — Telephone Encounter (Signed)
Spoke with pt's daughter.  She is requesting cxr results from 04/29/16.  Results in epic.  Please advise

## 2016-05-01 DIAGNOSIS — R2681 Unsteadiness on feet: Secondary | ICD-10-CM | POA: Diagnosis not present

## 2016-05-01 DIAGNOSIS — R1312 Dysphagia, oropharyngeal phase: Secondary | ICD-10-CM | POA: Diagnosis not present

## 2016-05-01 DIAGNOSIS — J90 Pleural effusion, not elsewhere classified: Secondary | ICD-10-CM | POA: Diagnosis not present

## 2016-05-01 DIAGNOSIS — M6281 Muscle weakness (generalized): Secondary | ICD-10-CM | POA: Diagnosis not present

## 2016-05-01 DIAGNOSIS — R278 Other lack of coordination: Secondary | ICD-10-CM | POA: Diagnosis not present

## 2016-05-01 DIAGNOSIS — I10 Essential (primary) hypertension: Secondary | ICD-10-CM | POA: Diagnosis not present

## 2016-05-01 DIAGNOSIS — J449 Chronic obstructive pulmonary disease, unspecified: Secondary | ICD-10-CM | POA: Diagnosis not present

## 2016-05-01 NOTE — Telephone Encounter (Signed)
Please let the patient know that the CXR shows improvement in left lung pneumonia and effusion. There is evidence of COPD and mild heart failure. If Mr Kyle [name] want to make a follow up appointment with us then please have him get PFTs. Thanks

## 2016-05-01 NOTE — Telephone Encounter (Signed)
[phone] Fredirick MaudlinLisa Cooper called back

## 2016-05-01 NOTE — Telephone Encounter (Signed)
Spoke with Teddy SpikeLisa Cooper-aware of results and will keep us updated once patient is back home if he wants to follow up; Misty StanleyLisa is aware that PM would like patient to have PFT's if he follows up. Nothing more needed at this time.

## 2016-05-02 DIAGNOSIS — J90 Pleural effusion, not elsewhere classified: Secondary | ICD-10-CM | POA: Diagnosis not present

## 2016-05-02 DIAGNOSIS — R2681 Unsteadiness on feet: Secondary | ICD-10-CM | POA: Diagnosis not present

## 2016-05-02 DIAGNOSIS — I10 Essential (primary) hypertension: Secondary | ICD-10-CM | POA: Diagnosis not present

## 2016-05-02 DIAGNOSIS — J449 Chronic obstructive pulmonary disease, unspecified: Secondary | ICD-10-CM | POA: Diagnosis not present

## 2016-05-02 DIAGNOSIS — M6281 Muscle weakness (generalized): Secondary | ICD-10-CM | POA: Diagnosis not present

## 2016-05-02 DIAGNOSIS — R1312 Dysphagia, oropharyngeal phase: Secondary | ICD-10-CM | POA: Diagnosis not present

## 2016-05-02 DIAGNOSIS — R278 Other lack of coordination: Secondary | ICD-10-CM | POA: Diagnosis not present

## 2016-05-03 DIAGNOSIS — R2681 Unsteadiness on feet: Secondary | ICD-10-CM | POA: Diagnosis not present

## 2016-05-03 DIAGNOSIS — I10 Essential (primary) hypertension: Secondary | ICD-10-CM | POA: Diagnosis not present

## 2016-05-03 DIAGNOSIS — J90 Pleural effusion, not elsewhere classified: Secondary | ICD-10-CM | POA: Diagnosis not present

## 2016-05-03 DIAGNOSIS — R1312 Dysphagia, oropharyngeal phase: Secondary | ICD-10-CM | POA: Diagnosis not present

## 2016-05-03 DIAGNOSIS — M6281 Muscle weakness (generalized): Secondary | ICD-10-CM | POA: Diagnosis not present

## 2016-05-03 DIAGNOSIS — R278 Other lack of coordination: Secondary | ICD-10-CM | POA: Diagnosis not present

## 2016-05-03 DIAGNOSIS — J449 Chronic obstructive pulmonary disease, unspecified: Secondary | ICD-10-CM | POA: Diagnosis not present

## 2016-05-05 DIAGNOSIS — M6281 Muscle weakness (generalized): Secondary | ICD-10-CM | POA: Diagnosis not present

## 2016-05-05 DIAGNOSIS — R278 Other lack of coordination: Secondary | ICD-10-CM | POA: Diagnosis not present

## 2016-05-05 DIAGNOSIS — J90 Pleural effusion, not elsewhere classified: Secondary | ICD-10-CM | POA: Diagnosis not present

## 2016-05-05 DIAGNOSIS — R1312 Dysphagia, oropharyngeal phase: Secondary | ICD-10-CM | POA: Diagnosis not present

## 2016-05-05 DIAGNOSIS — R2681 Unsteadiness on feet: Secondary | ICD-10-CM | POA: Diagnosis not present

## 2016-05-05 DIAGNOSIS — I10 Essential (primary) hypertension: Secondary | ICD-10-CM | POA: Diagnosis not present

## 2016-05-05 DIAGNOSIS — J449 Chronic obstructive pulmonary disease, unspecified: Secondary | ICD-10-CM | POA: Diagnosis not present

## 2016-05-06 ENCOUNTER — Telehealth: Payer: Self-pay | Admitting: Internal Medicine

## 2016-05-06 DIAGNOSIS — J449 Chronic obstructive pulmonary disease, unspecified: Secondary | ICD-10-CM | POA: Diagnosis not present

## 2016-05-06 DIAGNOSIS — J90 Pleural effusion, not elsewhere classified: Secondary | ICD-10-CM | POA: Diagnosis not present

## 2016-05-06 DIAGNOSIS — R278 Other lack of coordination: Secondary | ICD-10-CM | POA: Diagnosis not present

## 2016-05-06 DIAGNOSIS — I10 Essential (primary) hypertension: Secondary | ICD-10-CM | POA: Diagnosis not present

## 2016-05-06 DIAGNOSIS — R1312 Dysphagia, oropharyngeal phase: Secondary | ICD-10-CM | POA: Diagnosis not present

## 2016-05-06 DIAGNOSIS — M6281 Muscle weakness (generalized): Secondary | ICD-10-CM | POA: Diagnosis not present

## 2016-05-06 DIAGNOSIS — R2681 Unsteadiness on feet: Secondary | ICD-10-CM | POA: Diagnosis not present

## 2016-05-06 NOTE — Telephone Encounter (Signed)
Elnita MaxwellCheryl, I have a 2:30 on the Monday Nov 27th you can schedule him there. Thanks

## 2016-05-06 NOTE — Telephone Encounter (Signed)
° ° °  Pt daughter call to make an appt. Pt was release from skill nursing. Dr Kirtland BouchardK does nto have anything soon so I scheduled him for 05/20/16 and daughter think this is to far away. Does he need to see someone else for this fup

## 2016-05-08 DIAGNOSIS — M6281 Muscle weakness (generalized): Secondary | ICD-10-CM | POA: Diagnosis not present

## 2016-05-08 DIAGNOSIS — R278 Other lack of coordination: Secondary | ICD-10-CM | POA: Diagnosis not present

## 2016-05-08 DIAGNOSIS — R262 Difficulty in walking, not elsewhere classified: Secondary | ICD-10-CM | POA: Diagnosis not present

## 2016-05-08 DIAGNOSIS — J449 Chronic obstructive pulmonary disease, unspecified: Secondary | ICD-10-CM | POA: Diagnosis not present

## 2016-05-10 DIAGNOSIS — R278 Other lack of coordination: Secondary | ICD-10-CM | POA: Diagnosis not present

## 2016-05-10 DIAGNOSIS — R262 Difficulty in walking, not elsewhere classified: Secondary | ICD-10-CM | POA: Diagnosis not present

## 2016-05-10 DIAGNOSIS — M6281 Muscle weakness (generalized): Secondary | ICD-10-CM | POA: Diagnosis not present

## 2016-05-10 DIAGNOSIS — J449 Chronic obstructive pulmonary disease, unspecified: Secondary | ICD-10-CM | POA: Diagnosis not present

## 2016-05-11 DIAGNOSIS — R278 Other lack of coordination: Secondary | ICD-10-CM | POA: Diagnosis not present

## 2016-05-11 DIAGNOSIS — R262 Difficulty in walking, not elsewhere classified: Secondary | ICD-10-CM | POA: Diagnosis not present

## 2016-05-11 DIAGNOSIS — J449 Chronic obstructive pulmonary disease, unspecified: Secondary | ICD-10-CM | POA: Diagnosis not present

## 2016-05-11 DIAGNOSIS — M6281 Muscle weakness (generalized): Secondary | ICD-10-CM | POA: Diagnosis not present

## 2016-05-13 ENCOUNTER — Telehealth: Payer: Self-pay | Admitting: Internal Medicine

## 2016-05-13 DIAGNOSIS — J449 Chronic obstructive pulmonary disease, unspecified: Secondary | ICD-10-CM | POA: Diagnosis not present

## 2016-05-13 DIAGNOSIS — R278 Other lack of coordination: Secondary | ICD-10-CM | POA: Diagnosis not present

## 2016-05-13 DIAGNOSIS — M6281 Muscle weakness (generalized): Secondary | ICD-10-CM | POA: Diagnosis not present

## 2016-05-13 DIAGNOSIS — R262 Difficulty in walking, not elsewhere classified: Secondary | ICD-10-CM | POA: Diagnosis not present

## 2016-05-13 NOTE — Telephone Encounter (Signed)
Please see message, call daughter Misty StanleyLisa [phone].

## 2016-05-13 NOTE — Telephone Encounter (Signed)
Daughter would like a call back today to discuss a couple of issues she would like to address before pt's appointment tomorrow 11/28. She does not want to talk over the pt and he will try and be in control. Please call back

## 2016-05-14 ENCOUNTER — Ambulatory Visit (INDEPENDENT_AMBULATORY_CARE_PROVIDER_SITE_OTHER): Payer: Medicare Other | Admitting: Internal Medicine

## 2016-05-14 ENCOUNTER — Encounter: Payer: Self-pay | Admitting: Internal Medicine

## 2016-05-14 VITALS — BP 120/70 | HR 70 | Temp 98.3°F | Resp 20 | Ht 68.0 in | Wt 164.0 lb

## 2016-05-14 DIAGNOSIS — I481 Persistent atrial fibrillation: Secondary | ICD-10-CM

## 2016-05-14 DIAGNOSIS — R609 Edema, unspecified: Secondary | ICD-10-CM

## 2016-05-14 DIAGNOSIS — E785 Hyperlipidemia, unspecified: Secondary | ICD-10-CM

## 2016-05-14 DIAGNOSIS — I10 Essential (primary) hypertension: Secondary | ICD-10-CM | POA: Diagnosis not present

## 2016-05-14 DIAGNOSIS — I4819 Other persistent atrial fibrillation: Secondary | ICD-10-CM

## 2016-05-14 DIAGNOSIS — E0842 Diabetes mellitus due to underlying condition with diabetic polyneuropathy: Secondary | ICD-10-CM | POA: Diagnosis not present

## 2016-05-14 MED ORDER — FUROSEMIDE 40 MG PO TABS: ORAL_TABLET | ORAL | 3 refills | Status: DC

## 2016-05-14 MED ORDER — GLIPIZIDE 5 MG PO TABS: 5.0000 mg | ORAL_TABLET | Freq: Every day | ORAL | 1 refills | Status: DC

## 2016-05-14 MED ORDER — METFORMIN HCL ER 500 MG PO TB24: 1000.0000 mg | ORAL_TABLET | Freq: Every day | ORAL | 4 refills | Status: DC

## 2016-05-14 NOTE — Progress Notes (Signed)
Subjective:    Patient ID: Kyle CairoRobert P Giraud, male    DOB: 1925/08/09, 82 y.o.   MRN: [id number]  HPI  82 year old patient who is seen today in follow-up. The patient has been treated for community acquired pneumonia associated with left-sided effusion.  He required transfer to a rehabilitation facility until he improved.  He has been seen by pulmonary in follow-up with resolution of the effusion.  He has completed antibiotic therapy He remains weak but is improving.  He has been discharged to home for proximally one week and continues to receive OT and PT.  He also has home care assistance daily. He assist with the care of his wife who is disabled and cognitively impaired  The patient describes frequent bowel movements with occasional incontinence.  He has been on furosemide due to worsening peripheral edema that has improved.  He is having difficulty with polyuria. He has type 2 diabetes controlled on oral medication.  He has COPD which has been stable. He has atrial fibrillation and remains on chronic anticoagulation.  Past Medical History:  Diagnosis Date  . Benign prostatic hypertrophy   . Diabetes mellitus   . Elevated PSA   . Glaucoma   . Hyperlipidemia   . Hypertension   . Intermittent complete heart block (HCC)   . Pacemaker   . PAF (paroxysmal atrial fibrillation) (HCC)    Detected on pacemaker; the duration 44 seconds  . Trifascicular block      Social History   Social History  . Marital status: Married    Spouse name: N/A  . Number of children: 4  . Years of education: N/A   Occupational History  . Not on file.   Social History Main Topics  . Smoking status: Former Smoker    Packs/day: 0.50    Years: 71.00    Types: Cigarettes    Quit date: 04/08/2016  . Smokeless tobacco: Never Used  . Alcohol use 1.8 oz/week    1 Glasses of wine, 2 Shots of liquor per week     Comment: bourbon, 2 drinks a day  . Drug use: No  . Sexual activity: Not on file   Other  Topics Concern  . Not on file   Social History Narrative   Lives at home with wife   [facility]   4 children, oldest (daughter) helps the most Misty Stanley(Lisa, who is the primary contact person)   OCCUPATION: retired, Writerbusiness machines/computers    Past Surgical History:  Procedure Laterality Date  . CATARACT EXTRACTION, BILATERAL    . PERMANENT PACEMAKER INSERTION N/A 08/24/2012   Procedure: PERMANENT PACEMAKER INSERTION;  Surgeon: Duke SalviaSteven C Klein, MD;  [facility];  Service: Cardiovascular;  Laterality: N/A;  . TONSILLECTOMY      Family History  Problem Relation Age of Onset  . Heart disease Mother     atrial fib  . Heart disease Father 5191    MI  . Cancer Father     prostate    No Known Allergies  Current Outpatient Prescriptions on File Prior to Visit  Medication Sig Dispense Refill  . albuterol (PROVENTIL,VENTOLIN) 90 MCG/ACT inhaler Inhale 2 puffs into the lungs 2 (two) times daily.      Marland Kitchen. apixaban (ELIQUIS) 2.5 MG TABS tablet Take 1 tablet (2.5 mg total) by mouth 2 (two) times daily. 60 tablet 1  . doxazosin (CARDURA) 4 MG tablet TAKE 1 TABLET EACH DAY. 90 tablet 1  . glucose blood (  PRECISION XTRA TEST STRIPS) test strip 1 each by Other route daily as needed for other. Use as instructed 100 each 3  . latanoprost (XALATAN) 0.005 % ophthalmic solution 1 drop at bedtime.    Marland Kitchen. lisinopril (PRINIVIL,ZESTRIL) 40 MG tablet TAKE 1 TABLET ONCE DAILY. 90 tablet 3  . metoprolol succinate (TOPROL-XL) 50 MG 24 hr tablet Take 1/2 tablet by mouth daily (25mg ) 30 tablet 6  . simvastatin (ZOCOR) 20 MG tablet TAKE ONE TABLET AT BEDTIME. 90 tablet 3   No current facility-administered medications on file prior to visit.     BP 120/70 (BP Location: Left Arm, Patient Position: Sitting, Cuff Size: Normal)   Pulse 70   Temp 98.3 F (36.8 C) (Oral)   Resp 20   Ht 5\' 8"  (1.727 m)   Wt 164 lb (74.4 kg)   SpO2 96%   BMI 24.94 kg/m      Review of Systems    Constitutional: Positive for activity change, appetite change and fatigue. Negative for chills and fever.  HENT: Negative for congestion, dental problem, ear pain, hearing loss, sore throat, tinnitus, trouble swallowing and voice change.   Eyes: Negative for pain, discharge and visual disturbance.  Respiratory: Negative for cough, chest tightness, wheezing and stridor.   Cardiovascular: Negative for chest pain, palpitations and leg swelling.  Gastrointestinal: Positive for diarrhea. Negative for abdominal distention, abdominal pain, blood in stool, constipation, nausea and vomiting.  Genitourinary: Positive for frequency. Negative for difficulty urinating, discharge, flank pain, genital sores, hematuria and urgency.  Musculoskeletal: Negative for arthralgias, back pain, gait problem, joint swelling, myalgias and neck stiffness.  Skin: Negative for rash.  Neurological: Positive for weakness. Negative for dizziness, syncope, speech difficulty, numbness and headaches.  Hematological: Negative for adenopathy. Does not bruise/bleed easily.  Psychiatric/Behavioral: Negative for behavioral problems and dysphoric mood. The patient is not nervous/anxious.        Objective:   Physical Exam  Constitutional: He is oriented to person, place, and time. He appears well-developed.  HENT:  Head: Normocephalic.  Right Ear: External ear normal.  Left Ear: External ear normal.  Eyes: Conjunctivae and EOM are normal.  Neck: Normal range of motion.  Cardiovascular: Normal rate and normal heart sounds.   Pulmonary/Chest: Effort normal and breath sounds normal. No respiratory distress. He has no wheezes.  Lungs essentially clear Dullness and decreased breath sounds left lower lung.  Resolved  Abdominal: Bowel sounds are normal.  Musculoskeletal: Normal range of motion. He exhibits edema. He exhibits no tenderness.  Trace ankle edema  Neurological: He is alert and oriented to person, place, and time.   Psychiatric: He has a normal mood and affect. His behavior is normal.          Assessment & Plan:   Status post required pneumonia, kidney by parapneumonic effusion.  Resolved COPD Deconditioning.  Will continue OT and PT Diabetes mellitus.  Patient has had a number of blood sugars less than 100.  Will decrease glipizide to 5 mg daily.  Due to frequent stools associated with incontinence.  We'll also decrease metformin to 1000 mg daily Pedal edema.  This has largely  resolved.  Will change furosemide to 20 mg when necessary only  Rogelia BogaKWIATKOWSKI,Carleton [place] FRANK

## 2016-05-14 NOTE — Patient Instructions (Signed)
Limit your sodium (Salt) intake  Continue physical therapy  Return in 3 months for follow-up   

## 2016-05-15 DIAGNOSIS — R278 Other lack of coordination: Secondary | ICD-10-CM | POA: Diagnosis not present

## 2016-05-15 DIAGNOSIS — R262 Difficulty in walking, not elsewhere classified: Secondary | ICD-10-CM | POA: Diagnosis not present

## 2016-05-15 DIAGNOSIS — J449 Chronic obstructive pulmonary disease, unspecified: Secondary | ICD-10-CM | POA: Diagnosis not present

## 2016-05-15 DIAGNOSIS — M6281 Muscle weakness (generalized): Secondary | ICD-10-CM | POA: Diagnosis not present

## 2016-05-16 DIAGNOSIS — J449 Chronic obstructive pulmonary disease, unspecified: Secondary | ICD-10-CM | POA: Diagnosis not present

## 2016-05-16 DIAGNOSIS — M6281 Muscle weakness (generalized): Secondary | ICD-10-CM | POA: Diagnosis not present

## 2016-05-16 DIAGNOSIS — R278 Other lack of coordination: Secondary | ICD-10-CM | POA: Diagnosis not present

## 2016-05-16 DIAGNOSIS — R262 Difficulty in walking, not elsewhere classified: Secondary | ICD-10-CM | POA: Diagnosis not present

## 2016-05-17 DIAGNOSIS — M6281 Muscle weakness (generalized): Secondary | ICD-10-CM | POA: Diagnosis not present

## 2016-05-17 DIAGNOSIS — J449 Chronic obstructive pulmonary disease, unspecified: Secondary | ICD-10-CM | POA: Diagnosis not present

## 2016-05-17 DIAGNOSIS — R262 Difficulty in walking, not elsewhere classified: Secondary | ICD-10-CM | POA: Diagnosis not present

## 2016-05-17 DIAGNOSIS — R278 Other lack of coordination: Secondary | ICD-10-CM | POA: Diagnosis not present

## 2016-05-20 ENCOUNTER — Ambulatory Visit: Payer: Medicare Other | Admitting: Internal Medicine

## 2016-05-20 DIAGNOSIS — R262 Difficulty in walking, not elsewhere classified: Secondary | ICD-10-CM | POA: Diagnosis not present

## 2016-05-20 DIAGNOSIS — R278 Other lack of coordination: Secondary | ICD-10-CM | POA: Diagnosis not present

## 2016-05-20 DIAGNOSIS — M6281 Muscle weakness (generalized): Secondary | ICD-10-CM | POA: Diagnosis not present

## 2016-05-20 DIAGNOSIS — J449 Chronic obstructive pulmonary disease, unspecified: Secondary | ICD-10-CM | POA: Diagnosis not present

## 2016-05-21 DIAGNOSIS — M6281 Muscle weakness (generalized): Secondary | ICD-10-CM | POA: Diagnosis not present

## 2016-05-21 DIAGNOSIS — R262 Difficulty in walking, not elsewhere classified: Secondary | ICD-10-CM | POA: Diagnosis not present

## 2016-05-21 DIAGNOSIS — R278 Other lack of coordination: Secondary | ICD-10-CM | POA: Diagnosis not present

## 2016-05-21 DIAGNOSIS — J449 Chronic obstructive pulmonary disease, unspecified: Secondary | ICD-10-CM | POA: Diagnosis not present

## 2016-05-23 DIAGNOSIS — R262 Difficulty in walking, not elsewhere classified: Secondary | ICD-10-CM | POA: Diagnosis not present

## 2016-05-23 DIAGNOSIS — R278 Other lack of coordination: Secondary | ICD-10-CM | POA: Diagnosis not present

## 2016-05-23 DIAGNOSIS — M6281 Muscle weakness (generalized): Secondary | ICD-10-CM | POA: Diagnosis not present

## 2016-05-23 DIAGNOSIS — J449 Chronic obstructive pulmonary disease, unspecified: Secondary | ICD-10-CM | POA: Diagnosis not present

## 2016-06-07 ENCOUNTER — Telehealth: Payer: Self-pay | Admitting: Internal Medicine

## 2016-06-07 NOTE — Telephone Encounter (Signed)
Patient Name: Leonides GrillsROBERT Banos  DOB: 1926-03-30    Initial Comment caller states her father is having weakness in his legs and fell this morning   Nurse Assessment  Nurse: Stefano GaulStringer, RN, Dwana CurdVera Date/Time Lamount Cohen(Eastern Time): 06/07/2016 11:39:26 AM  Confirm and document reason for call. If symptomatic, describe symptoms. ---Caller states father fell this am. He was weak. No injury. A nurse and 2 CNAs who came and got him up. He is not confused. he is talking fine. He is moving arms and legs fine. No pain. He is eating right now. BP was elevated this am. Does not know what his BP was.  Does the patient have any new or worsening symptoms? ---Yes  Will a triage be completed? ---Yes  Related visit to physician within the last 2 weeks? ---No  Does the PT have any chronic conditions? (i.e. diabetes, asthma, etc.) ---Yes  List chronic conditions. ---pacemaker; HTN  Is this a behavioral health or substance abuse call? ---No     Guidelines    Guideline Title Affirmed Question Affirmed Notes  Weakness (Generalized) and Fatigue Taking a medicine that could cause weakness (e.g., blood pressure medications, diuretics)    Final Disposition User   See Physician within 24 Hours YeguadaStringer, RN, Vera    Comments  No appts available at [facility] today. Please call daughter back if appt becomes available. [facility] if no appt today.   Referrals  [facility]   Disagree/Comply: Comply

## 2016-06-07 NOTE — Telephone Encounter (Signed)
Called and spoke with daughter, daughter states she plans on going to the [facility] tomorrow. She states that she okay at this time and nothing further needed at this time. Would like to receive a call if there are any cancellations this afternoon.

## 2016-06-12 ENCOUNTER — Emergency Department (HOSPITAL_COMMUNITY): Payer: Medicare Other

## 2016-06-12 ENCOUNTER — Encounter (HOSPITAL_COMMUNITY): Payer: Self-pay | Admitting: Emergency Medicine

## 2016-06-12 ENCOUNTER — Emergency Department (HOSPITAL_COMMUNITY)
Admission: EM | Admit: 2016-06-12 | Discharge: 2016-06-13 | Disposition: A | Discharge status: Home / Self Care | Payer: Medicare Other | Attending: Emergency Medicine | Admitting: Emergency Medicine

## 2016-06-12 ENCOUNTER — Telehealth: Payer: Self-pay | Admitting: Internal Medicine

## 2016-06-12 DIAGNOSIS — R404 Transient alteration of awareness: Secondary | ICD-10-CM | POA: Diagnosis not present

## 2016-06-12 DIAGNOSIS — I10 Essential (primary) hypertension: Secondary | ICD-10-CM | POA: Insufficient documentation

## 2016-06-12 DIAGNOSIS — Z7901 Long term (current) use of anticoagulants: Secondary | ICD-10-CM | POA: Insufficient documentation

## 2016-06-12 DIAGNOSIS — R930 Abnormal findings on diagnostic imaging of skull and head, not elsewhere classified: Secondary | ICD-10-CM | POA: Insufficient documentation

## 2016-06-12 DIAGNOSIS — Z87891 Personal history of nicotine dependence: Secondary | ICD-10-CM | POA: Insufficient documentation

## 2016-06-12 DIAGNOSIS — J189 Pneumonia, unspecified organism: Secondary | ICD-10-CM | POA: Diagnosis not present

## 2016-06-12 DIAGNOSIS — J449 Chronic obstructive pulmonary disease, unspecified: Secondary | ICD-10-CM | POA: Diagnosis not present

## 2016-06-12 DIAGNOSIS — Z7984 Long term (current) use of oral hypoglycemic drugs: Secondary | ICD-10-CM | POA: Insufficient documentation

## 2016-06-12 DIAGNOSIS — R627 Adult failure to thrive: Secondary | ICD-10-CM | POA: Insufficient documentation

## 2016-06-12 DIAGNOSIS — R531 Weakness: Secondary | ICD-10-CM | POA: Diagnosis not present

## 2016-06-12 DIAGNOSIS — Z950 Presence of cardiac pacemaker: Secondary | ICD-10-CM | POA: Insufficient documentation

## 2016-06-12 DIAGNOSIS — R410 Disorientation, unspecified: Secondary | ICD-10-CM | POA: Diagnosis not present

## 2016-06-12 DIAGNOSIS — E1142 Type 2 diabetes mellitus with diabetic polyneuropathy: Secondary | ICD-10-CM | POA: Diagnosis not present

## 2016-06-12 LAB — URINALYSIS, ROUTINE W REFLEX MICROSCOPIC
BACTERIA UA: NONE SEEN
BILIRUBIN URINE: NEGATIVE
Glucose, UA: NEGATIVE mg/dL
Hgb urine dipstick: NEGATIVE
KETONES UR: NEGATIVE mg/dL
Leukocytes, UA: NEGATIVE
Nitrite: NEGATIVE
Protein, ur: 100 mg/dL — AB
RBC / HPF: NONE SEEN RBC/hpf (ref 0–5)
Specific Gravity, Urine: 1.027 (ref 1.005–1.030)
pH: 5 (ref 5.0–8.0)

## 2016-06-12 LAB — COMPREHENSIVE METABOLIC PANEL
ALT: 13 U/L — AB (ref 17–63)
AST: 16 U/L (ref 15–41)
Albumin: 3.7 g/dL (ref 3.5–5.0)
Alkaline Phosphatase: 63 U/L (ref 38–126)
Anion gap: 10 (ref 5–15)
BUN: 20 mg/dL (ref 6–20)
CHLORIDE: 104 mmol/L (ref 101–111)
CO2: 29 mmol/L (ref 22–32)
CREATININE: 0.71 mg/dL (ref 0.61–1.24)
Calcium: 9.6 mg/dL (ref 8.9–10.3)
GFR calc non Af Amer: >60 mL/min (ref >60)
Glucose, Bld: 123 mg/dL — ABNORMAL HIGH (ref 65–99)
POTASSIUM: 3.6 mmol/L (ref 3.5–5.1)
SODIUM: 143 mmol/L (ref 135–145)
Total Bilirubin: 0.8 mg/dL (ref 0.3–1.2)
Total Protein: 6.7 g/dL (ref 6.5–8.1)

## 2016-06-12 LAB — CBC WITH DIFFERENTIAL/PLATELET
BASOS ABS: 0 10*3/uL (ref 0.0–0.1)
Basophils Relative: 0 %
EOS ABS: 0.1 10*3/uL (ref 0.0–0.7)
EOS PCT: 1 %
HCT: 36 % — ABNORMAL LOW (ref 39.0–52.0)
Hemoglobin: 11.5 g/dL — ABNORMAL LOW (ref 13.0–17.0)
LYMPHS ABS: 2.4 10*3/uL (ref 0.7–4.0)
Lymphocytes Relative: 25 %
MCH: 27.4 pg (ref 26.0–34.0)
MCHC: 31.9 g/dL (ref 30.0–36.0)
MCV: 85.7 fL (ref 78.0–100.0)
MONO ABS: 0.9 10*3/uL (ref 0.1–1.0)
Monocytes Relative: 10 %
Neutro Abs: 6 10*3/uL (ref 1.7–7.7)
Neutrophils Relative %: 64 %
PLATELETS: 243 10*3/uL (ref 150–400)
RBC: 4.2 MIL/uL — AB (ref 4.22–5.81)
RDW: 19.9 % — AB (ref 11.5–15.5)
WBC: 9.5 10*3/uL (ref 4.0–10.5)

## 2016-06-12 LAB — CBG MONITORING, ED: GLUCOSE-CAPILLARY: 114 mg/dL — AB (ref 65–99)

## 2016-06-12 NOTE — ED Provider Notes (Signed)
[facility]-EMERGENCY DEPT Provider Note   CSN: [id number] Arrival date & time: 06/12/16  1446     History   Chief Complaint Chief Complaint  Patient presents with  . Failure To Thrive    HPI Kyle [name] is a 82 y.o. male.  HPI  82 year old male brought in by family for possible UTI. Patient has wet the bed a couple times over the last couple days. He has not complained of dysuria. He has also had 2 falls in the last 1 week. Both times were unwitnessed but there was no reported syncope or obvious injury. He was holding his right lower ribs a couple days ago but reports no chest pain or rib pain. The patient has also been disoriented on and off for several weeks since getting out of a rehabilitation facility after having pneumonia. He has also been having decreased oral intake over these last several weeks, the son states he's always been a picky eater and sometimes sleeps through meals. The patient currently has no complaints. There've been no reported fevers at home.  Past Medical History:  Diagnosis Date  . Benign prostatic hypertrophy   . Diabetes mellitus   . Elevated PSA   . Glaucoma   . Hyperlipidemia   . Hypertension   . Intermittent complete heart block (HCC)   . Pacemaker   . PAF (paroxysmal atrial fibrillation) (HCC)    Detected on pacemaker; the duration 44 seconds  . Trifascicular block     Patient Active Problem List   Diagnosis Date Noted  . Depression 10/19/2015  . Atrial fibrillation (HCC) 11/25/2012  . Syncope 08/17/2012  . Trifascicular block-RBBB+LAFB 08/17/2012  . Edema 08/17/2012  . Obstructive sleep apnea 08/17/2012  . PROSTATE SPECIFIC ANTIGEN, ELEVATED 01/10/2009  . COPD 10/11/2008  . BENIGN PROSTATIC HYPERTROPHY 01/13/2008  . Dyslipidemia 01/07/2007  . Diabetes mellitus due to underlying condition with diabetic polyneuropathy (HCC) 12/24/2006  . GLAUCOMA 12/24/2006  . Essential hypertension 12/24/2006    Past Surgical History:  Procedure  Laterality Date  . CATARACT EXTRACTION, BILATERAL    . PERMANENT PACEMAKER INSERTION N/A 08/24/2012   Procedure: PERMANENT PACEMAKER INSERTION;  Surgeon: Duke Salvia, MD;  Location: [facility] CATH LAB;  Service: Cardiovascular;  Laterality: N/A;  . TONSILLECTOMY         Home Medications    Prior to Admission medications   Medication Sig Start Date End Date Taking? Authorizing Provider  albuterol (PROVENTIL) (2.5 MG/3ML) 0.083% nebulizer solution Take 2.5 mg by nebulization 3 (three) times daily.  04/25/16  Yes Historical Provider, MD  albuterol (PROVENTIL,VENTOLIN) 90 MCG/ACT inhaler Inhale 2 puffs into the lungs 2 (two) times daily.     Yes Historical Provider, MD  apixaban (ELIQUIS) 2.5 MG TABS tablet Take 1 tablet (2.5 mg total) by mouth 2 (two) times daily. 11/23/15  Yes Newman Nip, NP  ASMANEX 30 METERED DOSES 220 MCG/INH inhaler Inhale 2 puffs into the lungs daily.  04/10/16  Yes Historical Provider, MD  doxazosin (CARDURA) 4 MG tablet TAKE 1 TABLET EACH DAY. 03/07/16  Yes Gordy Savers, MD  escitalopram (LEXAPRO) 5 MG tablet Take 5 mg by mouth daily.   Yes Historical Provider, MD  furosemide (LASIX) 40 MG tablet 1 half tablet daily as needed for fluid control 05/14/16  Yes Gordy Savers, MD  glipiZIDE (GLUCOTROL) 5 MG tablet Take 1 tablet (5 mg total) by mouth daily before breakfast. Patient taking differently: Take 5 mg by mouth 2 (two) times daily before a  meal.  05/14/16  Yes Gordy SaversPeter F Kwiatkowski, MD  latanoprost (XALATAN) 0.005 % ophthalmic solution 1 drop at bedtime.   Yes Historical Provider, MD  lisinopril (PRINIVIL,ZESTRIL) 40 MG tablet TAKE 1 TABLET ONCE DAILY. 08/17/15  Yes Gordy SaversPeter F Kwiatkowski, MD  metFORMIN (GLUCOPHAGE-XR) 500 MG 24 hr tablet Take 2 tablets (1,000 mg total) by mouth daily with breakfast. Patient taking differently: Take 2,000 mg by mouth daily with breakfast.  05/14/16  Yes Gordy SaversPeter F Kwiatkowski, MD  metoprolol succinate (TOPROL-XL) 50 MG 24 hr tablet  Take 1/2 tablet by mouth daily (25mg ) 11/02/15  Yes Newman Niponna C Carroll, NP  simvastatin (ZOCOR) 20 MG tablet TAKE ONE TABLET AT BEDTIME. 08/17/15  Yes Gordy SaversPeter F Kwiatkowski, MD  glucose blood (PRECISION XTRA TEST STRIPS) test strip 1 each by Other route daily as needed for other. Use as instructed 06/24/13   Gordy SaversPeter F Kwiatkowski, MD    Family History Family History  Problem Relation Age of Onset  . Heart disease Mother     atrial fib  . Heart disease Father [id number]    MI  . Cancer Father     prostate    Social History Social History  Substance Use Topics  . Smoking status: Former Smoker    Packs/day: 0.50    Years: 71.00    Types: Cigarettes    Quit date: 04/08/2016  . Smokeless tobacco: Never Used  . Alcohol use 1.8 oz/week    1 Glasses of wine, 2 Shots of liquor per week     Comment: bourbon, 2 drinks a day     Allergies   Patient has no known allergies.   Review of Systems Review of Systems  Constitutional: Negative for fever.  Respiratory: Negative for cough and shortness of breath.   Cardiovascular: Negative for chest pain.  Gastrointestinal: Negative for abdominal pain.  Genitourinary: Negative for dysuria.       Incontinence  Neurological: Negative for headaches.  Psychiatric/Behavioral: Positive for confusion.  All other systems reviewed and are negative.    Physical Exam Updated Vital Signs BP 156/71   Pulse 70   Temp 97 F (36.1 C) (Axillary)   Resp 16   SpO2 93%   Physical Exam  Constitutional: He is oriented to person, place, and time. He appears well-developed and well-nourished.  HENT:  Head: Normocephalic and atraumatic.  Right Ear: External ear normal.  Left Ear: External ear normal.  Nose: Nose normal.  Eyes: EOM are normal. Pupils are equal, round, and reactive to light. Right eye exhibits no discharge. Left eye exhibits no discharge.  Neck: Neck supple.  Cardiovascular: Normal rate, regular rhythm and normal heart sounds.   Pulmonary/Chest:  Effort normal and breath sounds normal. He exhibits no tenderness.  Abdominal: Soft. There is no tenderness.  Musculoskeletal: He exhibits no edema.  Neurological: He is alert and oriented to person, place, and time.  CN 3-12 grossly intact. 5/5 strength in all 4 extremities. Grossly normal sensation. Normal finger to nose.   Skin: Skin is warm and dry.  Nursing note and vitals reviewed.    ED Treatments / Results  Labs (all labs ordered are listed, but only abnormal results are displayed) Labs Reviewed  URINALYSIS, ROUTINE W REFLEX MICROSCOPIC - Abnormal; Notable for the following:       Result Value   Color, Urine AMBER (*)    APPearance HAZY (*)    Protein, ur 100 (*)    Squamous Epithelial / LPF 0-5 (*)    All  other components within normal limits  CBC WITH DIFFERENTIAL/PLATELET - Abnormal; Notable for the following:    RBC 4.20 (*)    Hemoglobin 11.5 (*)    HCT 36.0 (*)    RDW 19.9 (*)    All other components within normal limits  COMPREHENSIVE METABOLIC PANEL - Abnormal; Notable for the following:    Glucose, Bld 123 (*)    ALT 13 (*)    All other components within normal limits  CBG MONITORING, ED - Abnormal; Notable for the following:    Glucose-Capillary 114 (*)    All other components within normal limits    EKG  EKG Interpretation  Date/Time:  Thursday June 13 2016 00:05:33 EST Ventricular Rate:  69 PR Interval:    QRS Duration: 186 QT Interval:  482 QTC Calculation: 517 R Axis:   -85 Text Interpretation:  Ventricular-paced rhythm No further analysis attempted due to paced rhythm no significant change since Oct 2017 Confirmed by Criss Alvine MD, Dondre Catalfamo [phone]) on 06/13/2016 12:44:18 AM       Radiology Dg Chest 2 View  Result Date: 06/13/2016 CLINICAL DATA:  Failure to thrive. Status post unwitnessed fall. Upper back pain. Initial encounter. EXAM: CHEST  2 VIEW COMPARISON:  Chest radiograph performed 04/29/2016 FINDINGS: The lungs are well-aerated.  Patchy bilateral airspace opacification is noted. This may reflect pulmonary edema or pneumonia. Small bilateral pleural effusions are seen. No pneumothorax is identified. The heart is mildly enlarged. A pacemaker is noted at the left chest wall, with leads ending at the right atrium and right ventricle. No acute osseous abnormalities are seen. IMPRESSION: Patchy bilateral airspace opacification noted. This may reflect pulmonary edema or pneumonia. Small bilateral pleural effusions. Mild cardiomegaly. Electronically Signed   By: Roanna Raider M.D.   On: 06/13/2016 00:08   Ct Head Wo Contrast  Result Date: 06/13/2016 CLINICAL DATA:  Acute onset of disorientation.  Initial encounter. EXAM: CT HEAD WITHOUT CONTRAST TECHNIQUE: Contiguous axial images were obtained from the base of the skull through the vertex without intravenous contrast. COMPARISON:  None. FINDINGS: Brain: No evidence of acute infarction, hemorrhage, hydrocephalus, extra-axial collection or mass lesion/mass effect. Prominence of the ventricles and sulci reflects mild to moderate cortical volume loss. Mild cerebellar atrophy is noted. Scattered periventricular and subcortical white matter change likely reflects small vessel ischemic microangiopathy. Chronic lacunar infarcts are seen at the left basal ganglia. Chronic lacunar infarcts are seen at the thalami bilaterally, and at the right side of the pons. The fourth ventricle is within normal limits. The cerebral hemispheres demonstrate grossly normal gray-white differentiation. No mass effect or midline shift is seen. Vascular: No hyperdense vessel or unexpected calcification. Skull: There is no evidence of fracture; visualized osseous structures are unremarkable in appearance. Sinuses/Orbits: The orbits are within normal limits. The paranasal sinuses and mastoid air cells are well-aerated. Other: No significant soft tissue abnormalities are seen. IMPRESSION: 1. No acute intracranial pathology  seen on CT. 2. Mild to moderate cortical volume loss and scattered small vessel ischemic microangiopathy. 3. Chronic lacunar infarcts at the left basal ganglia, and at the thalami bilaterally. Chronic lacunar infarct at the right side of the pons. Electronically Signed   By: Roanna Raider M.D.   On: 06/13/2016 00:07    Procedures Procedures (including critical care time)  Medications Ordered in ED Medications  apixaban (ELIQUIS) tablet 2.5 mg (not administered)  albuterol (PROVENTIL) (2.5 MG/3ML) 0.083% nebulizer solution 2.5 mg (not administered)  doxazosin (CARDURA) tablet 4 mg (not administered)  escitalopram (  LEXAPRO) tablet 5 mg (not administered)  glipiZIDE (GLUCOTROL) tablet 5 mg (not administered)  latanoprost (XALATAN) 0.005 % ophthalmic solution 1 drop (not administered)  lisinopril (PRINIVIL,ZESTRIL) tablet 40 mg (not administered)  metFORMIN (GLUCOPHAGE-XR) 24 hr tablet 2,000 mg (not administered)  metoprolol succinate (TOPROL-XL) 24 hr tablet 25 mg (not administered)  simvastatin (ZOCOR) tablet 20 mg (not administered)  doxycycline (VIBRA-TABS) tablet 100 mg (100 mg Oral Given 06/13/16 0100)     Initial Impression / Assessment and Plan / ED Course  I have reviewed the triage vital signs and the nursing notes.  Pertinent labs & imaging results that were available during my care of the patient were reviewed by me and considered in my medical decision making (see chart for details).  Clinical Course as of Jun 13 709  Wed Jun 12, 2016  2337 Given the new falls and incontinence, will get CT head. Labs. Urine negative. Exam benign, currently A&O x 3 with benign neuro exam. No obvious injuries from the 2 falls.  [SG]  Thu Jun 13, 2016  0050 X-ray shows possible pneumonia. He denies any cough, has normal white blood cell count, no fevers, and no dyspnea or hypoxia. Given the significant findings, we'll cover with doxycycline but I'm not sure this is causing his symptoms.  [SG]    0050 Daughter now telling me that patient has also expressed suicidal thoughts and wondering to die. He has asked that someone help him assist in this. He has not had any actual attempts are made any specific gestures that would indicate he would do it himself but family has become concerned. Consult TTS.  [SG]    Clinical Course User Index [SG] Pricilla LovelessScott Mikias Lanz, MD    Patient is overall well appearing. TTS has been consulted and psychiatry will see in the a.m. If discharged, will need antibiotics (started on doxycycline) for this possible pneumonia. At this point no indication for medical admission. He is awake and alert and oriented. Care to oncoming physician  Final Clinical Impressions(s) / ED Diagnoses   Final diagnoses:  Community acquired pneumonia, unspecified laterality    New Prescriptions New Prescriptions   No medications on file     Pricilla LovelessScott Song Myre, MD 06/13/16 [phone]

## 2016-06-12 NOTE — ED Notes (Addendum)
Writer reassesed pt's vitals, and offered pt food, pt denied and stated that, "I am ok, and willing to wait."  Writer offered pt if he wanted to use the bathroom, Clinical research associatewriter assisted pt to the bathroom.  Writer informed family member's there's still a wait for him, and if there's anything she or any of the staff members can do to help, let them know.

## 2016-06-12 NOTE — Telephone Encounter (Signed)
[facility] - Client TELEPHONE ADVICE RECORD [facility]  Patient Name: Kyle [name]  DOB: 07/20/1925    Initial Comment Caller states, her father had another fall and he has lost the will to live and had a low blood sugar. Anxiety rx. increased a little. He says he is ready to go. BS 77 last night. Will not eat. Verified    Nurse Assessment  Nurse: Dorthula RuePatten, RN, Enrique SackKendra Date/Time (Eastern Time): 06/12/2016 11:05:05 AM  Confirm and document reason for call. If symptomatic, describe symptoms. ---He had a fall last night per daughter. She states his blood sugar was down to 66 and then it went up to 77. Daughter states he will not eat or take his medications. D[facility] and she is there with him and he will not do anything.  Does the patient have any new or worsening symptoms? ---Yes  Will a triage be completed? ---Yes  Related visit to physician within the last 2 weeks? ---No  Does the PT have any chronic conditions? (i.e. diabetes, asthma, etc.) ---Yes  List chronic conditions. ---Pacemaker, Diabetes, Pneumonia,  Is this a behavioral health or substance abuse call? ---No     Guidelines    Guideline Title Affirmed Question Affirmed Notes  Weakness (Generalized) and Fatigue Difficulty breathing    Final Disposition User   Go to ED Now Dorthula RuePatten, RN, Enrique SackKendra    Comments  Daughter states she is going to try and get him going this morning and if he will not she will take him to ED. She is just unsure about what to do at this time.   Referrals  GO TO FACILITY UNDECIDED   Disagree/Comply: Comply

## 2016-06-12 NOTE — ED Triage Notes (Signed)
Per EMS, patient from home, per daughter patient has been "disoirented for a couple days." Per EMS patient A&Ox4. Daughter states patient has not been "making decisions on what he wants to eat and sleeping more then normal." Patient states he has "no desire to take steps to stay alive." Patient reports he is not SI and does not have thoughts of hurting himself. Patient denies any complaints or pain.

## 2016-06-12 NOTE — Telephone Encounter (Signed)
Spoke with pt's daughter and she states that pt did eat lunch, however he seems disoriented and confused. She states that this is not normal for him. The SNF nurse is checking his vitals now. Daughter states that if she takes him to ED she wants him admitted. Advised that this is up to the ED physician based on his findings. She voiced understanding. She will call office if she needs anything further. Advised her that I will monitor pt's chart for ED/admission.

## 2016-06-12 NOTE — ED Notes (Signed)
EKG delayed, pt transported to CT/

## 2016-06-12 NOTE — Telephone Encounter (Signed)
[facility]

## 2016-06-13 DIAGNOSIS — J189 Pneumonia, unspecified organism: Secondary | ICD-10-CM

## 2016-06-13 LAB — TSH: TSH: 0.802 u[IU]/mL (ref 0.350–4.500)

## 2016-06-13 MED ORDER — DOXYCYCLINE HYCLATE 100 MG PO CAPS: 100.0000 mg | ORAL_CAPSULE | Freq: Two times a day (BID) | ORAL | 0 refills | Status: DC

## 2016-06-13 MED ORDER — DOXAZOSIN MESYLATE 4 MG PO TABS
4.0000 mg | ORAL_TABLET | Freq: Every day | ORAL | Status: DC
  Administered 2016-06-13: 4 mg via ORAL
  Filled 2016-06-13: qty 1

## 2016-06-13 MED ORDER — APIXABAN 2.5 MG PO TABS
2.5000 mg | ORAL_TABLET | Freq: Two times a day (BID) | ORAL | Status: DC
  Administered 2016-06-13: 2.5 mg via ORAL
  Filled 2016-06-13: qty 1

## 2016-06-13 MED ORDER — LATANOPROST 0.005 % OP SOLN
1.0000 [drp] | Freq: Every day | OPHTHALMIC | Status: DC
  Filled 2016-06-13: qty 2.5

## 2016-06-13 MED ORDER — DOXYCYCLINE HYCLATE 100 MG PO TABS
100.0000 mg | ORAL_TABLET | Freq: Once | ORAL | Status: AC
  Administered 2016-06-13: 100 mg via ORAL
  Filled 2016-06-13: qty 1

## 2016-06-13 MED ORDER — SIMVASTATIN 20 MG PO TABS: 20.0000 mg | ORAL_TABLET | Freq: Every day | ORAL | Status: DC

## 2016-06-13 MED ORDER — DOXYCYCLINE HYCLATE 100 MG PO TABS
100.0000 mg | ORAL_TABLET | Freq: Every day | ORAL | Status: DC
  Administered 2016-06-13: 100 mg via ORAL
  Filled 2016-06-13: qty 1

## 2016-06-13 MED ORDER — ALBUTEROL SULFATE (2.5 MG/3ML) 0.083% IN NEBU
2.5000 mg | INHALATION_SOLUTION | Freq: Three times a day (TID) | RESPIRATORY_TRACT | Status: DC | PRN
  Administered 2016-06-13: 2.5 mg via RESPIRATORY_TRACT
  Filled 2016-06-13: qty 3

## 2016-06-13 MED ORDER — METOPROLOL SUCCINATE ER 25 MG PO TB24
25.0000 mg | ORAL_TABLET | Freq: Every day | ORAL | Status: DC
  Administered 2016-06-13: 25 mg via ORAL
  Filled 2016-06-13 (×2): qty 1

## 2016-06-13 MED ORDER — METFORMIN HCL ER 750 MG PO TB24
2000.0000 mg | ORAL_TABLET | Freq: Every day | ORAL | Status: DC
  Administered 2016-06-13: 1500 mg via ORAL
  Filled 2016-06-13 (×2): qty 1

## 2016-06-13 MED ORDER — ESCITALOPRAM OXALATE 10 MG PO TABS
5.0000 mg | ORAL_TABLET | Freq: Every day | ORAL | Status: DC
  Administered 2016-06-13: 5 mg via ORAL
  Filled 2016-06-13: qty 1

## 2016-06-13 MED ORDER — LISINOPRIL 40 MG PO TABS
40.0000 mg | ORAL_TABLET | Freq: Every day | ORAL | Status: DC
  Administered 2016-06-13: 40 mg via ORAL
  Filled 2016-06-13: qty 1

## 2016-06-13 MED ORDER — GLIPIZIDE 5 MG PO TABS
5.0000 mg | ORAL_TABLET | Freq: Every day | ORAL | Status: DC
  Administered 2016-06-13: 5 mg via ORAL
  Filled 2016-06-13: qty 1

## 2016-06-13 NOTE — ED Notes (Signed)
Bed: WA32 Expected date:  Expected time:  Means of arrival:  Comments: 

## 2016-06-13 NOTE — ED Notes (Signed)
Respiratory called for a breathing treatment for patient.

## 2016-06-13 NOTE — Progress Notes (Signed)
[facility] and discussed at Quality Collaborative Meeting Recommendations: CXR ? PNA, decrease intake, need TSH to attempt geropsych placement, ? [facility] ED CM spoke with Dr Jacqulyn BathLong about pt and recommendations, re evaluation

## 2016-06-13 NOTE — ED Notes (Signed)
[facility] [phone] GINA Roselind MessierKINARD [phone] * PLEASE CALL W UPDATES
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
## 2016-06-13 NOTE — Progress Notes (Signed)
ED CM consulted by ED SW  Reports pt from [facility] and has home health services via [facility], wanting additional home health services Cm assisted by entering home health RN, PT, aide and SW - CM not sure what services pt has now at [facility] These orders can sent with pt and be provided to [facility] staff or the present home health staff can contact pt pcp to provide additional orders prn

## 2016-06-13 NOTE — ED Notes (Signed)
Pt's daughter states that the pt has been frequently expressing to his children that he is "ready to die" and "wishes that he could die"

## 2016-06-13 NOTE — Discharge Instructions (Signed)
We believe that your symptoms are caused today by pneumonia, an infection in your lung(s).  Fortunately you should start to improve quickly after taking your antibiotics.  Please take the full course of antibiotics as prescribed and drink plenty of fluids.   ° °Follow up with your doctor within 1-2 days.  If you develop any new or worsening symptoms, including but not limited to fever in spite of taking over-the-counter ibuprofen and/or Tylenol, persistent vomiting, worsening shortness of breath, or other symptoms that concern you, please return to the Emergency Department immediately.  ° ° °Pneumonia °Pneumonia is an infection of the lungs.  °CAUSES °Pneumonia may be caused by bacteria or a virus. Usually, these infections are caused by breathing infectious particles into the lungs (respiratory tract). °SIGNS AND SYMPTOMS  °Cough. °Fever. °Chest pain. °Increased rate of breathing. °Wheezing. °Mucus production. °DIAGNOSIS  °If you have the common symptoms of pneumonia, your health care provider will typically confirm the diagnosis with a chest X-ray. The X-ray will show an abnormality in the lung (pulmonary infiltrate) if you have pneumonia. Other tests of your blood, urine, or sputum may be done to find the specific cause of your pneumonia. Your health care provider may also do tests (blood gases or pulse oximetry) to see how well your lungs are working. °TREATMENT  °Some forms of pneumonia may be spread to other people when you cough or sneeze. You may be asked to wear a mask before and during your exam. Pneumonia that is caused by bacteria is treated with antibiotic medicine. Pneumonia that is caused by the influenza virus may be treated with an antiviral medicine. Most other viral infections must run their course. These infections will not respond to antibiotics.  °HOME CARE INSTRUCTIONS  °Cough suppressants may be used if you are losing too much rest. However, coughing protects you by clearing your lungs. You  should avoid using cough suppressants if you can. °Your health care provider may have prescribed medicine if he or she thinks your pneumonia is caused by bacteria or influenza. Finish your medicine even if you start to feel better. °Your health care provider may also prescribe an expectorant. This loosens the mucus to be coughed up. °Take medicines only as directed by your health care provider. °Do not smoke. Smoking is a common cause of bronchitis and can contribute to pneumonia. If you are a smoker and continue to smoke, your cough may last several weeks after your pneumonia has cleared. °A cold steam vaporizer or humidifier in your room or home may help loosen mucus. °Coughing is often worse at night. Sleeping in a semi-upright position in a recliner or using a couple pillows under your head will help with this. °Get rest as you feel it is needed. Your body will usually let you know when you need to rest. °PREVENTION °A pneumococcal shot (vaccine) is available to prevent a common bacterial cause of pneumonia. This is usually suggested for: °People over 65 years old. °Patients on chemotherapy. °People with chronic lung problems, such as bronchitis or emphysema. °People with immune system problems. °If you are over 65 or have a high risk condition, you may receive the pneumococcal vaccine if you have not received it before. In some countries, a routine influenza vaccine is also recommended. This vaccine can help prevent some cases of pneumonia. You may be offered the influenza vaccine as part of your care. °If you smoke, it is time to quit. You may receive instructions on how to stop smoking. Your   health care provider can provide medicines and counseling to help you quit. °SEEK MEDICAL CARE IF: °You have a fever. °SEEK IMMEDIATE MEDICAL CARE IF:  °Your illness becomes worse. This is especially true if you are elderly or weakened from any other disease. °You cannot control your cough with suppressants and are losing  sleep. °You begin coughing up blood. °You develop pain which is getting worse or is uncontrolled with medicines. °Any of the symptoms which initially brought you in for treatment are getting worse rather than better. °You develop shortness of breath or chest pain. °MAKE SURE YOU:  °Understand these instructions. °Will watch your condition. °Will get help right away if you are not doing well or get worse. °Document Released: 06/03/2005 Document Revised: 10/18/2013 Document Reviewed: 08/23/2010 °Exit[employer]® Patient Information ©2015 [employer]. This information is not intended to replace advice given to you by your health care provider. Make sure you discuss any questions you have with your health care provider. ° ° ° °

## 2016-06-13 NOTE — Progress Notes (Signed)
CSW spoke to patients daughter, Misty StanleyLisa, regarding discharge plans. Misty StanleyLisa states that patient lives at [facility] with his spouse. Misty StanleyLisa currently has home health services set up to meet with patient tomorrow 12/29. Misty StanleyLisa would like patient to return to independent living due to home health services already being scheduled and [facility] to [facility] for [facility] to follow up with patient regarding level of care. CSW will follow up with Molli [facility]ardKelly Norris [facility] with family. CSW updated RN and EDP.   Stacy GardnerErin Kruti Horacek, LCSWA Clinical Social Worker [phone]

## 2016-06-13 NOTE — ED Provider Notes (Signed)
Blood pressure 152/64, pulse 70, temperature 97.8 F (36.6 C), temperature source Oral, resp. rate 24, SpO2 93 %.  Assuming care from Dr. Criss AlvineGoldston.  In short, Kyle [name] is a 82 y.o. male with a chief complaint of Failure To Thrive .  Refer to the original H&P for additional details.  The current plan of care is to follow along with psychiatry team.   02:00 PM I was asked by the psychiatry team to perform a medical reevaluation. They expressed concern that the patient seems delirious, has pneumonia on chest x-ray, and that his expressions of suicidal ideation are likely from an underlying medical issue rather than a primary psychiatry issue. In review of the previous provider's note there was a questionable pneumonia on chest x-ray but the patient had no other symptoms concerning for pneumonia. He was started on doxycycline which I have continued for the next week. The patient has no hypoxemia, fever, tachycardia, other signs or symptoms of systemic infection. TSH ordered at the request of the psychiatry team which is normal. I discussed the case with the hospitalist and asked that they come down to see the patient for possible admission versus consultation.   03:19 PM Spoke with Dr. Irene LimboGoodrich who evaluated the patient and left a note. Appreciate assistance with the case. No indication for admission at this time. Psychiatry has signed off, after my discussion with them by phone, and do not feel that this requires Geri-Psych placement. I had a Kahliyah Dick discussion with the daughter at bedside. Plan for social work evaluation to discuss placement options vs returning home to re-start home health. The daughter had home health scheduled for this AM but they are on the fence and would like to discuss options with Child psychotherapistsocial worker. I have placed the consult and had the secretary page the social worker. If patient decides to return home would continue Doxycycline for the next week and follow up with the PCP.    Social work consulted and spoke with family. The daughter would like to have the patient discharged to follow up with home healthy and continue Doxycycline at home. Plan for discharge at this time.   Alona BeneJoshua Burnell Hurta, MD   Maia PlanJoshua G Ellerie Arenz, MD 06/13/16 [phone]

## 2016-06-13 NOTE — Consult Note (Signed)
[facility] Medical Consultation  Kyle LemonRobert P Parkinson WUJ:[id number] DOB: 17-May-1926 DOA: 06/12/2016 PCP: Rogelia BogaKWIATKOWSKI,PETER FRANK, MD   Requesting physician: Dr. Jacqulyn BathLong in ED Reason for consultation: pneumonia Date of consultation: 06/13/2016  Chief Complaint: tired  HPI:  82 year old PMH atrial fibrillation, pacemaker placement, diabetes mellitus who presented to the emergency department 12/27 for multiple complaints including poor oral intake, increased sleep. Diagnosed with pneumonia, started on oral doxycycline, no indication for medical admission and psychiatry was consulted by EDP for depression. I was asked to provide an opinion in regard to pneumonia and further treatment.  Patient was treated for pneumonia last month with several rounds of antibiotics. This was complicated by pleural effusion and he was followed by pulmonology as an outpatient. This subsequently resolved and he was last seen by his primary care physician November 28 at which time he seemed to be improved.  At this point the patient denies any complaints, no shortness of breath, no pain. No significant cough. He reports poor energy level, sleeps quite a bit but denies suicidal ideation and has no intent to harm himself. He feels like his breathing is fine. He's had no fever or systemic symptoms. He does report being tired.  History obtained from daughter at the bedside. Daughter reports the patient has been depressed for months now with increased sleep and decreased interest, concentration and energy level. He was started on Lexapro by his primary care physician several months ago, prior to becoming ill with pneumonia. 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  Review of Systems:  Negative for fever, new visual changes, sore throat, rash, new muscle aches, chest pain, shortness of breath, nausea, vomiting, bleeding, pain with  urination.  Past Medical History:  Diagnosis Date  . Benign prostatic hypertrophy   . Diabetes mellitus   . Elevated PSA   . Glaucoma   . Hyperlipidemia   . Hypertension   . Intermittent complete heart block (HCC)   . Pacemaker   . PAF (paroxysmal atrial fibrillation) (HCC)    Detected on pacemaker; the duration 44 seconds  . Trifascicular block    Past Surgical History:  Procedure Laterality Date  . CATARACT EXTRACTION, BILATERAL    . PERMANENT PACEMAKER INSERTION N/A 08/24/2012   Procedure: PERMANENT PACEMAKER INSERTION;  Surgeon: Duke SalviaSteven C Klein, MD;  Location[facility];  Service: Cardiovascular;  Laterality: N/A;  . TONSILLECTOMY     No Known Allergies Social History:  reports that he quit smoking about 2 months ago. His smoking use included Cigarettes. He has a 35.50 pack-year smoking history. He has never used smokeless tobacco. He reports that he drinks about 1.8 oz of alcohol per week . He reports that he does not use drugs. Family History  Problem Relation Age of Onset  . Heart disease Mother     atrial fib  . Heart disease Father [id number]    MI  . Cancer Father     prostate    Prior to Admission medications   Medication Sig Start Date End Date Taking? Authorizing Provider  albuterol (PROVENTIL) (2.5 MG/3ML) 0.083% nebulizer solution Take 2.5 mg by nebulization 3 (three) times daily.  04/25/16  Yes Historical Provider, MD  albuterol (PROVENTIL,VENTOLIN) 90 MCG/ACT inhaler Inhale 2 puffs into the lungs 2 (two) times daily.     Yes Historical Provider, MD  apixaban (ELIQUIS) 2.5 MG TABS tablet Take 1 tablet (2.5 mg total) by mouth  2 (two) times daily. 11/23/15  Yes Newman Nip, NP  ASMANEX 30 METERED DOSES 220 MCG/INH inhaler Inhale 2 puffs into the lungs daily.  04/10/16  Yes Historical Provider, MD  doxazosin (CARDURA) 4 MG tablet TAKE 1 TABLET EACH DAY. 03/07/16  Yes Gordy Savers, MD  escitalopram (LEXAPRO) 5 MG tablet Take 5 mg by mouth daily.   Yes Historical  Provider, MD  furosemide (LASIX) 40 MG tablet 1 half tablet daily as needed for fluid control 05/14/16  Yes Gordy Savers, MD  glipiZIDE (GLUCOTROL) 5 MG tablet Take 1 tablet (5 mg total) by mouth daily before breakfast. Patient taking differently: Take 5 mg by mouth 2 (two) times daily before a meal.  05/14/16  Yes Gordy Savers, MD  latanoprost (XALATAN) 0.005 % ophthalmic solution 1 drop at bedtime.   Yes Historical Provider, MD  lisinopril (PRINIVIL,ZESTRIL) 40 MG tablet TAKE 1 TABLET ONCE DAILY. 08/17/15  Yes Gordy Savers, MD  metFORMIN (GLUCOPHAGE-XR) 500 MG 24 hr tablet Take 2 tablets (1,000 mg total) by mouth daily with breakfast. Patient taking differently: Take 2,000 mg by mouth daily with breakfast.  05/14/16  Yes Gordy Savers, MD  metoprolol succinate (TOPROL-XL) 50 MG 24 hr tablet Take 1/2 tablet by mouth daily (25mg ) 11/02/15  Yes Newman Nip, NP  simvastatin (ZOCOR) 20 MG tablet TAKE ONE TABLET AT BEDTIME. 08/17/15  Yes Gordy Savers, MD  glucose blood (PRECISION XTRA TEST STRIPS) test strip 1 each by Other route daily as needed for other. Use as instructed 06/24/13   Gordy Savers, MD    Physical Exam: Vitals:   06/12/16 2251 06/13/16 0101 06/13/16 0510 06/13/16 1403  BP: 163/80 151/60 156/71 152/64  Pulse: 66 70 70 70  Resp: 16 16 16 24   Temp:  97.5 F (36.4 C) 97 F (36.1 C) 97.8 F (36.6 C)  TempSrc:  Oral Axillary Oral  SpO2: 96% 94% 93% 93%    Constitutional:  . Appears calm and comfortable Eyes:  . Pupils and irises appear normal . Normal lids ENMT:  . Mildly hard of hearing . Lips appear normal Neck:  . neck appears normal, no masses, normal ROM, supple . no thyromegaly Respiratory:  . CTA bilaterally, no w/r/r.  . Respiratory effort normal. No retractions or accessory muscle use noted Cardiovascular:  . RRR, no m/r/g . No LE extremity edema   Abdomen:  . Abdomen appears normal; no tenderness or masses . No  hernias noted Musculoskeletal:  . Digits/nails hands: no clubbing, cyanosis, petechiae, infection . RUE, LUE, RLE, LLE   o strength and tone normal, no atrophy, no abnormal movements o No tenderness, masses Skin:  . No rashes, lesions, ulcers . palpation of skin: no induration or nodules Psychiatric:  . judgement and insight appear normal . Mental status o Orientation to person, place, time  o Mood appears depressed, affect somewhat flat  Data reviewed:  I have personally reviewed following labs and imaging studies Labs on Admission:  CBC:  Recent Labs Lab 06/12/16 2316  WBC 9.5  NEUTROABS 6.0  HGB 11.5*  HCT 36.0*  MCV 85.7  PLT 243    Basic Metabolic Panel:  Recent Labs Lab 06/12/16 2316  NA 143  K 3.6  CL 104  CO2 29  GLUCOSE 123*  BUN 20  CREATININE 0.71  CALCIUM 9.6   Liver Function Tests:  Recent Labs Lab 06/12/16 2316  AST 16  ALT 13*  ALKPHOS 63  BILITOT  0.8  PROT 6.7  ALBUMIN 3.7   CBG:  Recent Labs Lab 06/12/16 1653  GLUCAP 114*   Thyroid function studies  Recent Labs  06/13/16 1209  TSH 0.802   Urinalysis    Component Value Date/Time   COLORURINE AMBER (A) 06/12/2016 1748   APPEARANCEUR HAZY (A) 06/12/2016 1748   LABSPEC 1.027 06/12/2016 1748   PHURINE 5.0 06/12/2016 1748   GLUCOSEU NEGATIVE 06/12/2016 1748   HGBUR NEGATIVE 06/12/2016 1748   HGBUR negative 04/05/2009 0856   BILIRUBINUR NEGATIVE 06/12/2016 1748   BILIRUBINUR n 09/20/2015 0840   KETONESUR NEGATIVE 06/12/2016 1748   PROTEINUR 100 (A) 06/12/2016 1748   UROBILINOGEN 0.2 09/20/2015 0840   UROBILINOGEN 0.2 04/05/2009 0856   NITRITE NEGATIVE 06/12/2016 1748   LEUKOCYTESUR NEGATIVE 06/12/2016 1748     Inpatient Medications:   Scheduled Meds: . apixaban  2.5 mg Oral BID  . doxazosin  4 mg Oral Daily  . doxycycline  100 mg Oral Daily  . escitalopram  5 mg Oral Daily  . glipiZIDE  5 mg Oral QAC breakfast  . latanoprost  1 drop Both Eyes QHS  .  lisinopril  40 mg Oral Daily  . metFORMIN  2,000 mg Oral Q breakfast  . metoprolol succinate  25 mg Oral Daily  . simvastatin  20 mg Oral QHS   Continuous Infusions:  Imaging on Admission: Dg Chest 2 View  Result Date: 06/13/2016 CLINICAL DATA:  Failure to thrive. Status post unwitnessed fall. Upper back pain. Initial encounter. EXAM: CHEST  2 VIEW COMPARISON:  Chest radiograph performed 04/29/2016 FINDINGS: The lungs are well-aerated. Patchy bilateral airspace opacification is noted. This may reflect pulmonary edema or pneumonia. Small bilateral pleural effusions are seen. No pneumothorax is identified. The heart is mildly enlarged. A pacemaker is noted at the left chest wall, with leads ending at the right atrium and right ventricle. No acute osseous abnormalities are seen. IMPRESSION: Patchy bilateral airspace opacification noted. This may reflect pulmonary edema or pneumonia. Small bilateral pleural effusions. Mild cardiomegaly. Electronically Signed   By: Roanna RaiderJeffery  Chang M.D.   On: 06/13/2016 00:08   Ct Head Wo Contrast  Result Date: 06/13/2016 CLINICAL DATA:  Acute onset of disorientation.  Initial encounter. EXAM: CT HEAD WITHOUT CONTRAST TECHNIQUE: Contiguous axial images were obtained from the base of the skull through the vertex without intravenous contrast. COMPARISON:  None. FINDINGS: Brain: No evidence of acute infarction, hemorrhage, hydrocephalus, extra-axial collection or mass lesion/mass effect. Prominence of the ventricles and sulci reflects mild to moderate cortical volume loss. Mild cerebellar atrophy is noted. Scattered periventricular and subcortical white matter change likely reflects small vessel ischemic microangiopathy. Chronic lacunar infarcts are seen at the left basal ganglia. Chronic lacunar infarcts are seen at the thalami bilaterally, and at the right side of the pons. The fourth ventricle is within normal limits. The cerebral hemispheres demonstrate grossly normal  gray-white differentiation. No mass effect or midline shift is seen. Vascular: No hyperdense vessel or unexpected calcification. Skull: There is no evidence of fracture; visualized osseous structures are unremarkable in appearance. Sinuses/Orbits: The orbits are within normal limits. The paranasal sinuses and mastoid air cells are well-aerated. Other: No significant soft tissue abnormalities are seen. IMPRESSION: 1. No acute intracranial pathology seen on CT. 2. Mild to moderate cortical volume loss and scattered small vessel ischemic microangiopathy. 3. Chronic lacunar infarcts at the left basal ganglia, and at the thalami bilaterally. Chronic lacunar infarct at the right side of the pons. Electronically Signed  By: Roanna Raider M.D.   On: 06/13/2016 00:07    EKG: Independently reviewed. Paced rhythm.  Principal Problem:   Pneumonia   Impression/Recommendations 1. Recurrent pneumonia. Chest x-ray independently reviewed. He has no evidence of volume overload and no rales on examination. Suspect mild recurrent pneumonia. He has no hypoxia, hemodynamics are stable, he is asymptomatic and his respiratory rate is acceptable. I did not see any indication for inpatient treatment. I would recommend outpatient treatment with oral antibiotics. 2. Depression. This proceeds his first bout of pneumonia and has been present for several months, being treated with Lexapro by his primary care physician. It is completely unrelated to his medical condition. I recommend further treatment but will defer specific recommendations to psychiatry.   Thank you for this consultation.  I discussed my recommendations with requesting physician Dr. Jacqulyn Bath and with patient and daughter.  Brendia Sacks, MD  [facility] Direct contact: [phone] --Via amion app OR  --[email or web address]; password [email or web address]  7PM-7AM contact night coverage as above  Time spent: 60 minutes

## 2016-06-14 ENCOUNTER — Telehealth: Payer: Self-pay | Admitting: Internal Medicine

## 2016-06-14 NOTE — Telephone Encounter (Signed)
Noted. Pt's daughter aware to call office if any changes with pt. Nothing further needed at this time.

## 2016-06-14 NOTE — Telephone Encounter (Signed)
[facility] - Client TELEPHONE ADVICE RECORD [facility] Patient Name: Kyle GrillsROBERT Kincheloe DOB: 05-23-1926 Initial Comment caller states father has depression Nurse Assessment Nurse: Lane HackerHarley, RN, Elvin SoWindy Date/Time (Eastern Time): 06/14/2016 9:34:57 AM Confirm and document reason for call. If symptomatic, describe symptoms. ---Caller states father has depression. On Lexapro for several months. -- He lives at a facility. He had fallen, was confused, and low blood sugar. Stating he doesn't want to live, and "maybe a should" jump off a building. Seen in ER and had a psych eval. Then needs to be seen today, but his MD is not there. Family is helping to keep an eye on him. -- She can't bring him in to see anyone today. He is not having any suicidal thoughts/attempts now. Does the patient have any new or worsening symptoms? ---No Please document clinical information provided and list any resource used. ---RN scheduled appt for Tuesday at 4:15 pm with PCP. Caller verb. understanding and aware to have family call back if any new or worsening s/s. Guidelines Guideline Title Affirmed Question Affirmed Notes Final Disposition User Clinical Call Pewee ValleyHarley, RN, MedtronicWindy

## 2016-06-18 ENCOUNTER — Encounter: Payer: Self-pay | Admitting: Internal Medicine

## 2016-06-18 ENCOUNTER — Ambulatory Visit (INDEPENDENT_AMBULATORY_CARE_PROVIDER_SITE_OTHER): Payer: Medicare Other | Admitting: Internal Medicine

## 2016-06-18 ENCOUNTER — Telehealth: Payer: Self-pay | Admitting: Internal Medicine

## 2016-06-18 VITALS — BP 156/80 | HR 71 | Temp 97.6°F | Resp 28 | Ht 68.0 in | Wt 162.4 lb

## 2016-06-18 DIAGNOSIS — I4891 Unspecified atrial fibrillation: Secondary | ICD-10-CM | POA: Diagnosis not present

## 2016-06-18 DIAGNOSIS — J189 Pneumonia, unspecified organism: Secondary | ICD-10-CM

## 2016-06-18 DIAGNOSIS — E119 Type 2 diabetes mellitus without complications: Secondary | ICD-10-CM | POA: Diagnosis not present

## 2016-06-18 DIAGNOSIS — I480 Paroxysmal atrial fibrillation: Secondary | ICD-10-CM

## 2016-06-18 DIAGNOSIS — I10 Essential (primary) hypertension: Secondary | ICD-10-CM

## 2016-06-18 DIAGNOSIS — H409 Unspecified glaucoma: Secondary | ICD-10-CM | POA: Diagnosis not present

## 2016-06-18 DIAGNOSIS — G4733 Obstructive sleep apnea (adult) (pediatric): Secondary | ICD-10-CM

## 2016-06-18 DIAGNOSIS — M6281 Muscle weakness (generalized): Secondary | ICD-10-CM | POA: Diagnosis not present

## 2016-06-18 DIAGNOSIS — E1140 Type 2 diabetes mellitus with diabetic neuropathy, unspecified: Secondary | ICD-10-CM | POA: Diagnosis not present

## 2016-06-18 DIAGNOSIS — R278 Other lack of coordination: Secondary | ICD-10-CM | POA: Diagnosis not present

## 2016-06-18 DIAGNOSIS — J449 Chronic obstructive pulmonary disease, unspecified: Secondary | ICD-10-CM | POA: Diagnosis not present

## 2016-06-18 DIAGNOSIS — R262 Difficulty in walking, not elsewhere classified: Secondary | ICD-10-CM | POA: Diagnosis not present

## 2016-06-18 DIAGNOSIS — F321 Major depressive disorder, single episode, moderate: Secondary | ICD-10-CM

## 2016-06-18 DIAGNOSIS — R5381 Other malaise: Secondary | ICD-10-CM | POA: Diagnosis not present

## 2016-06-18 DIAGNOSIS — I495 Sick sinus syndrome: Secondary | ICD-10-CM | POA: Diagnosis not present

## 2016-06-18 DIAGNOSIS — Z9181 History of falling: Secondary | ICD-10-CM | POA: Diagnosis not present

## 2016-06-18 DIAGNOSIS — E0842 Diabetes mellitus due to underlying condition with diabetic polyneuropathy: Secondary | ICD-10-CM

## 2016-06-18 DIAGNOSIS — R269 Unspecified abnormalities of gait and mobility: Secondary | ICD-10-CM | POA: Diagnosis not present

## 2016-06-18 NOTE — Patient Instructions (Signed)
Discontinue glipizide  Drink as much fluid as you  can tolerate over the next few days   Please check your hemoglobin A1c every 3 months

## 2016-06-18 NOTE — Progress Notes (Signed)
Pre visit review using our clinic review tool, if applicable. No additional management support is needed unless otherwise documented below in the visit note. 

## 2016-06-18 NOTE — Telephone Encounter (Signed)
Patient Name: Kyle GrillsROBERT [name]  DOB: Jun 14, 1926    Initial Comment father is very weak, not eating, diabetic, confused, disorientation that started, BS been running in the 60-70    Nurse Assessment  Nurse: Stefano GaulStringer, RN, Dwana CurdVera Date/Time (Eastern Time): 06/18/2016 10:46:58 AM  Confirm and document reason for call. If symptomatic, describe symptoms. ---Caller states father is having periods of disorientation for a week. He is having periods of being clear to having periods of confusion. He is weak. Went to the ER last week. He is taking antibiotics for pneumonia. He is taking doxycycline. Has appt today at 4:15 pm. Blood sugar is 60-70 when they are checked by the nurses at the retirement home. He is in a retirement home. He is not eating.  Does the patient have any new or worsening symptoms? ---Yes  Will a triage be completed? ---Yes  Related visit to physician within the last 2 weeks? ---Yes  Does the PT have any chronic conditions? (i.e. diabetes, asthma, etc.) ---Yes  List chronic conditions. ---diabetes; a fib; pacemaker  Is this a behavioral health or substance abuse call? ---No     Guidelines    Guideline Title Affirmed Question Affirmed Notes  Weakness (Generalized) and Fatigue Difficulty breathing    Final Disposition User   Go to ED Now Stefano GaulStringer, RN, Dwana CurdVera    Comments  daughter states she is not going to take her father back to the ER. He has appt today at 4:15 pm. Wants to know if he can see Dr. Amador CunasKwiatkowski sooner.  daughter states they spent 27.5 hrs in the ER last week and she is not taking him back to the ER.  Called office and spoke to SchneiderPatty and gave report that pt is very weak. He is not eating. Having periods of confusion. Went to the ER last week and spent 27.5 hrs there. They prescribed doxycycline for pneumonia as he recently was diagnosed with pneumonia and they thought it might not be cleared up. Blood sugar 60-70. Having difficulty breathing with triage outcome of go to ER now.  Pt has appt at 4:15 pm today. Daughter wants to know if Dr. Kirtland BouchardK can see him sooner.   Referrals  GO TO FACILITY REFUSED   Disagree/Comply: Disagree  Disagree/Comply Reason: Disagree with instructions

## 2016-06-18 NOTE — Progress Notes (Signed)
Subjective:    Patient ID: Basilio CairoRobert P Fleagle, male    DOB: [birth year], 83 y.o.   MRN: [id number]  HPI  83 year old patient who is seen today in follow-up after recent ED evaluation. Patient has had increasing the right hand pain and swelling involving primarily the right second MCP joint area.  He does have a history of gout and has had some inflammatory changes at this site in the past He has chest congestion.  Recent chest x-ray revealed some airspace densities and he is on doxycycline. He has had some increasing confusion, which is episodic.  He has had frequent falls and worsening depression.  He has had some associated anorexia and incontinence. He has type 2 diabetes and has had blood sugars as low as 66.  [facility]  He has had 3 falls over the past 2 weeks  ED notes, lab and radiographs reviewed  Past Medical History:  Diagnosis Date  . Benign prostatic hypertrophy   . Diabetes mellitus   . Elevated PSA   . Glaucoma   . Hyperlipidemia   . Hypertension   . Intermittent complete heart block (HCC)   . Pacemaker   . PAF (paroxysmal atrial fibrillation) (HCC)    Detected on pacemaker; the duration 44 seconds  . Trifascicular block      Social History   Social History  . Marital status: Married    Spouse name: N/A  . Number of children: 4  . Years of education: N/A   Occupational History  . Not on file.   Social History Main Topics  . Smoking status: Former Smoker    Packs/day: 0.50    Years: 71.00    Types: Cigarettes    Quit date: 04/08/2016  . Smokeless tobacco: Never Used  . Alcohol use 1.8 oz/week    1 Glasses of wine, 2 Shots of liquor per week     Comment: bourbon, 2 drinks a day  . Drug use: No  . Sexual activity: Not on file   Other Topics Concern  . Not on file   Social History Narrative   Lives at home with wife   [facility]   4 children, oldest (daughter) helps the most  Misty Stanley(Lisa, who is the primary contact person)   OCCUPATION: retired, Writerbusiness machines/computers    Past Surgical History:  Procedure Laterality Date  . CATARACT EXTRACTION, BILATERAL    . PERMANENT PACEMAKER INSERTION N/A 08/24/2012   Procedure: PERMANENT PACEMAKER INSERTION;  Surgeon: Duke SalviaSteven C Klein, MD;  [facility];  Service: Cardiovascular;  Laterality: N/A;  . TONSILLECTOMY      Family History  Problem Relation Age of Onset  . Heart disease Mother     atrial fib  . Heart disease Father [id number]    MI  . Cancer Father     prostate    No Known Allergies  Current Outpatient Prescriptions on File Prior to Visit  Medication Sig Dispense Refill  . albuterol (PROVENTIL) (2.5 MG/3ML) 0.083% nebulizer solution Take 2.5 mg by nebulization 3 (three) times daily.     Marland Kitchen. albuterol (PROVENTIL,VENTOLIN) 90 MCG/ACT inhaler Inhale 2 puffs into the lungs 2 (two) times daily.      Marland Kitchen. apixaban (ELIQUIS) 2.5 MG TABS tablet Take 1 tablet (2.5 mg total) by mouth 2 (two) times daily. 60 tablet 1  . ASMANEX 30 METERED DOSES 220 MCG/INH inhaler Inhale 2 puffs into the lungs daily.     .Marland Kitchen  doxazosin (CARDURA) 4 MG tablet TAKE 1 TABLET EACH DAY. 90 tablet 1  . doxycycline (VIBRAMYCIN) 100 MG capsule Take 1 capsule (100 mg total) by mouth 2 (two) times daily. 20 capsule 0  . furosemide (LASIX) 40 MG tablet 1 half tablet daily as needed for fluid control 90 tablet 3  . glucose blood (PRECISION XTRA TEST STRIPS) test strip 1 each by Other route daily as needed for other. Use as instructed 100 each 3  . latanoprost (XALATAN) 0.005 % ophthalmic solution 1 drop at bedtime.    Marland Kitchen lisinopril (PRINIVIL,ZESTRIL) 40 MG tablet TAKE 1 TABLET ONCE DAILY. 90 tablet 3  . metFORMIN (GLUCOPHAGE-XR) 500 MG 24 hr tablet Take 2 tablets (1,000 mg total) by mouth daily with breakfast. (Patient taking differently: Take 2,000 mg by mouth daily with breakfast. ) 180 tablet 4  . metoprolol succinate (TOPROL-XL) 50 MG 24 hr tablet  Take 1/2 tablet by mouth daily (25mg ) 30 tablet 6  . simvastatin (ZOCOR) 20 MG tablet TAKE ONE TABLET AT BEDTIME. 90 tablet 3   No current facility-administered medications on file prior to visit.     BP (!) 156/80 (BP Location: Left Arm, Patient Position: Sitting, Cuff Size: Normal)   Pulse 71   Temp 97.6 F (36.4 C) (Oral)   Resp (!) 28   Ht 5\' 8"  (1.727 m)   Wt 162 lb 6.1 oz (73.7 kg)   SpO2 96%   BMI 24.69 kg/m     Review of Systems  Constitutional: Positive for activity change, appetite change and fatigue. Negative for chills and fever.  HENT: Positive for congestion. Negative for dental problem, ear pain, hearing loss, sore throat, tinnitus, trouble swallowing and voice change.   Eyes: Negative for pain, discharge and visual disturbance.  Respiratory: Positive for cough. Negative for chest tightness, wheezing and stridor.   Cardiovascular: Negative for chest pain, palpitations and leg swelling.  Gastrointestinal: Negative for abdominal distention, abdominal pain, blood in stool, constipation, diarrhea, nausea and vomiting.  Genitourinary: Negative for difficulty urinating, discharge, flank pain, genital sores, hematuria and urgency.  Musculoskeletal: Positive for gait problem and joint swelling. Negative for arthralgias, back pain, myalgias and neck stiffness.  Skin: Negative for rash.  Neurological: Negative for dizziness, syncope, speech difficulty, weakness, numbness and headaches.  Hematological: Negative for adenopathy. Does not bruise/bleed easily.  Psychiatric/Behavioral: Positive for confusion, decreased concentration and dysphoric mood. Negative for behavioral problems. The patient is not nervous/anxious.        Objective:   Physical Exam  Constitutional: He is oriented to person, place, and time. He appears well-developed and well-nourished. No distress.  Weak Alert and appropriate Afebrile Blood pressure 140/80 O2 saturation 96  HENT:  Head: Normocephalic.   Right Ear: External ear normal.  Left Ear: External ear normal.  Eyes: Conjunctivae and EOM are normal.  Neck: Normal range of motion.  Cardiovascular: Normal rate and normal heart sounds.   Pulmonary/Chest: Effort normal.  Few scattered rhonchi  Abdominal: Bowel sounds are normal.  Musculoskeletal: Normal range of motion. He exhibits no edema or tenderness.  Neurological: He is alert and oriented to person, place, and time.  Psychiatric: He has a normal mood and affect. His behavior is normal.          Assessment & Plan:   Resolving bronchitis.  Will complete antibiotic therapy Dependence in aspects of daily living.  Patient does have a bed available at an assisted living facility Gait abnormality.  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  This is improving Atrial fibrillation.  Continue anticoagulation  Diabetes mellitus.  Will discontinue glipizide at this time and continue metformin Essential hypertension, stable  Follow-up one month  Rogelia Boga

## 2016-06-18 NOTE — Telephone Encounter (Signed)
Pt being seen this afternoon by Dr Kirtland BouchardK.

## 2016-06-24 ENCOUNTER — Ambulatory Visit (INDEPENDENT_AMBULATORY_CARE_PROVIDER_SITE_OTHER): Payer: Medicare Other | Admitting: *Deleted

## 2016-06-24 ENCOUNTER — Telehealth: Payer: Self-pay | Admitting: Cardiology

## 2016-06-24 DIAGNOSIS — I495 Sick sinus syndrome: Secondary | ICD-10-CM | POA: Diagnosis not present

## 2016-06-24 DIAGNOSIS — R269 Unspecified abnormalities of gait and mobility: Secondary | ICD-10-CM | POA: Diagnosis not present

## 2016-06-24 DIAGNOSIS — R5381 Other malaise: Secondary | ICD-10-CM | POA: Diagnosis not present

## 2016-06-24 DIAGNOSIS — E1140 Type 2 diabetes mellitus with diabetic neuropathy, unspecified: Secondary | ICD-10-CM | POA: Diagnosis not present

## 2016-06-24 DIAGNOSIS — J449 Chronic obstructive pulmonary disease, unspecified: Secondary | ICD-10-CM | POA: Diagnosis not present

## 2016-06-24 DIAGNOSIS — I4891 Unspecified atrial fibrillation: Secondary | ICD-10-CM | POA: Diagnosis not present

## 2016-06-24 NOTE — Telephone Encounter (Signed)
Confirmed remote transmission w/ pt daughter.   

## 2016-06-25 NOTE — Progress Notes (Signed)
Remote pacemaker transmission.   

## 2016-06-26 ENCOUNTER — Encounter: Payer: Self-pay | Admitting: Cardiology

## 2016-07-05 ENCOUNTER — Ambulatory Visit: Payer: Medicare Other | Admitting: Internal Medicine

## 2016-07-09 LAB — CUP PACEART REMOTE DEVICE CHECK
Battery Remaining Longevity: 103 mo
Battery Remaining Percentage: 81 %
Brady Statistic AS VP Percent: 40 %
Brady Statistic AS VS Percent: 1 %
Brady Statistic RA Percent Paced: 11 %
Implantable Lead Implant Date: 20140310
Implantable Lead Location: 753859
Lead Channel Impedance Value: 330 Ohm
Lead Channel Pacing Threshold Amplitude: 0.5 V
Lead Channel Pacing Threshold Pulse Width: 0.4 ms
Lead Channel Sensing Intrinsic Amplitude: 11.5 mV
Lead Channel Sensing Intrinsic Amplitude: 2.6 mV
Lead Channel Setting Pacing Amplitude: 2 V
MDC IDC LEAD IMPLANT DT: 20140310
MDC IDC LEAD LOCATION: 753860
MDC IDC MSMT BATTERY VOLTAGE: 2.93 V
MDC IDC MSMT LEADCHNL RV IMPEDANCE VALUE: 580 Ohm
MDC IDC MSMT LEADCHNL RV PACING THRESHOLD AMPLITUDE: 0.5 V
MDC IDC MSMT LEADCHNL RV PACING THRESHOLD PULSEWIDTH: 0.4 ms
MDC IDC PG IMPLANT DT: 20140310
MDC IDC PG SERIAL: 7379731
MDC IDC SESS DTM: 20180108161557
MDC IDC SET LEADCHNL RV PACING AMPLITUDE: 0.75 V
MDC IDC SET LEADCHNL RV PACING PULSEWIDTH: 0.4 ms
MDC IDC SET LEADCHNL RV SENSING SENSITIVITY: 2 mV
MDC IDC STAT BRADY AP VP PERCENT: 59 %
MDC IDC STAT BRADY AP VS PERCENT: 1 %
MDC IDC STAT BRADY RV PERCENT PACED: 87 %

## 2016-07-17 DIAGNOSIS — H02102 Unspecified ectropion of right lower eyelid: Secondary | ICD-10-CM | POA: Diagnosis not present

## 2016-07-17 DIAGNOSIS — H401131 Primary open-angle glaucoma, bilateral, mild stage: Secondary | ICD-10-CM | POA: Diagnosis not present

## 2016-07-17 DIAGNOSIS — H02105 Unspecified ectropion of left lower eyelid: Secondary | ICD-10-CM | POA: Diagnosis not present

## 2016-07-23 ENCOUNTER — Ambulatory Visit: Payer: Self-pay | Admitting: Internal Medicine

## 2016-07-25 DIAGNOSIS — I4891 Unspecified atrial fibrillation: Secondary | ICD-10-CM | POA: Diagnosis not present

## 2016-07-25 DIAGNOSIS — E1140 Type 2 diabetes mellitus with diabetic neuropathy, unspecified: Secondary | ICD-10-CM | POA: Diagnosis not present

## 2016-07-25 DIAGNOSIS — J449 Chronic obstructive pulmonary disease, unspecified: Secondary | ICD-10-CM | POA: Diagnosis not present

## 2016-07-25 DIAGNOSIS — R269 Unspecified abnormalities of gait and mobility: Secondary | ICD-10-CM | POA: Diagnosis not present

## 2016-07-29 ENCOUNTER — Telehealth: Payer: Self-pay | Admitting: Internal Medicine

## 2016-07-29 NOTE — Telephone Encounter (Signed)
New message      Talk to someone in device clinic regarding scheduling an ov

## 2016-07-29 NOTE — Telephone Encounter (Signed)
Ms. Kyle [name] reports that Mr. Kyle [name] is becoming increasingly difficult to bring to the office. Wondering if he may be able to be followed remotely only. Will route to Dr. Graciela HusbandsKlein and Herbert SetaHeather for recommendations. 8+ years on PPM battery.

## 2016-07-30 NOTE — Telephone Encounter (Signed)
jsut fine thanks for thinking of this

## 2016-08-02 NOTE — Telephone Encounter (Signed)
Ms. Kyle [name] made aware no OV necessary for PPM interrogation. Remote transmissions only.

## 2016-08-04 ENCOUNTER — Encounter (HOSPITAL_COMMUNITY): Payer: Self-pay

## 2016-08-04 ENCOUNTER — Emergency Department (HOSPITAL_COMMUNITY): Payer: Medicare Other

## 2016-08-04 ENCOUNTER — Inpatient Hospital Stay (HOSPITAL_COMMUNITY): Payer: Medicare Other

## 2016-08-04 ENCOUNTER — Inpatient Hospital Stay (HOSPITAL_COMMUNITY)
Admission: EM | Admit: 2016-08-04 | Discharge: 2016-08-15 | DRG: 291 | Disposition: A | Discharge status: Hospice | Payer: Medicare Other | Attending: Internal Medicine | Admitting: Internal Medicine

## 2016-08-04 DIAGNOSIS — J9601 Acute respiratory failure with hypoxia: Secondary | ICD-10-CM | POA: Diagnosis not present

## 2016-08-04 DIAGNOSIS — J90 Pleural effusion, not elsewhere classified: Secondary | ICD-10-CM | POA: Diagnosis present

## 2016-08-04 DIAGNOSIS — I10 Essential (primary) hypertension: Secondary | ICD-10-CM | POA: Diagnosis present

## 2016-08-04 DIAGNOSIS — J9621 Acute and chronic respiratory failure with hypoxia: Secondary | ICD-10-CM | POA: Diagnosis present

## 2016-08-04 DIAGNOSIS — E0842 Diabetes mellitus due to underlying condition with diabetic polyneuropathy: Secondary | ICD-10-CM | POA: Diagnosis not present

## 2016-08-04 DIAGNOSIS — J948 Other specified pleural conditions: Secondary | ICD-10-CM | POA: Diagnosis not present

## 2016-08-04 DIAGNOSIS — Z4682 Encounter for fitting and adjustment of non-vascular catheter: Secondary | ICD-10-CM | POA: Diagnosis not present

## 2016-08-04 DIAGNOSIS — I4891 Unspecified atrial fibrillation: Secondary | ICD-10-CM | POA: Diagnosis present

## 2016-08-04 DIAGNOSIS — R0602 Shortness of breath: Secondary | ICD-10-CM

## 2016-08-04 DIAGNOSIS — R262 Difficulty in walking, not elsewhere classified: Secondary | ICD-10-CM

## 2016-08-04 DIAGNOSIS — J9811 Atelectasis: Secondary | ICD-10-CM | POA: Diagnosis not present

## 2016-08-04 DIAGNOSIS — R634 Abnormal weight loss: Secondary | ICD-10-CM | POA: Diagnosis present

## 2016-08-04 DIAGNOSIS — I272 Pulmonary hypertension, unspecified: Secondary | ICD-10-CM | POA: Diagnosis not present

## 2016-08-04 DIAGNOSIS — J440 Chronic obstructive pulmonary disease with acute lower respiratory infection: Secondary | ICD-10-CM | POA: Diagnosis present

## 2016-08-04 DIAGNOSIS — F329 Major depressive disorder, single episode, unspecified: Secondary | ICD-10-CM | POA: Diagnosis present

## 2016-08-04 DIAGNOSIS — I5043 Acute on chronic combined systolic (congestive) and diastolic (congestive) heart failure: Secondary | ICD-10-CM | POA: Diagnosis not present

## 2016-08-04 DIAGNOSIS — Z809 Family history of malignant neoplasm, unspecified: Secondary | ICD-10-CM

## 2016-08-04 DIAGNOSIS — G4733 Obstructive sleep apnea (adult) (pediatric): Secondary | ICD-10-CM | POA: Diagnosis not present

## 2016-08-04 DIAGNOSIS — J9382 Other air leak: Secondary | ICD-10-CM | POA: Diagnosis not present

## 2016-08-04 DIAGNOSIS — Z7901 Long term (current) use of anticoagulants: Secondary | ICD-10-CM

## 2016-08-04 DIAGNOSIS — I482 Chronic atrial fibrillation: Secondary | ICD-10-CM | POA: Diagnosis not present

## 2016-08-04 DIAGNOSIS — E1142 Type 2 diabetes mellitus with diabetic polyneuropathy: Secondary | ICD-10-CM | POA: Diagnosis not present

## 2016-08-04 DIAGNOSIS — J95811 Postprocedural pneumothorax: Secondary | ICD-10-CM | POA: Diagnosis not present

## 2016-08-04 DIAGNOSIS — E785 Hyperlipidemia, unspecified: Secondary | ICD-10-CM | POA: Diagnosis present

## 2016-08-04 DIAGNOSIS — I5031 Acute diastolic (congestive) heart failure: Secondary | ICD-10-CM | POA: Diagnosis present

## 2016-08-04 DIAGNOSIS — H409 Unspecified glaucoma: Secondary | ICD-10-CM | POA: Diagnosis present

## 2016-08-04 DIAGNOSIS — Z79899 Other long term (current) drug therapy: Secondary | ICD-10-CM | POA: Diagnosis not present

## 2016-08-04 DIAGNOSIS — J690 Pneumonitis due to inhalation of food and vomit: Secondary | ICD-10-CM | POA: Diagnosis not present

## 2016-08-04 DIAGNOSIS — I110 Hypertensive heart disease with heart failure: Principal | ICD-10-CM | POA: Diagnosis present

## 2016-08-04 DIAGNOSIS — Y95 Nosocomial condition: Secondary | ICD-10-CM | POA: Diagnosis present

## 2016-08-04 DIAGNOSIS — Z7189 Other specified counseling: Secondary | ICD-10-CM | POA: Diagnosis not present

## 2016-08-04 DIAGNOSIS — E873 Alkalosis: Secondary | ICD-10-CM | POA: Diagnosis not present

## 2016-08-04 DIAGNOSIS — I509 Heart failure, unspecified: Secondary | ICD-10-CM | POA: Diagnosis not present

## 2016-08-04 DIAGNOSIS — J939 Pneumothorax, unspecified: Secondary | ICD-10-CM | POA: Diagnosis not present

## 2016-08-04 DIAGNOSIS — J810 Acute pulmonary edema: Secondary | ICD-10-CM | POA: Diagnosis not present

## 2016-08-04 DIAGNOSIS — S270XXS Traumatic pneumothorax, sequela: Secondary | ICD-10-CM | POA: Diagnosis not present

## 2016-08-04 DIAGNOSIS — Z66 Do not resuscitate: Secondary | ICD-10-CM | POA: Diagnosis present

## 2016-08-04 DIAGNOSIS — Z950 Presence of cardiac pacemaker: Secondary | ICD-10-CM | POA: Diagnosis not present

## 2016-08-04 DIAGNOSIS — Z7984 Long term (current) use of oral hypoglycemic drugs: Secondary | ICD-10-CM

## 2016-08-04 DIAGNOSIS — J441 Chronic obstructive pulmonary disease with (acute) exacerbation: Secondary | ICD-10-CM | POA: Diagnosis not present

## 2016-08-04 DIAGNOSIS — J181 Lobar pneumonia, unspecified organism: Secondary | ICD-10-CM | POA: Diagnosis not present

## 2016-08-04 DIAGNOSIS — R531 Weakness: Secondary | ICD-10-CM | POA: Diagnosis not present

## 2016-08-04 DIAGNOSIS — Z515 Encounter for palliative care: Secondary | ICD-10-CM | POA: Diagnosis not present

## 2016-08-04 DIAGNOSIS — Z8249 Family history of ischemic heart disease and other diseases of the circulatory system: Secondary | ICD-10-CM

## 2016-08-04 DIAGNOSIS — S270XXA Traumatic pneumothorax, initial encounter: Secondary | ICD-10-CM | POA: Diagnosis not present

## 2016-08-04 DIAGNOSIS — R609 Edema, unspecified: Secondary | ICD-10-CM

## 2016-08-04 DIAGNOSIS — J9600 Acute respiratory failure, unspecified whether with hypoxia or hypercapnia: Secondary | ICD-10-CM | POA: Diagnosis not present

## 2016-08-04 DIAGNOSIS — I480 Paroxysmal atrial fibrillation: Secondary | ICD-10-CM | POA: Diagnosis not present

## 2016-08-04 DIAGNOSIS — J189 Pneumonia, unspecified organism: Secondary | ICD-10-CM | POA: Diagnosis present

## 2016-08-04 DIAGNOSIS — Z87891 Personal history of nicotine dependence: Secondary | ICD-10-CM | POA: Diagnosis not present

## 2016-08-04 HISTORY — DX: Heart failure, unspecified: I50.9

## 2016-08-04 HISTORY — DX: Chronic obstructive pulmonary disease, unspecified: J44.9

## 2016-08-04 LAB — STREP PNEUMONIAE URINARY ANTIGEN: Strep Pneumo Urinary Antigen: NEGATIVE

## 2016-08-04 LAB — CBC WITH DIFFERENTIAL/PLATELET
Basophils Absolute: 0 10*3/uL (ref 0.0–0.1)
Basophils Relative: 0 %
EOS PCT: 1 %
Eosinophils Absolute: 0.2 10*3/uL (ref 0.0–0.7)
HCT: 31.1 % — ABNORMAL LOW (ref 39.0–52.0)
HEMOGLOBIN: 9.6 g/dL — AB (ref 13.0–17.0)
LYMPHS ABS: 1.7 10*3/uL (ref 0.7–4.0)
Lymphocytes Relative: 14 %
MCH: 27.4 pg (ref 26.0–34.0)
MCHC: 30.9 g/dL (ref 30.0–36.0)
MCV: 88.9 fL (ref 78.0–100.0)
MONOS PCT: 7 %
Monocytes Absolute: 0.8 10*3/uL (ref 0.1–1.0)
Neutro Abs: 9.6 10*3/uL — ABNORMAL HIGH (ref 1.7–7.7)
Neutrophils Relative %: 78 %
Platelets: 239 10*3/uL (ref 150–400)
RBC: 3.5 MIL/uL — ABNORMAL LOW (ref 4.22–5.81)
RDW: 16.5 % — ABNORMAL HIGH (ref 11.5–15.5)
WBC: 12.4 10*3/uL — ABNORMAL HIGH (ref 4.0–10.5)

## 2016-08-04 LAB — RESPIRATORY PANEL BY PCR
ADENOVIRUS-RVPPCR: NOT DETECTED
Bordetella pertussis: NOT DETECTED
CHLAMYDOPHILA PNEUMONIAE-RVPPCR: NOT DETECTED
CORONAVIRUS HKU1-RVPPCR: NOT DETECTED
CORONAVIRUS NL63-RVPPCR: NOT DETECTED
Coronavirus 229E: NOT DETECTED
Coronavirus OC43: NOT DETECTED
INFLUENZA A-RVPPCR: NOT DETECTED
Influenza B: NOT DETECTED
MYCOPLASMA PNEUMONIAE-RVPPCR: NOT DETECTED
Metapneumovirus: NOT DETECTED
Parainfluenza Virus 1: NOT DETECTED
Parainfluenza Virus 2: NOT DETECTED
Parainfluenza Virus 3: NOT DETECTED
Parainfluenza Virus 4: NOT DETECTED
Respiratory Syncytial Virus: NOT DETECTED
Rhinovirus / Enterovirus: NOT DETECTED

## 2016-08-04 LAB — INFLUENZA PANEL BY PCR (TYPE A & B)
Influenza A By PCR: NEGATIVE
Influenza B By PCR: NEGATIVE

## 2016-08-04 LAB — MRSA PCR SCREENING: MRSA BY PCR: NEGATIVE

## 2016-08-04 LAB — COMPREHENSIVE METABOLIC PANEL
ALK PHOS: 60 U/L (ref 38–126)
ALT: 16 U/L — ABNORMAL LOW (ref 17–63)
ANION GAP: 10 (ref 5–15)
AST: 16 U/L (ref 15–41)
Albumin: 3.1 g/dL — ABNORMAL LOW (ref 3.5–5.0)
BUN: 9 mg/dL (ref 6–20)
CO2: 28 mmol/L (ref 22–32)
Calcium: 9.2 mg/dL (ref 8.9–10.3)
Chloride: 102 mmol/L (ref 101–111)
Creatinine, Ser: 0.65 mg/dL (ref 0.61–1.24)
Glucose, Bld: 178 mg/dL — ABNORMAL HIGH (ref 65–99)
Potassium: 4 mmol/L (ref 3.5–5.1)
Sodium: 140 mmol/L (ref 135–145)
TOTAL PROTEIN: 6.2 g/dL — AB (ref 6.5–8.1)
Total Bilirubin: 0.9 mg/dL (ref 0.3–1.2)

## 2016-08-04 LAB — I-STAT TROPONIN, ED: TROPONIN I, POC: 0.03 ng/mL (ref 0.00–0.08)

## 2016-08-04 LAB — I-STAT VENOUS BLOOD GAS, ED
Acid-Base Excess: 7 mmol/L — ABNORMAL HIGH (ref 0.0–2.0)
BICARBONATE: 32.4 mmol/L — AB (ref 20.0–28.0)
O2 Saturation: 96 %
PH VEN: 7.417 (ref 7.250–7.430)
TCO2: 34 mmol/L (ref 0–100)
pCO2, Ven: 50.2 mmHg (ref 44.0–60.0)
pO2, Ven: 80 mmHg — ABNORMAL HIGH (ref 32.0–45.0)

## 2016-08-04 LAB — BRAIN NATRIURETIC PEPTIDE: B NATRIURETIC PEPTIDE 5: 367.4 pg/mL — AB (ref 0.0–100.0)

## 2016-08-04 LAB — GLUCOSE, CAPILLARY
GLUCOSE-CAPILLARY: 147 mg/dL — AB (ref 65–99)
Glucose-Capillary: 148 mg/dL — ABNORMAL HIGH (ref 65–99)

## 2016-08-04 LAB — CBG MONITORING, ED: Glucose-Capillary: 157 mg/dL — ABNORMAL HIGH (ref 65–99)

## 2016-08-04 LAB — I-STAT CG4 LACTIC ACID, ED
LACTIC ACID, VENOUS: 1.73 mmol/L (ref 0.5–1.9)
LACTIC ACID, VENOUS: 1.23 mmol/L (ref 0.5–1.9)

## 2016-08-04 LAB — PROCALCITONIN

## 2016-08-04 MED ORDER — ALBUTEROL SULFATE (2.5 MG/3ML) 0.083% IN NEBU
2.5000 mg | INHALATION_SOLUTION | Freq: Three times a day (TID) | RESPIRATORY_TRACT | Status: DC
  Administered 2016-08-04 – 2016-08-15 (×30): 2.5 mg via RESPIRATORY_TRACT
  Filled 2016-08-04 (×34): qty 3

## 2016-08-04 MED ORDER — PIPERACILLIN-TAZOBACTAM 3.375 G IVPB 30 MIN
3.3750 g | Freq: Once | INTRAVENOUS | Status: AC
  Administered 2016-08-04: 3.375 g via INTRAVENOUS
  Filled 2016-08-04: qty 50

## 2016-08-04 MED ORDER — VANCOMYCIN HCL 10 G IV SOLR
1250.0000 mg | Freq: Once | INTRAVENOUS | Status: AC
  Administered 2016-08-04: 1250 mg via INTRAVENOUS
  Filled 2016-08-04: qty 1250

## 2016-08-04 MED ORDER — LATANOPROST 0.005 % OP SOLN
1.0000 [drp] | Freq: Every day | OPHTHALMIC | Status: DC
  Administered 2016-08-04 – 2016-08-14 (×11): 1 [drp] via OPHTHALMIC
  Filled 2016-08-04: qty 2.5

## 2016-08-04 MED ORDER — FUROSEMIDE 10 MG/ML IJ SOLN
60.0000 mg | Freq: Two times a day (BID) | INTRAMUSCULAR | Status: DC
  Administered 2016-08-04 – 2016-08-06 (×5): 60 mg via INTRAVENOUS
  Filled 2016-08-04 (×5): qty 6

## 2016-08-04 MED ORDER — FUROSEMIDE 10 MG/ML IJ SOLN
40.0000 mg | Freq: Once | INTRAMUSCULAR | Status: AC
  Administered 2016-08-04: 40 mg via INTRAVENOUS
  Filled 2016-08-04: qty 4

## 2016-08-04 MED ORDER — ACETAMINOPHEN 325 MG PO TABS: 650.0000 mg | ORAL_TABLET | ORAL | Status: DC | PRN

## 2016-08-04 MED ORDER — ONDANSETRON HCL 4 MG/2ML IJ SOLN: 4.0000 mg | Freq: Four times a day (QID) | INTRAMUSCULAR | Status: DC | PRN

## 2016-08-04 MED ORDER — ORAL CARE MOUTH RINSE
15.0000 mL | Freq: Two times a day (BID) | OROMUCOSAL | Status: DC
  Administered 2016-08-04 – 2016-08-15 (×17): 15 mL via OROMUCOSAL

## 2016-08-04 MED ORDER — DOXAZOSIN MESYLATE 4 MG PO TABS
4.0000 mg | ORAL_TABLET | Freq: Every day | ORAL | Status: DC
Start: 2016-08-04 — End: 2016-08-15
  Administered 2016-08-04 – 2016-08-15 (×12): 4 mg via ORAL
  Filled 2016-08-04 (×12): qty 1

## 2016-08-04 MED ORDER — SODIUM CHLORIDE 0.9% FLUSH
3.0000 mL | Freq: Two times a day (BID) | INTRAVENOUS | Status: DC
  Administered 2016-08-04 – 2016-08-05 (×3): 3 mL via INTRAVENOUS

## 2016-08-04 MED ORDER — INSULIN ASPART 100 UNIT/ML ~~LOC~~ SOLN
0.0000 [IU] | Freq: Three times a day (TID) | SUBCUTANEOUS | Status: DC
  Administered 2016-08-04: 1 [IU] via SUBCUTANEOUS
  Administered 2016-08-05: 3 [IU] via SUBCUTANEOUS
  Administered 2016-08-05: 1 [IU] via SUBCUTANEOUS
  Administered 2016-08-05 – 2016-08-06 (×3): 2 [IU] via SUBCUTANEOUS
  Administered 2016-08-06: 3 [IU] via SUBCUTANEOUS
  Administered 2016-08-07: 1 [IU] via SUBCUTANEOUS
  Administered 2016-08-07: 2 [IU] via SUBCUTANEOUS
  Administered 2016-08-07: 1 [IU] via SUBCUTANEOUS
  Administered 2016-08-08 (×2): 2 [IU] via SUBCUTANEOUS
  Administered 2016-08-09 (×2): 1 [IU] via SUBCUTANEOUS
  Administered 2016-08-09 – 2016-08-10 (×3): 2 [IU] via SUBCUTANEOUS
  Administered 2016-08-11: 1 [IU] via SUBCUTANEOUS
  Administered 2016-08-11 (×2): 2 [IU] via SUBCUTANEOUS
  Administered 2016-08-12: 3 [IU] via SUBCUTANEOUS
  Administered 2016-08-12 – 2016-08-15 (×9): 2 [IU] via SUBCUTANEOUS
  Administered 2016-08-15: 3 [IU] via SUBCUTANEOUS

## 2016-08-04 MED ORDER — SODIUM CHLORIDE 0.9% FLUSH: 3.0000 mL | INTRAVENOUS | Status: DC | PRN

## 2016-08-04 MED ORDER — SIMVASTATIN 20 MG PO TABS
20.0000 mg | ORAL_TABLET | Freq: Every day | ORAL | Status: DC
  Administered 2016-08-04 – 2016-08-14 (×11): 20 mg via ORAL
  Filled 2016-08-04 (×11): qty 1

## 2016-08-04 MED ORDER — SODIUM CHLORIDE 0.9 % IV SOLN
1250.0000 mg | Freq: Once | INTRAVENOUS | Status: DC
  Filled 2016-08-04: qty 1250

## 2016-08-04 MED ORDER — INSULIN ASPART 100 UNIT/ML ~~LOC~~ SOLN: 0.0000 [IU] | Freq: Every day | SUBCUTANEOUS | Status: DC

## 2016-08-04 MED ORDER — APIXABAN 2.5 MG PO TABS
2.5000 mg | ORAL_TABLET | Freq: Once | ORAL | Status: AC
  Administered 2016-08-04: 2.5 mg via ORAL
  Filled 2016-08-04: qty 1

## 2016-08-04 MED ORDER — MOMETASONE FUROATE 220 MCG/INH IN AEPB: 2.0000 | INHALATION_SPRAY | Freq: Every day | RESPIRATORY_TRACT | Status: DC

## 2016-08-04 MED ORDER — VANCOMYCIN HCL IN DEXTROSE 1-5 GM/200ML-% IV SOLN
1000.0000 mg | Freq: Two times a day (BID) | INTRAVENOUS | Status: DC
  Administered 2016-08-05 – 2016-08-06 (×3): 1000 mg via INTRAVENOUS
  Filled 2016-08-04 (×4): qty 200

## 2016-08-04 MED ORDER — PIPERACILLIN-TAZOBACTAM 3.375 G IVPB: 3.3750 g | Freq: Three times a day (TID) | INTRAVENOUS | Status: DC

## 2016-08-04 MED ORDER — INSULIN ASPART 100 UNIT/ML ~~LOC~~ SOLN: 0.0000 [IU] | SUBCUTANEOUS | Status: DC

## 2016-08-04 MED ORDER — APIXABAN 2.5 MG PO TABS: 2.5000 mg | ORAL_TABLET | Freq: Two times a day (BID) | ORAL | Status: DC

## 2016-08-04 MED ORDER — VANCOMYCIN HCL 10 G IV SOLR
1750.0000 mg | Freq: Once | INTRAVENOUS | Status: AC
  Administered 2016-08-04: 1750 mg via INTRAVENOUS
  Filled 2016-08-04: qty 1750

## 2016-08-04 MED ORDER — NITROGLYCERIN 0.4 MG SL SUBL
0.4000 mg | SUBLINGUAL_TABLET | Freq: Once | SUBLINGUAL | Status: AC
  Administered 2016-08-04: 0.4 mg via SUBLINGUAL
  Filled 2016-08-04: qty 1

## 2016-08-04 MED ORDER — METOPROLOL SUCCINATE ER 25 MG PO TB24
25.0000 mg | ORAL_TABLET | Freq: Every day | ORAL | Status: DC
  Administered 2016-08-04 – 2016-08-15 (×12): 25 mg via ORAL
  Filled 2016-08-04 (×12): qty 1

## 2016-08-04 MED ORDER — LISINOPRIL 20 MG PO TABS
40.0000 mg | ORAL_TABLET | Freq: Every day | ORAL | Status: DC
  Administered 2016-08-04 – 2016-08-05 (×2): 40 mg via ORAL
  Filled 2016-08-04 (×2): qty 2

## 2016-08-04 MED ORDER — SODIUM CHLORIDE 0.9 % IV SOLN: 250.0000 mL | INTRAVENOUS | Status: DC | PRN

## 2016-08-04 MED ORDER — ESCITALOPRAM OXALATE 10 MG PO TABS
10.0000 mg | ORAL_TABLET | Freq: Every day | ORAL | Status: DC
  Administered 2016-08-04 – 2016-08-15 (×12): 10 mg via ORAL
  Filled 2016-08-04 (×12): qty 1

## 2016-08-04 MED ORDER — DEXTROSE 5 % IV SOLN
1.0000 g | Freq: Three times a day (TID) | INTRAVENOUS | Status: DC
  Administered 2016-08-04 – 2016-08-06 (×6): 1 g via INTRAVENOUS
  Filled 2016-08-04 (×7): qty 1

## 2016-08-04 NOTE — Progress Notes (Signed)
Pharmacy Antibiotic Note  Kyle [name] is a 83 y.o. male admitted on 08/04/2016 with pneumonia.  Pharmacy has been consulted for vancomycin and cefepime dosing. He has remained stable off BiPAP on 3L oxygen -CT chest 2/18 shows airspace consolidation in LLL -WBC 12.4, Afebrile  Plan: -Vanc 1750mg  x1, then 1g q12h -Cefepime 1g q8h -Monitor cultures, renal function, LOT  Height: 5' 10.5" (179.1 cm) Weight: 171 lb 9.6 oz (77.8 kg) IBW/kg (Calculated) : 74.15  Temp (24hrs), Avg:97.5 F (36.4 C), Min:96.7 F (35.9 C), Max:98.2 F (36.8 C)   Recent Labs Lab 08/04/16 0708 08/04/16 0745 08/04/16 1107  WBC 12.4*  --   --   CREATININE 0.65  --   --   LATICACIDVEN  --  1.73 1.23    Estimated Creatinine Clearance: 67 mL/min (by C-G formula based on SCr of 0.65 mg/dL).    No Known Allergies  Antimicrobials this admission: Vanc 2/18 >> Cefepime 2/18 >>  Dose adjustments this admission: none  Microbiology results: 2/18 Sputum sent 2/18 MRSA PCR sent 2/18 respiratory panel: negative 2/18 BCx: sent  Thank you for allowing pharmacy to be a part of this patient's care.  Kyle KaufmanKai Beva Remund Bernette [name](Kyle [name]), PharmD  PGY1 Pharmacy Resident Pager: [phone] 08/04/2016 2:20 PM

## 2016-08-04 NOTE — Discharge Instructions (Signed)
Apixaban oral tablets °What is this medicine? °APIXABAN (a PIX a ban) is an anticoagulant (blood thinner). It is used to lower the chance of stroke in people with a medical condition called atrial fibrillation. It is also used to treat or prevent blood clots in the lungs or in the veins. °COMMON BRAND NAME(S): Eliquis °What should I tell my health care provider before I take this medicine? °They need to know if you have any of these conditions: °-bleeding disorders °-bleeding in the brain °-blood in your stools (black or tarry stools) or if you have blood in your vomit °-history of stomach bleeding °-kidney disease °-liver disease °-mechanical heart valve °-an unusual or allergic reaction to apixaban, other medicines, foods, dyes, or preservatives °-pregnant or trying to get pregnant °-breast-feeding °How should I use this medicine? °Take this medicine by mouth with a glass of water. Follow the directions on the prescription label. You can take it with or without food. If it upsets your stomach, take it with food. Take your medicine at regular intervals. Do not take it more often than directed. Do not stop taking except on your doctor's advice. Stopping this medicine may increase your risk of a blot clot. Be sure to refill your prescription before you run out of medicine. °Talk to your pediatrician regarding the use of this medicine in children. Special care may be needed. °What if I miss a dose? °If you miss a dose, take it as soon as you can. If it is almost time for your next dose, take only that dose. Do not take double or extra doses. °What may interact with this medicine? °This medicine may interact with the following: °-aspirin and aspirin-like medicines °-certain medicines for fungal infections like ketoconazole and itraconazole °-certain medicines for seizures like carbamazepine and phenytoin °-certain medicines that treat or prevent blood clots like warfarin, enoxaparin, and  dalteparin °-clarithromycin °-NSAIDs, medicines for pain and inflammation, like ibuprofen or naproxen °-rifampin °-ritonavir °-St. John's wort °What should I watch for while using this medicine? °Notify your doctor or health care professional and seek emergency treatment if you develop breathing problems; changes in vision; chest pain; severe, sudden headache; pain, swelling, warmth in the leg; trouble speaking; sudden numbness or weakness of the face, arm, or leg. These can be signs that your condition has gotten worse. °If you are going to have surgery, tell your doctor or health care professional that you are taking this medicine. °Tell your health care professional that you use this medicine before you have a spinal or epidural procedure. Sometimes people who take this medicine have bleeding problems around the spine when they have a spinal or epidural procedure. This bleeding is very rare. If you have a spinal or epidural procedure while on this medicine, call your health care professional immediately if you have back pain, numbness or tingling (especially in your legs and feet), muscle weakness, paralysis, or loss of bladder or bowel control. °Avoid sports and activities that might cause injury while you are using this medicine. Severe falls or injuries can cause unseen bleeding. Be careful when using sharp tools or knives. Consider using an electric razor. Take special care brushing or flossing your teeth. Report any injuries, bruising, or red spots on the skin to your doctor or health care professional. °What side effects may I notice from receiving this medicine? °Side effects that you should report to your doctor or health care professional as soon as possible: °-allergic reactions like skin rash, itching or hives, swelling   of the face, lips, or tongue °-signs and symptoms of bleeding such as bloody or black, tarry stools; red or dark-brown urine; spitting up blood or brown material that looks like coffee  grounds; red spots on the skin; unusual bruising or bleeding from the eye, gums, or nose °Where should I keep my medicine? °Keep out of the reach of children. °Store at room temperature between 20 and 25 degrees C (68 and 77 degrees F). Throw away any unused medicine after the expiration date. °© 2017 [employer]vier/Gold Standard (2015-07-06 09:26:49) ° °

## 2016-08-04 NOTE — ED Notes (Signed)
Patient removed from BIPAP and placed on oxygen at 3L n/c. Patient in no distress, alert and oriented

## 2016-08-04 NOTE — ED Triage Notes (Signed)
[facility] with increasing shortness of breath since last night. Patient arrived on C-PAP and received 1 sl ntg by EMS. [facility]. On arrival patient alert and oriented, no acute distress, rales noted bilaterally.

## 2016-08-04 NOTE — Progress Notes (Signed)
Patient resting comfortably on 3L [place] with no respiratory distress noted. BIPAP not needed at this time. RT will monitor as needed. 

## 2016-08-04 NOTE — ED Notes (Signed)
CBG 157 

## 2016-08-04 NOTE — H&P (Signed)
History and Physical    Kyle [name] ZOX:[id number] DOB: [birth year] DOA: 08/04/2016   PCP: Rogelia Boga, MD   Patient coming from/Resides with: Skilled nursing facility  Admission status: Inpatient/SDU-medically necessary to stay a minimum 2 midnights to rule out impending and/or unexpected changes in physiologic status that may differ from initial evaluation performed in the ER and/or at time of admission. Presents with acute respiratory care with hypoxia requiring BiPAP upon presentation, found to have CHF exacerbation with concomitant COPD exacerbation, chest x-ray with new right pleural effusion of uncertain etiology. Will require telemetry monitoring, close monitoring of hemodynamic status and setting of aggressive IV diuresis, strict intake and output, CT chest to evaluate pleural effusion in the event diagnostic, therapeutic thoracentesis indicated  Chief Complaint: Shortness of breath and hypoxemia  HPI: Kyle [name] is a 83 y.o. male with medical history significant for atrial fibrillation on eliquis, history of trifascicular block and subsequent pacemaker placement, dyslipidemia, sleep apnea, hypertension, diabetes, known grade 1 diastolic dysfunction, pulmonary hypertension and COPD. History primarily obtained from patient's daughter as well as from Epic and nursing home records. According to the daughter, for the past 3.5 months the patient has had recurrent respiratory symptoms requiring frequent visits to his PCP, the ER or other urgent care facilities. Initially patient was living at home but due to severity of symptoms and deconditioning had to episodic rehabilitation visits but has subsequently been placed in a skilled nursing facility. He has undergone a swallowing evaluations at the [facility] rehabilitation facility. The daughter notes that each episode of respiratory failure has been preceded by edema and either the hands or the feet. For the past 1 week patient  has been having respiratory symptoms requiring placement of oxygen. According to the daughter, yesterday (2/17) he appeared to be stable without any overt congestion or respiratory distress. According to EMS report, patient developed progressive shortness of breath overnight with increasing audible congestion. Vital signs at time of transfer from nursing facility as follows: Temperature 97.3, BP 130/70, pulse 70, respirations 32, despite oxygen O2 saturations were 67%. After arrival of EMS and CPAP applied O2 saturations increased to 92%. Because of increasing blood pressure upon EMS arrival patient was given one sublingual nitroglycerin. The nursing facility had also attempted an albuterol treatment without improvement in patient's symptoms.  ED Course:  Vital Signs: BP 174/76   Pulse 69   Temp (!) 96.7 F (35.9 C) (Axillary)   Resp 20   Wt 73.9 kg (163 lb)   SpO2 99%   BMI 24.78 kg/m  PCXR: Moderate to large right pleural effusion with atelectasis, a degree of pulmonary edema throughout right lung although infection not completely excluded by radiologist Lab data: Sodium 140, potassium 4.0, chloride 102, CO2 28, glucose 178, BUN 9, creatinine 0.65, albumin 3.1, LFTs not elevated, BNP 367, poc troponin 0.03, lactic acid 1.73, Procalcitonin less than 0.10, white count 12,400 with neutrophil 70% and absolute neutrophils 9.6%, hemoglobin 9.6, platelets 239,000, blood cultures obtained in the ER, influenza A/B PCR negative Medications and treatments: BiPAP, vancomycin 1250 mg IV 1, nitroglycerin 0.4 mg SL 1, Lasix 40 mg IV 1, Zosyn 3.375 g IV 1  Review of Systems:  In addition to the HPI above,  No Fever-chills, myalgias or other constitutional symptoms reported by nursing facility or patient No Headache, changes with Vision or hearing, new weakness, tingling, numbness in any extremity, dizziness, dysarthria or word finding difficulty, gait disturbance or imbalance, tremors or seizure  activity No problems swallowing food  or Liquids, indigestion/reflux, choking or coughing while eating, abdominal pain with or after eating No Chest pain, palpitations, orthopnea or DOE (nonambulatory) No Abdominal pain, N/V, melena,hematochezia, dark tarry stools, constipation No dysuria, malodorous urine, hematuria or flank pain No new skin rashes, lesions, masses or bruises, No new joint pains, aches or redness No recent unintentional weight gain or loss No polyuria, polydypsia or polyphagia   Past Medical History:  Diagnosis Date  . Benign prostatic hypertrophy   . CHF (congestive heart failure) (HCC)   . COPD (chronic obstructive pulmonary disease) (HCC)   . Diabetes mellitus   . Elevated PSA   . Glaucoma   . Hyperlipidemia   . Hypertension   . Intermittent complete heart block (HCC)   . Pacemaker   . PAF (paroxysmal atrial fibrillation) (HCC)    Detected on pacemaker; the duration 44 seconds  . Trifascicular block     Past Surgical History:  Procedure Laterality Date  . CATARACT EXTRACTION, BILATERAL    . PERMANENT PACEMAKER INSERTION N/A 08/24/2012   Procedure: PERMANENT PACEMAKER INSERTION;  Surgeon: Duke Salvia, MD;  Location: [facility] CATH LAB;  Service: Cardiovascular;  Laterality: N/A;  . TONSILLECTOMY      Social History   Social History  . Marital status: Married    Spouse name: N/A  . Number of children: 4  . Years of education: N/A   Occupational History  . Not on file.   Social History Main Topics  . Smoking status: Former Smoker    Packs/day: 0.50    Years: 71.00    Types: Cigarettes    Quit date: 04/08/2016  . Smokeless tobacco: Never Used  . Alcohol use 1.8 oz/week    1 Glasses of wine, 2 Shots of liquor per week     Comment: bourbon, 2 drinks a day  . Drug use: No  . Sexual activity: Not on file   Other Topics Concern  . Not on file   Social History Narrative   Lives at home with wife   Currently at rehab at [facility]   4 children,  oldest (daughter) helps the most Misty Stanley, who is the primary contact person)   OCCUPATION: retired, business Teacher, English as a foreign language: Confined to wheelchair for the past 1-2 months Work history: Retired   No Known Allergies  Family History  Problem Relation Age of Onset  . Heart disease Mother     atrial fib  . Heart disease Father 46    MI  . Cancer Father     prostate     Prior to Admission medications   Medication Sig Start Date End Date Taking? Authorizing Provider  albuterol (PROVENTIL) (2.5 MG/3ML) 0.083% nebulizer solution Take 2.5 mg by nebulization 3 (three) times daily.  04/25/16   Historical Provider, MD  albuterol (PROVENTIL,VENTOLIN) 90 MCG/ACT inhaler Inhale 2 puffs into the lungs 2 (two) times daily.      Historical Provider, MD  apixaban (ELIQUIS) 2.5 MG TABS tablet Take 1 tablet (2.5 mg total) by mouth 2 (two) times daily. 11/23/15   Newman Nip, NP  ASMANEX 30 METERED DOSES 220 MCG/INH inhaler Inhale 2 puffs into the lungs daily.  04/10/16   Historical Provider, MD  doxazosin (CARDURA) 4 MG tablet TAKE 1 TABLET EACH DAY. 03/07/16   Gordy Savers, MD  doxycycline (VIBRAMYCIN) 100 MG capsule Take 1 capsule (100 mg total) by mouth 2 (two) times daily. 06/13/16   [name], MD  escitalopram (LEXAPRO) 10  MG tablet Take 10 mg by mouth daily.    Historical Provider, MD  furosemide (LASIX) 40 MG tablet 1 half tablet daily as needed for fluid control 05/14/16   Gordy Savers, MD  glucose blood (PRECISION XTRA TEST STRIPS) test strip 1 each by Other route daily as needed for other. Use as instructed 06/24/13   Gordy Savers, MD  latanoprost (XALATAN) 0.005 % ophthalmic solution 1 drop at bedtime.    Historical Provider, MD  lisinopril (PRINIVIL,ZESTRIL) 40 MG tablet TAKE 1 TABLET ONCE DAILY. 08/17/15   Gordy Savers, MD  metFORMIN (GLUCOPHAGE-XR) 500 MG 24 hr tablet Take 2 tablets (1,000 mg total) by mouth daily with breakfast. Patient taking  differently: Take 2,000 mg by mouth daily with breakfast.  05/14/16   Gordy Savers, MD  metoprolol succinate (TOPROL-XL) 50 MG 24 hr tablet Take 1/2 tablet by mouth daily (25mg ) 11/02/15   Newman Nip, NP  simvastatin (ZOCOR) 20 MG tablet TAKE ONE TABLET AT BEDTIME. 08/17/15   Gordy Savers, MD    Physical Exam: Vitals:   08/04/16 0845 08/04/16 0900 08/04/16 0930 08/04/16 1000  BP: 145/70 135/71 165/79 174/76  Pulse: 69 68 69 69  Resp: 16 (!) 28 18 20   Temp:      TempSrc:      SpO2: 98% 96% 100% 99%  Weight:          Constitutional: NAD, calm, comfortable Eyes: PERRL, lids and conjunctivae normal ENMT: Mucous membranes are moist. Posterior pharynx clear of any exudate or lesions.Normal dentition.  Neck: normal, supple, no masses, no thyromegaly Respiratory: Very coarse to auscultation with expiratory rhonchi bilaterally decreased in the right base, no wheezing. Normal respiratory effort without accessory muscle use on BiPAP.  Cardiovascular: Regular rate and rhythm, no murmurs / rubs / gallops. 1+ bilateral lower extremity edema. 2+ pedal pulses. No carotid bruits.  Abdomen: no tenderness, no masses palpated. No hepatosplenomegaly. Bowel sounds positive.  Musculoskeletal: no clubbing / cyanosis. No joint deformity upper and lower extremities. Good ROM, no contractures. Normal muscle tone.  Skin: no rashes, lesions, ulcers. No induration Neurologic: CN 2-12 grossly intact. Sensation intact, DTR normal. Strength 5/5 x all 4 extremities.  Psychiatric: Alert and oriented x 3. Normal mood.    Labs on Admission: I have personally reviewed following labs and imaging studies  CBC:  Recent Labs Lab 08/04/16 0708  WBC 12.4*  NEUTROABS 9.6*  HGB 9.6*  HCT 31.1*  MCV 88.9  PLT 239   Basic Metabolic Panel:  Recent Labs Lab 08/04/16 0708  NA 140  K 4.0  CL 102  CO2 28  GLUCOSE 178*  BUN 9  CREATININE 0.65  CALCIUM 9.2   GFR: Estimated Creatinine Clearance:  61.8 mL/min (by C-G formula based on SCr of 0.65 mg/dL). Liver Function Tests:  Recent Labs Lab 08/04/16 0708  AST 16  ALT 16*  ALKPHOS 60  BILITOT 0.9  PROT 6.2*  ALBUMIN 3.1*   No results for input(s): LIPASE, AMYLASE in the last 168 hours. No results for input(s): AMMONIA in the last 168 hours. Coagulation Profile: No results for input(s): INR, PROTIME in the last 168 hours. Cardiac Enzymes: No results for input(s): CKTOTAL, CKMB, CKMBINDEX, TROPONINI in the last 168 hours. BNP (last 3 results)  Recent Labs  04/05/16 1102  PROBNP 367.0*   HbA1C: No results for input(s): HGBA1C in the last 72 hours. CBG:  Recent Labs Lab 08/04/16 0722  GLUCAP 157*   Lipid Profile:  No results for input(s): CHOL, HDL, LDLCALC, TRIG, CHOLHDL, LDLDIRECT in the last 72 hours. Thyroid Function Tests: No results for input(s): TSH, T4TOTAL, FREET4, T3FREE, THYROIDAB in the last 72 hours. Anemia Panel: No results for input(s): VITAMINB12, FOLATE, FERRITIN, TIBC, IRON, RETICCTPCT in the last 72 hours. Urine analysis:    Component Value Date/Time   COLORURINE AMBER (A) 06/12/2016 1748   APPEARANCEUR HAZY (A) 06/12/2016 1748   LABSPEC 1.027 06/12/2016 1748   PHURINE 5.0 06/12/2016 1748   GLUCOSEU NEGATIVE 06/12/2016 1748   HGBUR NEGATIVE 06/12/2016 1748   HGBUR negative 04/05/2009 0856   BILIRUBINUR NEGATIVE 06/12/2016 1748   BILIRUBINUR n 09/20/2015 0840   KETONESUR NEGATIVE 06/12/2016 1748   PROTEINUR 100 (A) 06/12/2016 1748   UROBILINOGEN 0.2 09/20/2015 0840   UROBILINOGEN 0.2 04/05/2009 0856   NITRITE NEGATIVE 06/12/2016 1748   LEUKOCYTESUR NEGATIVE 06/12/2016 1748   Sepsis Labs: @LABRCNTIP (procalcitonin:4,lacticidven:4) )No results found for this or any previous visit (from the past 240 hour(s)).   Radiological Exams on Admission: Dg Chest Port 1 View  Result Date: 08/04/2016 CLINICAL DATA:  Severe shortness of breath since this morning, fever, hypertension, diabetes  mellitus, COPD, paroxysmal atrial fibrillation EXAM: PORTABLE CHEST 1 VIEW COMPARISON:  Portable exam 0726 hours compared to 06/12/2016 FINDINGS: LEFT subclavian sequential pacemaker with leads projecting over RIGHT atrium and RIGHT ventricle unchanged. Enlargement of cardiac silhouette with pulmonary vascular congestion. Atherosclerotic calcification aorta. Moderate to large RIGHT pleural effusion with basilar atelectasis. BILATERAL interstitial infiltrates slightly increased question pulmonary edema though infection in RIGHT lung not excluded. No pneumothorax. Bones demineralized. IMPRESSION: Enlargement of cardiac silhouette post pacemaker with pulmonary vascular congestion and BILATERAL pulmonary infiltrates favor pulmonary edema as above. Moderate to large RIGHT pleural effusion. Aortic atherosclerosis. Electronically Signed   By: Ulyses Southward M.D.   On: 08/04/2016 07:37    EKG: (Independently reviewed) Atrial fibrillation with underlying right bundle branch block and associated LVH, ventricular rate 69 these per minute QTC 510 ms in setting of bundle branch block  Assessment/Plan Principal Problem:   Acute respiratory failure with hypoxia  -Patient presents with documented hypoxemia that appears to be progressive over at least 1 week worsened in the past 24 hours with new findings of likely right-sided edema and significant right pleural effusion but infection not excluded -Continue supportive care with BiPAP and wean as tolerated -Treat underlying symptoms -see problems below  Active Problems:   COPD with acute exacerbation /?? HCAP -Currently not wheezing and symptoms appear more consistent with CHF exacerbation -Continue BiPAP with weaning as noted above -Continue preadmission nebulizers -Procalcitonin negative and influenza PCR negative but for completeness of exam will continue to treat as possible HCAP (especially with pleural effusion of uncertain etiology) vs COPD bronchitis and can  utilize CT of chest without contrast and follow-up two-view chest x-ray in a.m. to determine if appropriate to discontinue antibiotics -Follow up on blood cultures -Sputum culture -Urinary legionella and strep -HIV    Pleural effusion, right -Uncertain if related to volume overload (transudative) versus infectious (exudative) -Follow up on CT chest -Given size of effusion, if does not improve after diuresis consider IR consultation for diagnostic/therapeutic thoracentesis (patient on eliquis so this will need to be held prior to procedure)    Acute diastolic heart failure/pulmonary hypertension  -Last echocardiogram in 2014: grade 1 diastolic dysfunction as well as moderate pulmonary hypertension 52 mmHg but otherwise preserved LV function -Echocardiogram this admission -Daily weights/strict intake and output (condom catheter) -Continue beta blocker and ACE inhibitor -  Lasix 40 mg by mouth daily at home and will increase to Lasix 60 mg IV every 12 hours -Based on echo results and history of recurrent respiratory symptoms that seem related to volume overload patient may require adjustments in preadmission cardiac medications    Diabetes mellitus due to underlying condition with diabetic polyneuropathy  -Hold preadmission metformin -SSI -Hemoglobin A1c    Essential hypertension -Continue Cardura, lisinopril and Toprol -Lasix as above    Atrial fibrillation  -Currently rate controlled on beta blocker -Continue eliquis    Dyslipidemia -Continue Zocor    Obstructive sleep apnea -Continue BiPAP -Does not appear to be on nocturnal CPAP at facility      DVT prophylaxis: Eliquis Code Status: DO NOT RESUSCITATE (confirmed with daughter despite nursing home documentation of full status-daughter made aware and will dress with nursing home) Family Communication: Daughter at bedside  Disposition Plan: When medically stable return to nursing facility Consults called:  None    Birdie Beveridge L. ANP-BC [facility] Pager [phone]   If 7PM-7AM, please contact night-coverage [email or web address] Password [email or web address]  08/04/2016, 10:21 AM

## 2016-08-04 NOTE — ED Notes (Signed)
Admitting MD Provider at bedside. 

## 2016-08-04 NOTE — Progress Notes (Signed)
RT note-Patient transferred up from ED without Bipap, on 3l/min [place]. Albuterol treatment given per order. BBS rhonchi throughout, moderate cough effort, flutter valve given and instructed, sp02 97% currently.

## 2016-08-04 NOTE — Progress Notes (Addendum)
CT chest demonstrates areas of airspace consolidation in the left lower lobe and inferior segment of the lingula concerning for multilobar pneumonia. In addition there is a large right and moderate left sided pleural effusion with associated extensive atelectasis of the entire right lower lobe and right middle lobe and dependent portions of the left lower lobe. Patient has not received eliquis yet today and has remained nothing by mouth. He also remained stable off BiPAP on 3 L oxygen so we'll transition to telemetry level of care. Order placed in IR to request thoracentesis with labs. If unable to complete today we'll begin diet and make NPO after MN. I will make nurse aware of need to hold eliquis for now until patient evaluated by interventional radiology. Based on CT findings that are concerning for PNA will continue empiric anbx's  1420: Discussed with RN on 3 W.. Interventional radiology not available for nonurgent procedures and consultations on the weekend therefore have ordered her to give a one-time dose of eliquis and then will discontinue until patient can be evaluated in the a.m. for thoracentesis procedure. Have ordered diet and will make patient NPO after midnight for likely procedure tomorrow  On 3W unit so can easily transition back to SDU status so will leave BiPAP as prn  Junious SilkAllison Ellis, ANP

## 2016-08-04 NOTE — ED Provider Notes (Signed)
[facility]-EMERGENCY DEPT Provider Note   CSN: [id number] Arrival date & time: 08/04/16  0706     History   Chief Complaint Chief Complaint  Patient presents with  . CPAP/Shortness of Breath    HPI Kyle [name] is a 83 y.o. male.  HPI 83 year old male with past medical history of hypertension, hyperlipidemia, diabetes, atrial fibrillation status post pacemaker placement who presents with shortness of breath. Patient reportedly has had intermittent shortness of breath since August. He states that over the last 2 days, he has had acute worsening of his shortness of breath with increasing cough. He denies any associated sputum production or fever or chills. Denies any associated chest pain or pressure. No lower extremity edema. On scene, he was given albuterol as well as placed on CPAP with minimal improvement. He was also notably hypertensive which improved with one nitroglycerin according to EMS. Otherwise, patient denies any other complaints. Denies any lightheadedness or dizziness. He does live and he skilled nursing facility with known recent sick contacts. Denies any myalgias or arthralgias.  Past Medical History:  Diagnosis Date  . Benign prostatic hypertrophy   . CHF (congestive heart failure) (HCC)   . COPD (chronic obstructive pulmonary disease) (HCC)   . Diabetes mellitus   . Elevated PSA   . Glaucoma   . Hyperlipidemia   . Hypertension   . Intermittent complete heart block (HCC)   . Pacemaker   . PAF (paroxysmal atrial fibrillation) (HCC)    Detected on pacemaker; the duration 44 seconds  . Trifascicular block     Patient Active Problem List   Diagnosis Date Noted  . Acute respiratory failure with hypoxia (HCC) 08/04/2016  . Pleural effusion, right 08/04/2016  . Acute diastolic heart failure (HCC) 08/04/2016  . Pulmonary HTN 08/04/2016  . Pneumonia 06/13/2016  . Depression 10/19/2015  . Atrial fibrillation (HCC) 11/25/2012  . Syncope 08/17/2012  . Trifascicular  block-RBBB+LAFB 08/17/2012  . Edema 08/17/2012  . Obstructive sleep apnea 08/17/2012  . PROSTATE SPECIFIC ANTIGEN, ELEVATED 01/10/2009  . COPD with acute exacerbation (HCC) 10/11/2008  . BENIGN PROSTATIC HYPERTROPHY 01/13/2008  . Dyslipidemia 01/07/2007  . Diabetes mellitus due to underlying condition with diabetic polyneuropathy (HCC) 12/24/2006  . GLAUCOMA 12/24/2006  . Essential hypertension 12/24/2006    Past Surgical History:  Procedure Laterality Date  . CATARACT EXTRACTION, BILATERAL    . PERMANENT PACEMAKER INSERTION N/A 08/24/2012   Procedure: PERMANENT PACEMAKER INSERTION;  Surgeon: Duke Salvia, MD;  Location: [facility] CATH LAB;  Service: Cardiovascular;  Laterality: N/A;  . TONSILLECTOMY         Home Medications    Prior to Admission medications   Medication Sig Start Date End Date Taking? Authorizing Provider  albuterol (PROVENTIL,VENTOLIN) 90 MCG/ACT inhaler Inhale 2 puffs into the lungs 2 (two) times daily.     Yes Historical Provider, MD  apixaban (ELIQUIS) 2.5 MG TABS tablet Take 1 tablet (2.5 mg total) by mouth 2 (two) times daily. 11/23/15  Yes Newman Nip, NP  doxazosin (CARDURA) 4 MG tablet TAKE 1 TABLET EACH DAY. 03/07/16  Yes Gordy Savers, MD  escitalopram (LEXAPRO) 10 MG tablet Take 10 mg by mouth daily.   Yes Historical Provider, MD  Fluticasone Propionate, Inhal, (FLOVENT DISKUS) 100 MCG/BLIST AEPB Inhale 1 puff into the lungs 2 (two) times daily.   Yes Historical Provider, MD  latanoprost (XALATAN) 0.005 % ophthalmic solution 1 drop at bedtime.   Yes Historical Provider, MD  lisinopril (PRINIVIL,ZESTRIL) 40 MG tablet TAKE  1 TABLET ONCE DAILY. 08/17/15  Yes Gordy Savers, MD  metFORMIN (GLUCOPHAGE-XR) 500 MG 24 hr tablet Take 1,500 mg by mouth daily with breakfast.   Yes Historical Provider, MD  metoprolol succinate (TOPROL-XL) 50 MG 24 hr tablet Take 1/2 tablet by mouth daily (25mg ) 11/02/15  Yes Newman Nip, NP  doxycycline (VIBRAMYCIN) 100 MG  capsule Take 1 capsule (100 mg total) by mouth 2 (two) times daily. Patient not taking: Reported on 08/04/2016 06/13/16   [name], MD  furosemide (LASIX) 40 MG tablet 1 half tablet daily as needed for fluid control 05/14/16   Gordy Savers, MD  glucose blood (PRECISION XTRA TEST STRIPS) test strip 1 each by Other route daily as needed for other. Use as instructed 06/24/13   Gordy Savers, MD    Family History Family History  Problem Relation Age of Onset  . Heart disease Mother     atrial fib  . Heart disease Father 6    MI  . Cancer Father     prostate    Social History Social History  Substance Use Topics  . Smoking status: Former Smoker    Packs/day: 0.50    Years: 71.00    Types: Cigarettes    Quit date: 04/08/2016  . Smokeless tobacco: Never Used  . Alcohol use 1.8 oz/week    1 Glasses of wine, 2 Shots of liquor per week     Comment: bourbon, 2 drinks a day     Allergies   Patient has no known allergies.   Review of Systems Review of Systems  Constitutional: Positive for fatigue. Negative for chills and fever.  HENT: Negative for congestion and rhinorrhea.   Eyes: Negative for visual disturbance.  Respiratory: Positive for cough and shortness of breath. Negative for wheezing.   Cardiovascular: Negative for chest pain and leg swelling.  Gastrointestinal: Negative for abdominal pain, diarrhea, nausea and vomiting.  Genitourinary: Negative for dysuria and flank pain.  Musculoskeletal: Negative for neck pain and neck stiffness.  Skin: Negative for rash and wound.  Allergic/Immunologic: Negative for immunocompromised state.  Neurological: Positive for weakness (Generalized). Negative for syncope and headaches.  All other systems reviewed and are negative.    Physical Exam Updated Vital Signs BP (!) 173/72 (BP Location: Right Arm)   Pulse 65   Temp 98.2 F (36.8 C) (Oral)   Resp 18   Ht 5' 10.5" (1.791 m)   Wt 171 lb 9.6 oz (77.8 kg)   SpO2  97%   BMI 24.27 kg/m   Physical Exam  Constitutional: He is oriented to person, place, and time. He appears well-developed and well-nourished. He appears distressed.  HENT:  Head: Normocephalic and atraumatic.  Eyes: Conjunctivae are normal.  Neck: Neck supple.  Cardiovascular: Normal rate, regular rhythm and normal heart sounds.  Exam reveals no friction rub.   No murmur heard. Pulmonary/Chest: Effort normal. Tachypnea noted. No respiratory distress. He has decreased breath sounds. He has no wheezes. He has rales in the right upper field, the right middle field, the right lower field, the left upper field, the left middle field and the left lower field.  Abdominal: Soft. He exhibits no distension.  Musculoskeletal: He exhibits no edema.  Neurological: He is alert and oriented to person, place, and time. He exhibits normal muscle tone.  Skin: Skin is warm. Capillary refill takes less than 2 seconds.  Psychiatric: He has a normal mood and affect.  Nursing note and vitals reviewed.  ED Treatments / Results  Labs (all labs ordered are listed, but only abnormal results are displayed) Labs Reviewed  CBC WITH DIFFERENTIAL/PLATELET - Abnormal; Notable for the following:       Result Value   WBC 12.4 (*)    RBC 3.50 (*)    Hemoglobin 9.6 (*)    HCT 31.1 (*)    RDW 16.5 (*)    Neutro Abs 9.6 (*)    All other components within normal limits  COMPREHENSIVE METABOLIC PANEL - Abnormal; Notable for the following:    Glucose, Bld 178 (*)    Total Protein 6.2 (*)    Albumin 3.1 (*)    ALT 16 (*)    All other components within normal limits  BRAIN NATRIURETIC PEPTIDE - Abnormal; Notable for the following:    B Natriuretic Peptide 367.4 (*)    All other components within normal limits  I-STAT VENOUS BLOOD GAS, ED - Abnormal; Notable for the following:    pO2, Ven 80.0 (*)    Bicarbonate 32.4 (*)    Acid-Base Excess 7.0 (*)    All other components within normal limits  CBG MONITORING,  ED - Abnormal; Notable for the following:    Glucose-Capillary 157 (*)    All other components within normal limits  RESPIRATORY PANEL BY PCR  CULTURE, BLOOD (ROUTINE X 2)  CULTURE, BLOOD (ROUTINE X 2)  MRSA PCR SCREENING  CULTURE, EXPECTORATED SPUTUM-ASSESSMENT  GRAM STAIN  PROCALCITONIN  INFLUENZA PANEL BY PCR (TYPE A & B)  HEMOGLOBIN A1C  HIV ANTIBODY (ROUTINE TESTING)  STREP PNEUMONIAE URINARY ANTIGEN  LEGIONELLA PNEUMOPHILA SEROGP 1 UR AG  I-STAT CG4 LACTIC ACID, ED  I-STAT TROPOININ, ED  I-STAT CG4 LACTIC ACID, ED    EKG  EKG Interpretation  Date/Time:  Sunday August 04 2016 07:11:08 EST Ventricular Rate:  69 PR Interval:    QRS Duration: 186 QT Interval:  476 QTC Calculation: 510 R Axis:   -79 Text Interpretation:  Sinus rhythm Right bundle branch block LVH with secondary repolarization abnormality Widened QRS Sincel ast EKG: Pacer spikes are less evident, but QRS complexes similar in morphology, likely ventricular paced rhythm No Sgarbossa criteria Confirmed by Erma Heritage MD, Sheria Lang [phone]) on 08/04/2016 7:14:42 AM       Radiology Ct Chest Wo Contrast  Result Date: 08/04/2016 CLINICAL DATA:  83 year old male with history of congestive heart failure. Large right pleural effusion. EXAM: CT CHEST WITHOUT CONTRAST TECHNIQUE: Multidetector CT imaging of the chest was performed following the standard protocol without IV contrast. COMPARISON:  No priors. FINDINGS: Cardiovascular: Heart size is mildly enlarged. Small amount of pericardial fluid and/or thickening, unlikely to be of hemodynamic significance at this time. There is aortic atherosclerosis, as well as atherosclerosis of the great vessels of the mediastinum and the coronary arteries, including calcified atherosclerotic plaque in the left main, left anterior descending, left circumflex and right coronary arteries. Mild calcifications of the aortic valve. Mild calcifications of the mitral annulus. Left-sided pacemaker  device with lead tips terminating in the right atrial appendage and right ventricular apex. Mediastinum/Nodes: No pathologically enlarged mediastinal or hilar lymph nodes. Please note that accurate exclusion of hilar adenopathy is limited on noncontrast CT scans. Esophagus is unremarkable in appearance. No axillary lymphadenopathy. Lungs/Pleura: Large right and moderate left-sided pleural effusions. This is associated with what appears to be passive atelectasis in the lungs. Specifically, the entire right lower lobe and right middle lobe are collapsed, and much of the dependent portions of the left lower  lobe also appear collapse. There does appear to be some patchy airspace consolidation, most evident in the periphery of the inferior segment of the lingula and in the remaining aerated portions of the left lower lobe where there are some air bronchograms, indicative of underlying pneumonia. Retained secretions are seen filling the bronchus intermedius largely occluding the bronchi to the right middle and lower lobes. Upper Abdomen: Aortic atherosclerosis. Trace volume of perihepatic ascites. Musculoskeletal: Old vertebral body compression fracture of T8 with approximately 80% loss of anterior vertebral body height. Old vertebral body compression fracture of T10 with approximately 10% loss of anterior vertebral body height. There are no aggressive appearing lytic or blastic lesions noted in the visualized portions of the skeleton. IMPRESSION: 1. Areas of airspace consolidation in the left lower lobe and inferior segment of the lingula, concerning for multilobar pneumonia. 2. Large right and moderate left-sided pleural effusions with associated extensive atelectasis in the entire right lower lobe and right middle lobe, and in the dependent portions of the left lower lobe. 3. Aortic atherosclerosis, in addition to left main and 3 vessel coronary artery disease. 4. There are calcifications of the aortic valve and mitral  annulus. Echocardiographic correlation for evaluation of potential valvular dysfunction may be warranted if clinically indicated. 5. Trace amount of pericardial fluid and/or thickening, unlikely to be of any hemodynamic significance at this time. 6. Trace volume of perihepatic ascites. 7. Additional incidental findings, as above. Electronically Signed   By: Trudie Reedaniel  Entrikin M.D.   On: 08/04/2016 12:24   Dg Chest Port 1 View  Result Date: 08/04/2016 CLINICAL DATA:  Severe shortness of breath since this morning, fever, hypertension, diabetes mellitus, COPD, paroxysmal atrial fibrillation EXAM: PORTABLE CHEST 1 VIEW COMPARISON:  Portable exam 0726 hours compared to 06/12/2016 FINDINGS: LEFT subclavian sequential pacemaker with leads projecting over RIGHT atrium and RIGHT ventricle unchanged. Enlargement of cardiac silhouette with pulmonary vascular congestion. Atherosclerotic calcification aorta. Moderate to large RIGHT pleural effusion with basilar atelectasis. BILATERAL interstitial infiltrates slightly increased question pulmonary edema though infection in RIGHT lung not excluded. No pneumothorax. Bones demineralized. IMPRESSION: Enlargement of cardiac silhouette post pacemaker with pulmonary vascular congestion and BILATERAL pulmonary infiltrates favor pulmonary edema as above. Moderate to large RIGHT pleural effusion. Aortic atherosclerosis. Electronically Signed   By: Ulyses SouthwardMark  Boles M.D.   On: 08/04/2016 07:37    Procedures .Critical Care Performed by: Shaune PollackISAACS, Dorraine Ellender Authorized by: Shaune PollackISAACS, Dalanie Kisner   Critical care provider statement:    Critical care time (minutes):  45   Critical care time was exclusive of:  Separately billable procedures and treating other patients   Critical care was necessary to treat or prevent imminent or life-threatening deterioration of the following conditions:  Cardiac failure, circulatory failure and respiratory failure   Critical care was time spent personally by me on  the following activities:  Blood draw for specimens, development of treatment plan with patient or surrogate, discussions with consultants, evaluation of patient's response to treatment, examination of patient, obtaining history from patient or surrogate, ordering and performing treatments and interventions, ordering and review of laboratory studies, ordering and review of radiographic studies, pulse oximetry, re-evaluation of patient's condition and review of old charts   I assumed direction of critical care for this patient from another provider in my specialty: no     (including critical care time)  Medications Ordered in ED Medications  doxazosin (CARDURA) tablet 4 mg (not administered)  lisinopril (PRINIVIL,ZESTRIL) tablet 40 mg (not administered)  metoprolol succinate (TOPROL-XL) 24 hr  tablet 25 mg (not administered)  escitalopram (LEXAPRO) tablet 10 mg (not administered)  albuterol (PROVENTIL) (2.5 MG/3ML) 0.083% nebulizer solution 2.5 mg (2.5 mg Nebulization Given 08/04/16 1442)  simvastatin (ZOCOR) tablet 20 mg (not administered)  latanoprost (XALATAN) 0.005 % ophthalmic solution 1 drop (not administered)  sodium chloride flush (NS) 0.9 % injection 3 mL (not administered)  sodium chloride flush (NS) 0.9 % injection 3 mL (not administered)  0.9 %  sodium chloride infusion (not administered)  acetaminophen (TYLENOL) tablet 650 mg (not administered)  ondansetron (ZOFRAN) injection 4 mg (not administered)  furosemide (LASIX) injection 60 mg (not administered)  ceFEPIme (MAXIPIME) 1 g in dextrose 5 % 50 mL IVPB (not administered)  vancomycin (VANCOCIN) 1,750 mg in sodium chloride 0.9 % 500 mL IVPB (not administered)  vancomycin (VANCOCIN) IVPB 1000 mg/200 mL premix (not administered)  insulin aspart (novoLOG) injection 0-9 Units (not administered)  insulin aspart (novoLOG) injection 0-5 Units (not administered)  MEDLINE mouth rinse (not administered)  apixaban (ELIQUIS) tablet 2.5 mg  (not administered)  nitroGLYCERIN (NITROSTAT) SL tablet 0.4 mg (0.4 mg Sublingual Given 08/04/16 0812)  furosemide (LASIX) injection 40 mg (40 mg Intravenous Given 08/04/16 0812)  piperacillin-tazobactam (ZOSYN) IVPB 3.375 g (0 g Intravenous Stopped 08/04/16 0850)  vancomycin (VANCOCIN) 1,250 mg in sodium chloride 0.9 % 250 mL IVPB (0 mg Intravenous Stopped 08/04/16 1020)     Initial Impression / Assessment and Plan / ED Course  I have reviewed the triage vital signs and the nursing notes.  Pertinent labs & imaging results that were available during my care of the patient were reviewed by me and considered in my medical decision making (see chart for details).     83 yo M with PMHx of COPD, HTN, AFib here with acute SOB. On arrival, pt hypertensive, tachypnea on BIPAP with diffuse rales and moderate LE edema. Suspect pulmonary edema, likely 2/2 CHF, with additional considerations of PNA. Doubt ACS - EKG non-ischemic. Less likely COPD, and pt not wheezing - suspect primarily 2/2 edema. No fever or sputum production, making PNA less likely.  Labs, imaging as above. CXR read as likely CHF with pulm edema, with possible consolidation in RLL. Given leukocytosis and findings, will cover for HAP as pt is in SNF, also give IV lasix, continue BIPAP, and admit to SDU. Labs o/w show leukocytosis, baseline renal function, and elevated BNP. Admit to hospitalist.  Final Clinical Impressions(s) / ED Diagnoses   Final diagnoses:  SOB (shortness of breath)  HAP (hospital-acquired pneumonia)  Acute congestive heart failure, unspecified congestive heart failure type (HCC)  Acute pulmonary edema (HCC)      Shaune Pollack, MD 08/04/16 1523

## 2016-08-05 ENCOUNTER — Inpatient Hospital Stay (HOSPITAL_COMMUNITY): Payer: Medicare Other

## 2016-08-05 DIAGNOSIS — I509 Heart failure, unspecified: Secondary | ICD-10-CM

## 2016-08-05 LAB — BASIC METABOLIC PANEL
ANION GAP: 8 (ref 5–15)
BUN: 10 mg/dL (ref 6–20)
CHLORIDE: 99 mmol/L — AB (ref 101–111)
CO2: 33 mmol/L — ABNORMAL HIGH (ref 22–32)
Calcium: 9 mg/dL (ref 8.9–10.3)
Creatinine, Ser: 0.74 mg/dL (ref 0.61–1.24)
Glucose, Bld: 140 mg/dL — ABNORMAL HIGH (ref 65–99)
POTASSIUM: 3.2 mmol/L — AB (ref 3.5–5.1)
Sodium: 140 mmol/L (ref 135–145)

## 2016-08-05 LAB — GLUCOSE, CAPILLARY
GLUCOSE-CAPILLARY: 152 mg/dL — AB (ref 65–99)
GLUCOSE-CAPILLARY: 121 mg/dL — AB (ref 65–99)
GLUCOSE-CAPILLARY: 136 mg/dL — AB (ref 65–99)
Glucose-Capillary: 267 mg/dL — ABNORMAL HIGH (ref 65–99)

## 2016-08-05 LAB — HEPATIC FUNCTION PANEL
ALBUMIN: 3.1 g/dL — AB (ref 3.5–5.0)
ALK PHOS: 60 U/L (ref 38–126)
ALT: 13 U/L — ABNORMAL LOW (ref 17–63)
AST: 14 U/L — ABNORMAL LOW (ref 15–41)
BILIRUBIN INDIRECT: 0.9 mg/dL (ref 0.3–0.9)
Bilirubin, Direct: 0.2 mg/dL (ref 0.1–0.5)
TOTAL PROTEIN: 6.1 g/dL — AB (ref 6.5–8.1)
Total Bilirubin: 1.1 mg/dL (ref 0.3–1.2)

## 2016-08-05 LAB — BODY FLUID CELL COUNT WITH DIFFERENTIAL
EOS FL: 0 %
Lymphs, Fluid: 70 %
Monocyte-Macrophage-Serous Fluid: 28 % — ABNORMAL LOW (ref 50–90)
Neutrophil Count, Fluid: 2 % (ref 0–25)
Other Cells, Fluid: 0 %
WBC FLUID: 128 uL (ref 0–1000)

## 2016-08-05 LAB — CBC WITH DIFFERENTIAL/PLATELET
BASOS ABS: 0 10*3/uL (ref 0.0–0.1)
Basophils Relative: 0 %
EOS PCT: 2 %
Eosinophils Absolute: 0.2 10*3/uL (ref 0.0–0.7)
HCT: 30.5 % — ABNORMAL LOW (ref 39.0–52.0)
HEMOGLOBIN: 9.5 g/dL — AB (ref 13.0–17.0)
LYMPHS ABS: 2.2 10*3/uL (ref 0.7–4.0)
LYMPHS PCT: 23 %
MCH: 27.5 pg (ref 26.0–34.0)
MCHC: 31.1 g/dL (ref 30.0–36.0)
MCV: 88.4 fL (ref 78.0–100.0)
Monocytes Absolute: 0.9 10*3/uL (ref 0.1–1.0)
Monocytes Relative: 9 %
NEUTROS PCT: 66 %
Neutro Abs: 6.5 10*3/uL (ref 1.7–7.7)
PLATELETS: 240 10*3/uL (ref 150–400)
RBC: 3.45 MIL/uL — AB (ref 4.22–5.81)
RDW: 16.4 % — ABNORMAL HIGH (ref 11.5–15.5)
WBC: 9.8 10*3/uL (ref 4.0–10.5)

## 2016-08-05 LAB — LACTATE DEHYDROGENASE: LDH: 147 U/L (ref 98–192)

## 2016-08-05 LAB — LACTATE DEHYDROGENASE, PLEURAL OR PERITONEAL FLUID: LD FL: 61 U/L — AB (ref 3–23)

## 2016-08-05 LAB — ECHOCARDIOGRAM COMPLETE
HEIGHTINCHES: 70.5 in
Weight: 2673.6 oz

## 2016-08-05 LAB — PROTEIN, PLEURAL OR PERITONEAL FLUID

## 2016-08-05 LAB — GRAM STAIN

## 2016-08-05 LAB — HEMOGLOBIN A1C
HEMOGLOBIN A1C: 7 % — AB (ref 4.8–5.6)
Mean Plasma Glucose: 154 mg/dL

## 2016-08-05 LAB — AMYLASE, PLEURAL OR PERITONEAL FLUID: AMYLASE FL: 27 U/L

## 2016-08-05 LAB — HIV ANTIBODY (ROUTINE TESTING W REFLEX): HIV Screen 4th Generation wRfx: NONREACTIVE

## 2016-08-05 MED ORDER — ALBUTEROL SULFATE (2.5 MG/3ML) 0.083% IN NEBU: 2.5000 mg | INHALATION_SOLUTION | RESPIRATORY_TRACT | Status: DC | PRN

## 2016-08-05 MED ORDER — LIDOCAINE HCL 1 % IJ SOLN
INTRAMUSCULAR | Status: AC
  Filled 2016-08-05: qty 20

## 2016-08-05 MED ORDER — LISINOPRIL 20 MG PO TABS
20.0000 mg | ORAL_TABLET | Freq: Every day | ORAL | Status: DC
  Administered 2016-08-06 – 2016-08-15 (×10): 20 mg via ORAL
  Filled 2016-08-05 (×10): qty 1

## 2016-08-05 MED ORDER — POTASSIUM CHLORIDE CRYS ER 20 MEQ PO TBCR
40.0000 meq | EXTENDED_RELEASE_TABLET | Freq: Four times a day (QID) | ORAL | Status: AC
  Administered 2016-08-05 (×2): 40 meq via ORAL
  Filled 2016-08-05 (×2): qty 2

## 2016-08-05 MED ORDER — NITROGLYCERIN 2 % TD OINT
0.5000 [in_us] | TOPICAL_OINTMENT | Freq: Four times a day (QID) | TRANSDERMAL | Status: DC
Start: 2016-08-05 — End: 2016-08-15
  Administered 2016-08-05 – 2016-08-15 (×39): 0.5 [in_us] via TOPICAL
  Filled 2016-08-05: qty 30

## 2016-08-05 MED ORDER — APIXABAN 2.5 MG PO TABS
2.5000 mg | ORAL_TABLET | Freq: Two times a day (BID) | ORAL | Status: DC
  Administered 2016-08-05 – 2016-08-06 (×2): 2.5 mg via ORAL
  Filled 2016-08-05: qty 1

## 2016-08-05 NOTE — Progress Notes (Signed)
  Echocardiogram 2D Echocardiogram has been performed.  Kyle [name] 08/05/2016, 12:00 PM

## 2016-08-05 NOTE — Progress Notes (Signed)
PROGRESS NOTE                                                                                                                                                                                                             Patient Demographics:    Kyle [name], is a 83 y.o. male, DOB - [birth year], ZOX:[id number]  Admit date - 08/04/2016   Admitting Physician Haydee Salter, MD  Outpatient Primary MD for the patient is Rogelia Boga, MD  LOS - 1  Chief Complaint  Patient presents with  . CPAP/Shortness of Breath       Brief Narrative Kyle [name] is a 83 y.o. male with medical history significant for atrial fibrillation on eliquis, history of trifascicular block and subsequent pacemaker placement, dyslipidemia, sleep apnea, hypertension, diabetes, known grade 1 diastolic dysfunction, pulmonary hypertension and COPD, was admitted to the hospital on 08/04/2016 for acute on chronic hypoxic respiratory failure due to bilateral right more than left pleural effusions likely due to acute on chronic diastolic CHF.   Subjective:    Kyle [name] today has, No headache, No chest pain, No abdominal pain - No Nausea, No new weakness tingling or numbness, No Cough - +ve SOB.   Assessment  & Plan :     1. Acute on chronic hypoxic respiratory failure due to bilateral right more than left pleural effusions likely due to acute on chronic diastolic CHF last EF around 55% rule out pneumonia. Patient has been placed on IV Lasix, continue beta blocker and ACE inhibitor, apply Nitropaste, is due for ultrasound guided thoracentesis both therapeutic and diagnostic, echocardiogram ordered, placed on fluid and salt restriction, continue supportive care with oxygen and nebulizer treatments.  2. Possible pneumonia. Likely has atelectasis due to effusion, no fever, had mild leukocytosis, currently on empiric IV antibiotics which will be  continued, if cultures remain negative for 48 hours will taper down antibiotics.  3. Hypertension. Continue home medications which include beta blocker, ACE inhibitor, Cardura and now IV Lasix, have dropped his dose to provide room for diuresis.  4. Dyslipidemia. Continue Zocor.  5. OSA. BiPAP daily at bedtime.  6. Chronic A. fib [place] vasc 2 score of at least 3 - on beta blocker along with Eliquis.  7. Dyslipidemia on statin.   8. Depression. On Celexa.   9. DM type II.  On sliding scale for now.  CBG (last 3)   Recent Labs  08/04/16 1616 08/04/16 2127 08/05/16 0737  GLUCAP 147* 148* 152*    Diet : Diet NPO time specified    Family Communication  :  None  Code Status :  DNR  Disposition Plan  :  HHPT  Consults  :  None  Procedures  :    TTE  Ultrasound-guided thoracentesis  DVT Prophylaxis  :  Eliquis - SCDs    Lab Results  Component Value Date   PLT 240 08/05/2016    Inpatient Medications  Scheduled Meds: . albuterol  2.5 mg Nebulization TID  . ceFEPime (MAXIPIME) IV  1 g Intravenous Q8H  . doxazosin  4 mg Oral Daily  . escitalopram  10 mg Oral Daily  . furosemide  60 mg Intravenous Q12H  . insulin aspart  0-5 Units Subcutaneous QHS  . insulin aspart  0-9 Units Subcutaneous TID WC  . latanoprost  1 drop Both Eyes QHS  . lisinopril  40 mg Oral Daily  . mouth rinse  15 mL Mouth Rinse BID  . metoprolol succinate  25 mg Oral Daily  . potassium chloride  40 mEq Oral Q6H  . simvastatin  20 mg Oral QHS  . sodium chloride flush  3 mL Intravenous Q12H  . vancomycin  1,000 mg Intravenous Q12H   Continuous Infusions: PRN Meds:.sodium chloride, acetaminophen, albuterol, ondansetron (ZOFRAN) IV, sodium chloride flush  Antibiotics  :    Anti-infectives    Start     Dose/Rate Route Frequency Ordered Stop   08/05/16 0230  vancomycin (VANCOCIN) IVPB 1000 mg/200 mL premix     1,000 mg 200 mL/hr over 60 Minutes Intravenous Every 12 hours 08/04/16 1416      08/04/16 1430  ceFEPIme (MAXIPIME) 1 g in dextrose 5 % 50 mL IVPB     1 g 100 mL/hr over 30 Minutes Intravenous Every 8 hours 08/04/16 1407 08/12/16 1359   08/04/16 1430  vancomycin (VANCOCIN) 1,750 mg in sodium chloride 0.9 % 500 mL IVPB     1,750 mg 250 mL/hr over 120 Minutes Intravenous  Once 08/04/16 1416 08/04/16 1751   08/04/16 0830  vancomycin (VANCOCIN) 1,250 mg in sodium chloride 0.9 % 250 mL IVPB     1,250 mg 166.7 mL/hr over 90 Minutes Intravenous  Once 08/04/16 0758 08/04/16 1020   08/04/16 0800  piperacillin-tazobactam (ZOSYN) IVPB 3.375 g  Status:  Discontinued     3.375 g 12.5 mL/hr over 240 Minutes Intravenous Every 8 hours 08/04/16 0741 08/04/16 0745   08/04/16 0800  piperacillin-tazobactam (ZOSYN) IVPB 3.375 g     3.375 g 100 mL/hr over 30 Minutes Intravenous  Once 08/04/16 0758 08/04/16 0850   08/04/16 0745  vancomycin (VANCOCIN) 1,250 mg in sodium chloride 0.9 % 250 mL IVPB  Status:  Discontinued     1,250 mg 166.7 mL/hr over 90 Minutes Intravenous  Once 08/04/16 0741 08/04/16 0745         Objective:   Vitals:   08/05/16 0648 08/05/16 0738 08/05/16 0853 08/05/16 1000  BP: (!) 143/73  (!) 151/71   Pulse: 66  70 68  Resp: (!) 35     Temp: 98.5 F (36.9 C)     TempSrc: Oral     SpO2: 92% 97%  97%  Weight: 75.8 kg (167 lb 1.6 oz)     Height:        Wt Readings from Last 3 Encounters:  08/05/16  75.8 kg (167 lb 1.6 oz)  06/18/16 73.7 kg (162 lb 6.1 oz)  05/14/16 74.4 kg (164 lb)     Intake/Output Summary (Last 24 hours) at 08/05/16 1025 Last data filed at 08/05/16 0854  Gross per 24 hour  Intake             1193 ml  Output             4200 ml  Net            -3007 ml     Physical Exam  Awake Alert, Oriented X 3, No new F.N deficits, Normal affect [place].AT,PERRAL Supple Neck,No JVD, No cervical lymphadenopathy appriciated.  Symmetrical Chest wall movement, Good air movement in upper lung fields, no breath sounds in the right lower lobe area and  reduced breath sounds in the left lower lobe area RRR,No Gallops,Rubs or new Murmurs, No Parasternal Heave +ve B.Sounds, Abd Soft, No tenderness, No organomegaly appriciated, No rebound - guarding or rigidity. No Cyanosis, Clubbing or edema, No new Rash or bruise       Data Review:    CBC  Recent Labs Lab 08/04/16 0708 08/05/16 0449  WBC 12.4* 9.8  HGB 9.6* 9.5*  HCT 31.1* 30.5*  PLT 239 240  MCV 88.9 88.4  MCH 27.4 27.5  MCHC 30.9 31.1  RDW 16.5* 16.4*  LYMPHSABS 1.7 2.2  MONOABS 0.8 0.9  EOSABS 0.2 0.2  BASOSABS 0.0 0.0    Chemistries   Recent Labs Lab 08/04/16 0708 08/05/16 0449  NA 140 140  K 4.0 3.2*  CL 102 99*  CO2 28 33*  GLUCOSE 178* 140*  BUN 9 10  CREATININE 0.65 0.74  CALCIUM 9.2 9.0  AST 16  --   ALT 16*  --   ALKPHOS 60  --   BILITOT 0.9  --    ------------------------------------------------------------------------------------------------------------------ No results for input(s): CHOL, HDL, LDLCALC, TRIG, CHOLHDL, LDLDIRECT in the last 72 hours.  Lab Results  Component Value Date   HGBA1C 7.0 (H) 08/04/2016   ------------------------------------------------------------------------------------------------------------------ No results for input(s): TSH, T4TOTAL, T3FREE, THYROIDAB in the last 72 hours.  Invalid input(s): FREET3 ------------------------------------------------------------------------------------------------------------------ No results for input(s): VITAMINB12, FOLATE, FERRITIN, TIBC, IRON, RETICCTPCT in the last 72 hours.  Coagulation profile No results for input(s): INR, PROTIME in the last 168 hours.  No results for input(s): DDIMER in the last 72 hours.  Cardiac Enzymes No results for input(s): CKMB, TROPONINI, MYOGLOBIN in the last 168 hours.  Invalid input(s): CK ------------------------------------------------------------------------------------------------------------------    Component Value Date/Time    BNP 367.4 (H) 08/04/2016 1610    Micro Results Recent Results (from the past 240 hour(s))  Respiratory Panel by PCR     Status: None   Collection Time: 08/04/16  9:56 AM  Result Value Ref Range Status   Adenovirus NOT DETECTED NOT DETECTED Final   Coronavirus 229E NOT DETECTED NOT DETECTED Final   Coronavirus HKU1 NOT DETECTED NOT DETECTED Final   Coronavirus NL63 NOT DETECTED NOT DETECTED Final   Coronavirus OC43 NOT DETECTED NOT DETECTED Final   Metapneumovirus NOT DETECTED NOT DETECTED Final   Rhinovirus / Enterovirus NOT DETECTED NOT DETECTED Final   Influenza A NOT DETECTED NOT DETECTED Final   Influenza B NOT DETECTED NOT DETECTED Final   Parainfluenza Virus 1 NOT DETECTED NOT DETECTED Final   Parainfluenza Virus 2 NOT DETECTED NOT DETECTED Final   Parainfluenza Virus 3 NOT DETECTED NOT DETECTED Final   Parainfluenza Virus 4 NOT DETECTED NOT DETECTED  Final   Respiratory Syncytial Virus NOT DETECTED NOT DETECTED Final   Bordetella pertussis NOT DETECTED NOT DETECTED Final   Chlamydophila pneumoniae NOT DETECTED NOT DETECTED Final   Mycoplasma pneumoniae NOT DETECTED NOT DETECTED Final  MRSA PCR Screening     Status: None   Collection Time: 08/04/16  2:44 PM  Result Value Ref Range Status   MRSA by PCR NEGATIVE NEGATIVE Final    Comment:        The GeneXpert MRSA Assay (FDA approved for NASAL specimens only), is one component of a comprehensive MRSA colonization surveillance program. It is not intended to diagnose MRSA infection nor to guide or monitor treatment for MRSA infections.     Radiology Reports Ct Chest Wo Contrast  Result Date: 08/04/2016 CLINICAL DATA:  83 year old male with history of congestive heart failure. Large right pleural effusion. EXAM: CT CHEST WITHOUT CONTRAST TECHNIQUE: Multidetector CT imaging of the chest was performed following the standard protocol without IV contrast. COMPARISON:  No priors. FINDINGS: Cardiovascular: Heart size is mildly  enlarged. Small amount of pericardial fluid and/or thickening, unlikely to be of hemodynamic significance at this time. There is aortic atherosclerosis, as well as atherosclerosis of the great vessels of the mediastinum and the coronary arteries, including calcified atherosclerotic plaque in the left main, left anterior descending, left circumflex and right coronary arteries. Mild calcifications of the aortic valve. Mild calcifications of the mitral annulus. Left-sided pacemaker device with lead tips terminating in the right atrial appendage and right ventricular apex. Mediastinum/Nodes: No pathologically enlarged mediastinal or hilar lymph nodes. Please note that accurate exclusion of hilar adenopathy is limited on noncontrast CT scans. Esophagus is unremarkable in appearance. No axillary lymphadenopathy. Lungs/Pleura: Large right and moderate left-sided pleural effusions. This is associated with what appears to be passive atelectasis in the lungs. Specifically, the entire right lower lobe and right middle lobe are collapsed, and much of the dependent portions of the left lower lobe also appear collapse. There does appear to be some patchy airspace consolidation, most evident in the periphery of the inferior segment of the lingula and in the remaining aerated portions of the left lower lobe where there are some air bronchograms, indicative of underlying pneumonia. Retained secretions are seen filling the bronchus intermedius largely occluding the bronchi to the right middle and lower lobes. Upper Abdomen: Aortic atherosclerosis. Trace volume of perihepatic ascites. Musculoskeletal: Old vertebral body compression fracture of T8 with approximately 80% loss of anterior vertebral body height. Old vertebral body compression fracture of T10 with approximately 10% loss of anterior vertebral body height. There are no aggressive appearing lytic or blastic lesions noted in the visualized portions of the skeleton. IMPRESSION:  1. Areas of airspace consolidation in the left lower lobe and inferior segment of the lingula, concerning for multilobar pneumonia. 2. Large right and moderate left-sided pleural effusions with associated extensive atelectasis in the entire right lower lobe and right middle lobe, and in the dependent portions of the left lower lobe. 3. Aortic atherosclerosis, in addition to left main and 3 vessel coronary artery disease. 4. There are calcifications of the aortic valve and mitral annulus. Echocardiographic correlation for evaluation of potential valvular dysfunction may be warranted if clinically indicated. 5. Trace amount of pericardial fluid and/or thickening, unlikely to be of any hemodynamic significance at this time. 6. Trace volume of perihepatic ascites. 7. Additional incidental findings, as above. Electronically Signed   By: Trudie Reed M.D.   On: 08/04/2016 12:24   Dg Chest [facility]  1 View  Result Date: 08/04/2016 CLINICAL DATA:  Severe shortness of breath since this morning, fever, hypertension, diabetes mellitus, COPD, paroxysmal atrial fibrillation EXAM: PORTABLE CHEST 1 VIEW COMPARISON:  Portable exam 0726 hours compared to 06/12/2016 FINDINGS: LEFT subclavian sequential pacemaker with leads projecting over RIGHT atrium and RIGHT ventricle unchanged. Enlargement of cardiac silhouette with pulmonary vascular congestion. Atherosclerotic calcification aorta. Moderate to large RIGHT pleural effusion with basilar atelectasis. BILATERAL interstitial infiltrates slightly increased question pulmonary edema though infection in RIGHT lung not excluded. No pneumothorax. Bones demineralized. IMPRESSION: Enlargement of cardiac silhouette post pacemaker with pulmonary vascular congestion and BILATERAL pulmonary infiltrates favor pulmonary edema as above. Moderate to large RIGHT pleural effusion. Aortic atherosclerosis. Electronically Signed   By: Ulyses SouthwardMark  Boles M.D.   On: 08/04/2016 07:37    Time Spent in minutes   30   Nicolas Sisler K M.D on 08/05/2016 at 10:25 AM  Between 7am to 7pm - Pager - [phone]  After 7pm go to [email or web address] - password [email or web address]  [facility] -  Office  [phone]

## 2016-08-05 NOTE — Procedures (Signed)
PROCEDURE SUMMARY:  Successful US guided right thoracentesis. Yielded 1.7 liters of clear yellow fluid. Pt tolerated procedure well. No immediate complications.  Specimen was sent for labs. CXR ordered.  Mirra Basilio S Nayanna Seaborn PA-C 08/05/2016 2:10 PM

## 2016-08-05 NOTE — Clinical Social Work Note (Signed)
Clinical Social Work Assessment  Patient Details  Name: Kyle [name] MRN: [id number] Date of Birth: 30-Nov-1925  Date of referral:  08/05/16               Reason for consult:  Facility Placement, Discharge Planning                Permission sought to share information with:  Facility Sport and exercise psychologist, Family Supports Permission granted to share information::  Yes, Verbal Permission Granted  Name::     Physicist, medical::  [employer]  Relationship::  HPOA, daughter  Contact Information:     Housing/Transportation Living arrangements for the past 2 months:  [place] of Information:  Patient, Adult Children Patient Interpreter Needed:  None Criminal Activity/Legal Involvement Pertinent to Current Situation/Hospitalization:  No - Comment as needed Significant Relationships:  Adult Children, Spouse Lives with:  Facility Resident Do you feel safe going back to the place where you live?  Yes Need for family participation in patient care:  Yes (Comment)  Care giving concerns:  The patient and family report concerns about the noise level and response time of NT at the facility.   Social Worker assessment / plan:  CSW met with patient and daughter to complete assessment. The patient is a resident of [facility]. His wife lives in [place] at the facility. The patient shares that he plans to return to the facility but would like CSW to make facility aware of concerns regarding the noise and long response time by NTs at the facility. Claiborne Billings with facility has been made aware. Plan is for return to [facility] when ready. CSW will assist with DC once appropriate.  Employment status:  Retired Nurse, adult PT Recommendations:  [place] / Referral to community resources:  [place]  Patient/Family's Response to care:  The patient and family appear happy with the care the patient has received. The patient  and daughter Lattie Haw appreciate CSW involvement.   Patient/Family's Understanding of and Emotional Response to Diagnosis, Current Treatment, and Prognosis:  The patient and family appear to have a good understanding of the reason of admission and post DC needs.   Emotional Assessment Appearance:  Appears stated age Attitude/Demeanor/Rapport:  Other (Patient is appropriate and welcoming) Affect (typically observed):  Accepting, Calm, Appropriate, Pleasant Orientation:  Oriented to Self, Oriented to Place, Oriented to  Time, Oriented to Situation Alcohol / Substance use:  Not Applicable Psych involvement (Current and /or in the community):  No (Comment)  Discharge Needs  Concerns to be addressed:  Discharge Planning Concerns, Care Coordination Readmission within the last 30 days:  No Current discharge risk:  Chronically ill, Physical Impairment Barriers to Discharge:  Continued Medical Work up   Fredderick Phenix B, LCSW 08/05/2016, 11:20 AM

## 2016-08-05 NOTE — Progress Notes (Signed)
Patient ID: Kyle CairoRobert P [name], male   DOB: 1926-06-15, 83 y.o.   MRN: [id number] Patient informed that he has a PTX s/p thoracentesis today.  The patient feels a little SOB, but no more so than prior to his procedure.  He is sating 93% right now on [place], which is stable from before his procedure.  He was anywhere from 92-98% prior to his procedure.  He was sleeping when I walked in as well.  Given he is in no distress, we will repeat a CXR in the am tomorrow at 0600am.  If this is stable or improved, no further intervention is likely to be warranted.  However, if he worsens and/or his CXR worsens, he may require a chest tube to be placed.  Therefore, I will make him NPO p MN just in case.  Aryonna Gunnerson E 4:36 PM 08/05/2016

## 2016-08-05 NOTE — Evaluation (Signed)
Physical Therapy Evaluation Patient Details Name: Kyle [name] MRN: [id number] DOB: December 15, 1925 Today's Date: 08/05/2016   History of Present Illness   Kyle [name] is a 83 y.o. male with PmHx: A-fib, trifascicular block and subsequent pacemaker, dyslipidemia, sleep apnea, HTN, DM, grade 1 diastolic dysfunction, pulmonary HTN and COPD. Admitted with Shortness of breath and hypoxemia found to have Acute respiratory failure with hypoxia for thoracentesis today (08/05/16)  Clinical Impression  Pt pleasant with SOB with activity and exertion. Pt with decreased strength, balance and activity tolerance who will benefit from acute therapy to maximize mobility and function. Pt with need for supplemental O2 3-6L with activity with ability to maintain sats on 2L at rest. Encouraged daily mobility with nursing staff.     Follow Up Recommendations Supervision/Assistance - 24 hour;SNF    Equipment Recommendations  None recommended by PT    Recommendations for Other Services       Precautions / Restrictions Precautions Precautions: Fall Precaution Comments: monitor O2 sats Restrictions Weight Bearing Restrictions: No      Mobility  Bed Mobility Overal bed mobility: Needs Assistance Bed Mobility: Supine to Sit     Supine to sit: Min assist     General bed mobility comments: assist to elevate trunk, increased time with use of rail   Transfers Overall transfer level: Needs assistance Equipment used: Rolling walker (2 wheeled) Transfers: Sit to/from Stand Sit to Stand: Min assist         General transfer comment: assist to rise from bed with cues for hand placement  Ambulation/Gait Ambulation/Gait assistance: Min guard Ambulation Distance (Feet): 150 Feet Assistive device: Rolling walker (2 wheeled) Gait Pattern/deviations: Step-through pattern;Decreased stride length;Trunk flexed   Gait velocity interpretation: Below normal speed for age/gender General Gait Details: cues  for posture and breathing technique with 2 standing rest breaks and need for O2 rise from 3L>6L to maintain sats >90%  Stairs            Wheelchair Mobility    Modified Rankin (Stroke Patients Only)       Balance Overall balance assessment: Needs assistance Sitting-balance support: Feet supported;No upper extremity supported Sitting balance-Leahy Scale: Good     Standing balance support: Single extremity supported;During functional activity Standing balance-Leahy Scale: Poor Standing balance comment: standing at sink to brush teeth with one hand on counter                             Pertinent Vitals/Pain Pain Assessment: No/denies pain    Home Living Family/patient expects to be discharged to:: Skilled nursing facility Living Arrangements: Spouse/significant other Available Help at Discharge: Family Type of Home: Apartment Home Access: Level entry     Home Layout: One level Home Equipment: Environmental consultantWalker - 2 wheels;Shower seat;Wheelchair - manual Additional Comments: [facility] 2-3 weeksa ago now in skilled part of [facility] with desire to regain function to return to apartment    Prior Function Level of Independence: Independent with assistive device(s)         Comments: Uses a rollator for ambulation, assist for meals and housework at baseline     Hand Dominance   Dominant Hand: Right    Extremity/Trunk Assessment   Upper Extremity Assessment Upper Extremity Assessment: Defer to OT evaluation    Lower Extremity Assessment Lower Extremity Assessment: Generalized weakness    Cervical / Trunk Assessment Cervical / Trunk Assessment: Kyphotic  Communication  Communication: No difficulties  Cognition Arousal/Alertness: Awake/alert Behavior During Therapy: WFL for tasks assessed/performed Overall Cognitive Status: Within Functional Limits for tasks assessed                      General Comments       Exercises     Assessment/Plan    PT Assessment Patient needs continued PT services  PT Problem List Decreased strength;Decreased activity tolerance;Decreased balance;Decreased knowledge of use of DME;Cardiopulmonary status limiting activity;Decreased mobility          PT Treatment Interventions Gait training;Therapeutic exercise;Patient/family education;DME instruction;Therapeutic activities;Functional mobility training;Balance training    PT Goals (Current goals can be found in the Care Plan section)  Acute Rehab PT Goals Patient Stated Goal: return home PT Goal Formulation: With patient Time For Goal Achievement: 08/19/16 Potential to Achieve Goals: Good    Frequency Min 3X/week   Barriers to discharge Decreased caregiver support      Co-evaluation               End of Session Equipment Utilized During Treatment: Gait belt;Oxygen Activity Tolerance: Patient tolerated treatment well Patient left: in chair;with call bell/phone within reach;with chair alarm set Nurse Communication: Mobility status         Time: 1610-9604 PT Time Calculation (min) (ACUTE ONLY): 35 min   Charges:   PT Evaluation $PT Eval Moderate Complexity: 1 Procedure PT Treatments $Gait Training: 8-22 mins   PT G Codes:        Crockett Rallo B Roger Kettles 12-Aug-2016, 10:07 AM Delaney Meigs, PT [phone]

## 2016-08-05 NOTE — Evaluation (Signed)
Occupational Therapy Evaluation Patient Details Name: Kyle [name] MRN: [id number] DOB: Jan 16, 1926 Today's Date: 08/05/2016    History of Present Illness  Kyle [name] is a 83 y.o. male with PmHx: A-fib, trifascicular block and subsequent pacemaker, dyslipidemia, sleep apnea, HTN, DM, grade 1 diastolic dysfunction, pulmonary HTN and COPD. Admitted with Shortness of breath and hypoxemia found to have Acute respiratory failure with hypoxia for thorcentsis today (08/05/16)   Clinical Impression   This 83 yo male admitted with above presents to acute OT with deficits below (see OT problem list) thus affecting his PLOF up until 2-3 weeks ago of being totally independent with all basic ADLs, IADLs, and driving. He will benefit from acute OT with follow up OT back at SNF to work back towards his PLOF.    Follow Up Recommendations  SNF    Equipment Recommendations  None recommended by OT       Precautions / Restrictions Precautions Precautions: Fall Precaution Comments: monitor O2 sats Restrictions Weight Bearing Restrictions: No      Mobility Bed Mobility               General bed mobility comments: Pt up in recliner upon arrival  Transfers Overall transfer level: Needs assistance Equipment used: Rolling walker (2 wheeled) Transfers: Sit to/from Stand Sit to Stand: Min guard (from recliner with VCs for safe hand placement)              Balance Overall balance assessment: Needs assistance Sitting-balance support: Feet supported;No upper extremity supported Sitting balance-Leahy Scale: Good     Standing balance support: Single extremity supported;During functional activity Standing balance-Leahy Scale: Poor Standing balance comment: one hand on RW while simulating pulling up pants with one hand at a time                             ADL Overall ADL's : Needs assistance/impaired Eating/Feeding: Independent;Sitting   Grooming: Set  up;Supervision/safety;Sitting   Upper Body Bathing: Set up;Supervision/ safety;Sitting   Lower Body Bathing: Moderate assistance (with minguard A sit<>stand)   Upper Body Dressing : Supervision/safety;Set up;Sitting   Lower Body Dressing: Maximal assistance (with min guard A sit<>stand )   Toilet Transfer: Minimal assistance;Ambulation;RW   Toileting- ArchitectClothing Manipulation and Hygiene: Min guard;Sit to/from stand                         Pertinent Vitals/Pain Pain Assessment: No/denies pain     Hand Dominance Right   Extremity/Trunk Assessment Upper Extremity Assessment Upper Extremity Assessment: Generalized weakness           Communication Communication Communication: No difficulties   Cognition Arousal/Alertness: Awake/alert Behavior During Therapy: WFL for tasks assessed/performed Overall Cognitive Status: Within Functional Limits for tasks assessed                                Home Living Family/patient expects to be discharged to:: Skilled nursing facility                                 Additional Comments: [facility] 2[facility]3 weeksa ago now in skilled part of [facility]      Prior Functioning/Environment          Comments: Uses a rollator for ambulation  OT Problem List: Decreased strength;Cardiopulmonary status limiting activity;Impaired balance (sitting and/or standing)   OT Treatment/Interventions: Self-care/ADL training;Therapeutic activities;DME and/or AE instruction;Patient/family education;Balance training    OT Goals(Current goals can be found in the care plan section) Acute Rehab OT Goals Patient Stated Goal: to eat OT Goal Formulation: With patient Time For Goal Achievement: 08/19/16 Potential to Achieve Goals: Good  OT Frequency: Min 1X/week              End of Session Equipment Utilized During Treatment: Rolling walker (O2 at 3 liters) Nurse Communication:   (pt asking when he will have procedure due to he is hungry)  Activity Tolerance: Patient tolerated treatment well Patient left: in chair;with call bell/phone within reach;with chair alarm set   Time: [phone] OT Time Calculation (min): 13 min Charges:  OT General Charges $OT Visit: 1 Procedure OT Evaluation $OT Eval Moderate Complexity: 1 Procedure  Evette Georges [phone] 08/05/2016, 9:02 AM

## 2016-08-06 ENCOUNTER — Inpatient Hospital Stay (HOSPITAL_COMMUNITY): Payer: Medicare Other

## 2016-08-06 DIAGNOSIS — I5031 Acute diastolic (congestive) heart failure: Secondary | ICD-10-CM

## 2016-08-06 DIAGNOSIS — J9601 Acute respiratory failure with hypoxia: Secondary | ICD-10-CM

## 2016-08-06 DIAGNOSIS — I480 Paroxysmal atrial fibrillation: Secondary | ICD-10-CM

## 2016-08-06 LAB — APTT: aPTT: 36 seconds (ref 24–36)

## 2016-08-06 LAB — BASIC METABOLIC PANEL
ANION GAP: 7 (ref 5–15)
BUN: 14 mg/dL (ref 6–20)
CALCIUM: 8.6 mg/dL — AB (ref 8.9–10.3)
CO2: 34 mmol/L — ABNORMAL HIGH (ref 22–32)
Chloride: 96 mmol/L — ABNORMAL LOW (ref 101–111)
Creatinine, Ser: 0.88 mg/dL (ref 0.61–1.24)
GFR calc Af Amer: >60 mL/min (ref >60)
GLUCOSE: 168 mg/dL — AB (ref 65–99)
Potassium: 3.7 mmol/L (ref 3.5–5.1)
Sodium: 137 mmol/L (ref 135–145)

## 2016-08-06 LAB — CBC
HCT: 30.1 % — ABNORMAL LOW (ref 39.0–52.0)
Hemoglobin: 9.5 g/dL — ABNORMAL LOW (ref 13.0–17.0)
MCH: 27.9 pg (ref 26.0–34.0)
MCHC: 31.6 g/dL (ref 30.0–36.0)
MCV: 88.5 fL (ref 78.0–100.0)
PLATELETS: 237 10*3/uL (ref 150–400)
RBC: 3.4 MIL/uL — ABNORMAL LOW (ref 4.22–5.81)
RDW: 16.5 % — AB (ref 11.5–15.5)
WBC: 9.2 10*3/uL (ref 4.0–10.5)

## 2016-08-06 LAB — PH, BODY FLUID: pH, Body Fluid: 8.3

## 2016-08-06 LAB — HEPARIN LEVEL (UNFRACTIONATED): Heparin Unfractionated: 1.84 IU/mL — ABNORMAL HIGH (ref 0.30–0.70)

## 2016-08-06 LAB — GLUCOSE, CAPILLARY
GLUCOSE-CAPILLARY: 162 mg/dL — AB (ref 65–99)
GLUCOSE-CAPILLARY: 191 mg/dL — AB (ref 65–99)
Glucose-Capillary: 159 mg/dL — ABNORMAL HIGH (ref 65–99)
Glucose-Capillary: 215 mg/dL — ABNORMAL HIGH (ref 65–99)

## 2016-08-06 LAB — PROCALCITONIN: Procalcitonin: <0.1 ng/mL

## 2016-08-06 LAB — LEGIONELLA PNEUMOPHILA SEROGP 1 UR AG: L. pneumophila Serogp 1 Ur Ag: NEGATIVE

## 2016-08-06 MED ORDER — LEVOFLOXACIN 750 MG PO TABS
750.0000 mg | ORAL_TABLET | ORAL | Status: DC
  Administered 2016-08-06: 750 mg via ORAL
  Filled 2016-08-06: qty 1

## 2016-08-06 MED ORDER — HEPARIN (PORCINE) IN NACL 100-0.45 UNIT/ML-% IJ SOLN
1150.0000 [IU]/h | INTRAMUSCULAR | Status: DC
  Administered 2016-08-06: 1000 [IU]/h via INTRAVENOUS
  Filled 2016-08-06: qty 250

## 2016-08-06 NOTE — Progress Notes (Signed)
Referring Physician(s): Dr Burney GauzeP Singh  Supervising Physician: Jolaine ClickHoss, Arthur  [facility]  Chief Complaint:  Right pleural effusion 1.7 L yellow fluid removed 2/19   Subjective:  Post procedure PTX Asymptomatic---no Chest tube required This am CXR: IMPRESSION: 1. Known right-sided pneumothorax has increased slightly from prior exam.  Pt is still without symptoms this am Denies SOB Denies pain [place] 4 L 02 Sats: 99% (Pt has been on 3-4 L 02 even before procedure sats of 92-98%)  Dr Bonnielee HaffHoss has reviewed imaging and chart  Allergies: Patient has no known allergies.  Medications: Prior to Admission medications   Medication Sig Start Date End Date Taking? Authorizing Provider  albuterol (PROVENTIL,VENTOLIN) 90 MCG/ACT inhaler Inhale 2 puffs into the lungs 2 (two) times daily.     Yes Historical Provider, MD  apixaban (ELIQUIS) 2.5 MG TABS tablet Take 1 tablet (2.5 mg total) by mouth 2 (two) times daily. 11/23/15  Yes Newman Niponna C Carroll, NP  doxazosin (CARDURA) 4 MG tablet TAKE 1 TABLET EACH DAY. 03/07/16  Yes Gordy SaversPeter F Kwiatkowski, MD  escitalopram (LEXAPRO) 10 MG tablet Take 10 mg by mouth daily.   Yes Historical Provider, MD  Fluticasone Propionate, Inhal, (FLOVENT DISKUS) 100 MCG/BLIST AEPB Inhale 1 puff into the lungs 2 (two) times daily.   Yes Historical Provider, MD  latanoprost (XALATAN) 0.005 % ophthalmic solution 1 drop at bedtime.   Yes Historical Provider, MD  lisinopril (PRINIVIL,ZESTRIL) 40 MG tablet TAKE 1 TABLET ONCE DAILY. 08/17/15  Yes Gordy SaversPeter F Kwiatkowski, MD  metFORMIN (GLUCOPHAGE-XR) 500 MG 24 hr tablet Take 1,500 mg by mouth daily with breakfast.   Yes Historical Provider, MD  metoprolol succinate (TOPROL-XL) 50 MG 24 hr tablet Take 1/2 tablet by mouth daily (25mg ) 11/02/15  Yes Newman Niponna C Carroll, NP  doxycycline (VIBRAMYCIN) 100 MG capsule Take 1 capsule (100 mg total) by mouth 2 (two) times daily. Patient not taking: Reported on 08/04/2016 06/13/16    Maia PlanJoshua G Long, MD  furosemide (LASIX) 40 MG tablet 1 half tablet daily as needed for fluid control 05/14/16   Gordy SaversPeter F Kwiatkowski, MD  glucose blood (PRECISION XTRA TEST STRIPS) test strip 1 each by Other route daily as needed for other. Use as instructed 06/24/13   Gordy SaversPeter F Kwiatkowski, MD     Vital Signs: BP 133/61 (BP Location: Right Arm)   Pulse 68   Temp 98 F (36.7 C) (Oral)   Resp 18   Ht 5' 10.5" (1.791 m)   Wt 158 lb 6.4 oz (71.8 kg)   SpO2 99%   BMI 22.41 kg/m   Physical Exam  Constitutional: He is oriented to person, place, and time.  Pulmonary/Chest: Effort normal and breath sounds normal. No respiratory distress.  02 sat 99% 4L [place] Moving air well In NAD    Musculoskeletal: Normal range of motion.  Neurological: He is alert and oriented to person, place, and time.  Psychiatric: He has a normal mood and affect.  Nursing note and vitals reviewed.   Imaging:   Labs:  CBC:  Recent Labs  06/12/16 2316 08/04/16 0708 08/05/16 0449 08/06/16 0511  WBC 9.5 12.4* 9.8 9.2  HGB 11.5* 9.6* 9.5* 9.5*  HCT 36.0* 31.1* 30.5* 30.1*  PLT 243 239 240 237    COAGS: No results for input(s): INR, APTT in the last 8760 hours.  BMP:  Recent Labs  06/12/16 2316 08/04/16 0708 08/05/16 0449 08/06/16 0639  NA 143 140 140 137  K 3.6  4.0 3.2* 3.7  CL 104 102 99* 96*  CO2 29 28 33* 34*  GLUCOSE 123* 178* 140* 168*  BUN 20 9 10 14   CALCIUM 9.6 9.2 9.0 8.6*  CREATININE 0.71 0.65 0.74 0.88  GFRNONAA >60 >60 >60 >60  GFRAA >60 >60 >60 >60    LIVER FUNCTION TESTS:  Recent Labs  04/05/16 1102 06/12/16 2316 08/04/16 0708 08/05/16 1023  BILITOT 0.7 0.8 0.9 1.1  AST 13 16 16  14*  ALT 10 13* 16* 13*  ALKPHOS 71 63 60 60  PROT 7.3 6.7 6.2* 6.1*  ALBUMIN 4.0 3.7 3.1* 3.1*    Assessment and Plan:  PTX post 2/19 Rt thoracentesis Chest xray this am shows minimally larger PTX Pt remains asymptomatic 99% [place] 4L Will follow with CXR daily If PTX continues to  enlarge or pt becomes symptomatic--- we will place chest tube  Electronically Signed: Kyliah Deanda A 08/06/2016, 8:52 AM   I spent a total of 25 Minutes at the the patient's bedside AND on the patient's hospital floor or unit, greater than 50% of which was counseling/coordinating care for Rt pleural eff---ptx

## 2016-08-06 NOTE — NC FL2 (Signed)
[place] MEDICAID FL2 LEVEL OF CARE SCREENING TOOL     IDENTIFICATION  Patient Name: Kyle [name] Birthdate: [birth year] Sex: male Admission Date (Current Location): 08/04/2016  [facility] and [place] Number:  Producer, television/film/video and Address:  [facility], [address] [place], [place] [address]      Provider Number: [id number]  Attending Physician Name and Address:  Leroy Sea, MD  Relative Name and Phone Number:       Current Level of Care: Hospital Recommended Level of Care: Skilled Nursing Facility Prior Approval Number:    Date Approved/Denied:   PASRR Number: [id number]  Discharge Plan: SNF    Current Diagnoses: Patient Active Problem List   Diagnosis Date Noted  . Acute respiratory failure with hypoxia (HCC) 08/04/2016  . Pleural effusion, right 08/04/2016  . Acute diastolic heart failure (HCC) 08/04/2016  . Pulmonary HTN 08/04/2016  . Pneumonia 06/13/2016  . Depression 10/19/2015  . Atrial fibrillation (HCC) 11/25/2012  . Syncope 08/17/2012  . Trifascicular block-RBBB+LAFB 08/17/2012  . Edema 08/17/2012  . Obstructive sleep apnea 08/17/2012  . PROSTATE SPECIFIC ANTIGEN, ELEVATED 01/10/2009  . COPD with acute exacerbation (HCC) 10/11/2008  . BENIGN PROSTATIC HYPERTROPHY 01/13/2008  . Dyslipidemia 01/07/2007  . Diabetes mellitus due to underlying condition with diabetic polyneuropathy (HCC) 12/24/2006  . GLAUCOMA 12/24/2006  . Essential hypertension 12/24/2006    Orientation RESPIRATION BLADDER Height & Weight     Self, Time, Situation, Place  O2 (4L) Continent Weight: 71.8 kg (158 lb 6.4 oz) Height:  5' 10.5" (179.1 cm)  BEHAVIORAL SYMPTOMS/MOOD NEUROLOGICAL BOWEL NUTRITION STATUS   (NONE)  (NONE) Continent Diet (Heart Healthy Carb Modified)  AMBULATORY STATUS COMMUNICATION OF NEEDS Skin   Limited Assist Verbally Other (Comment) (Left Arm Skin Tear)                       Personal Care Assistance Level of  Assistance  Bathing, Feeding, Dressing Bathing Assistance: Limited assistance Feeding assistance: Independent Dressing Assistance: Limited assistance     Functional Limitations Info  Sight, Hearing, Speech Sight Info: Adequate Hearing Info: Adequate Speech Info: Adequate    SPECIAL CARE FACTORS FREQUENCY  PT (By licensed PT), OT (By licensed OT)     PT Frequency: 5/week OT Frequency: 5/week            Contractures Contractures Info: Not present    Additional Factors Info  Code Status, Allergies, Insulin Sliding Scale Code Status Info: DNR Allergies Info: NKDA   Insulin Sliding Scale Info: 3/day       Current Medications (08/06/2016):  This is the current hospital active medication list Current Facility-Administered Medications  Medication Dose Route Frequency Provider Last Rate Last Dose  . acetaminophen (TYLENOL) tablet 650 mg  650 mg Oral Q4H PRN Russella Dar, NP      . albuterol (PROVENTIL) (2.5 MG/3ML) 0.083% nebulizer solution 2.5 mg  2.5 mg Nebulization TID Russella Dar, NP   2.5 mg at 08/06/16 1358  . albuterol (PROVENTIL) (2.5 MG/3ML) 0.083% nebulizer solution 2.5 mg  2.5 mg Nebulization Q4H PRN Leroy Sea, MD      . doxazosin (CARDURA) tablet 4 mg  4 mg Oral Daily Russella Dar, NP   4 mg at 08/06/16 0900  . escitalopram (LEXAPRO) tablet 10 mg  10 mg Oral Daily Russella Dar, NP   10 mg at 08/06/16 0859  . furosemide (LASIX) injection 60 mg  60 mg  Intravenous Q12H Russella DarAllison L Ellis, NP   60 mg at 08/06/16 0741  . heparin ADULT infusion 100 units/mL (25000 units/22650mL sodium chloride 0.45%)  1,000 Units/hr Intravenous Continuous Scarlett Prestoheresa D Egan, RPH      . insulin aspart (novoLOG) injection 0-5 Units  0-5 Units Subcutaneous QHS Russella DarAllison L Ellis, NP      . insulin aspart (novoLOG) injection 0-9 Units  0-9 Units Subcutaneous TID WC Russella DarAllison L Ellis, NP   2 Units at 08/06/16 1237  . latanoprost (XALATAN) 0.005 % ophthalmic solution 1 drop  1 drop Both Eyes  QHS Russella DarAllison L Ellis, NP   1 drop at 08/05/16 2128  . levofloxacin (LEVAQUIN) tablet 750 mg  750 mg Oral Q24H Scarlett Prestoheresa D Egan, RPH   750 mg at 08/06/16 1445  . lisinopril (PRINIVIL,ZESTRIL) tablet 20 mg  20 mg Oral Daily Leroy SeaPrashant K Singh, MD   20 mg at 08/06/16 0859  . MEDLINE mouth rinse  15 mL Mouth Rinse BID Haydee SalterPhillip M Hobbs, MD   15 mL at 08/06/16 1000  . metoprolol succinate (TOPROL-XL) 24 hr tablet 25 mg  25 mg Oral Daily Russella DarAllison L Ellis, NP   25 mg at 08/06/16 0859  . nitroGLYCERIN (NITROGLYN) 2 % ointment 0.5 inch  0.5 inch Topical Q6H Leroy SeaPrashant K Singh, MD   0.5 inch at 08/06/16 1238  . ondansetron (ZOFRAN) injection 4 mg  4 mg Intravenous Q6H PRN Russella DarAllison L Ellis, NP      . simvastatin (ZOCOR) tablet 20 mg  20 mg Oral QHS Russella DarAllison L Ellis, NP   20 mg at 08/05/16 2128     Discharge Medications: Please see discharge summary for a list of discharge medications.  Relevant Imaging Results:  Relevant Lab Results:   Additional Information SSN: [id number]  Venita Lickampbell, Kitana Gage B, LCSW

## 2016-08-06 NOTE — Consult Note (Signed)
Cardiology Consult    Patient ID: GERY SABEDRA MRN: [id number], DOB/AGE: 1926/04/05   Admit date: 08/04/2016 Date of Consult: 08/06/2016  Primary Physician: Rogelia Boga, MD Primary Cardiologist: Dr. Graciela Husbands Requesting Provider: Dr. Thedore Mins Reason for Consultation: AF, CHF  Patient Profile    83 year old male with past medical history of persistent A. Fib, trifascicular block s/p PPM, COPD, NIDDM, hypertension, hyperlipidemia who presented with progressive dyspnea and decreased O2 sats.  Past Medical History   Past Medical History:  Diagnosis Date  . Benign prostatic hypertrophy   . CHF (congestive heart failure) (HCC)   . COPD (chronic obstructive pulmonary disease) (HCC)   . Diabetes mellitus   . Elevated PSA   . Glaucoma   . Hyperlipidemia   . Hypertension   . Intermittent complete heart block (HCC)   . Pacemaker   . PAF (paroxysmal atrial fibrillation) (HCC)    Detected on pacemaker; the duration 44 seconds  . Trifascicular block     Past Surgical History:  Procedure Laterality Date  . CATARACT EXTRACTION, BILATERAL    . PERMANENT PACEMAKER INSERTION N/A 08/24/2012   Procedure: PERMANENT PACEMAKER INSERTION;  Surgeon: Duke Salvia, MD;  Location: [facility] CATH LAB;  Service: Cardiovascular;  Laterality: N/A;  . TONSILLECTOMY       Allergies  No Known Allergies  History of Present Illness    Mr. Yoo is an 83 year old male with past medical history of persistent A. Fib, trifascicular block s/p PPM, COPD, NIDDM, hypertension, hyperlipidemia. He is normally followed by Dr. Graciela Husbands in the office, along with Rudi Coco in the A. fib clinic. He was last seen in the office by Dr. Graciela Husbands on 3/17 with interrogation of device noting increased A. fib burden. He has been encouraged to start into coagulation in the past, but declined. At this visit he agreed to start Eliquis 5 mg twice a day, along with metoprolol 50 mg HS. was seen and followed by his PCP and Eliquis  was decreased to 2.5 mg twice a day. He was last seen in the A. fib clinic 6/17 where he denied any palpitations, chest pain, dyspnea or overt bleeding. Did however complain of intermittent episodes of diarrhea. He was continued on metoprolol, but decreased to 25 mg daily.   Last echo prior to admission showed normal EF at 55-60% with no wall motion abnormalities, and grade 1 diastolic dysfunction, with PA peak pressure 52.   According to his daughter over the past 3-1/2 months he's had recurrent respiratory issues acquiring subsequent visits to his PCP, ER and urgent care. He was initially living at home independently, but due to the severity of his symptoms and progressive deconditioning he was placed in a skilled nursing facility. Over the past week has developed worsening respiratory symptoms requiring the use of oxygen. States he has also noticed swelling in his hands and feet. Denies any anginal symptoms, dizziness, palpitations.  Presented to the ED on 2/18 with decreased O2 sats in the 60s. Require the use of CPAP with EMS. Also noted to be hypertensive. Labs showed stable electrolytes, creatinine 0.65, albumin 3.1, BNP 367, lactic acid 1.73, WBC 12. Chest x-ray with moderate to large right pleural effusion with atelectasis with infection not excluded. Flu screen negative. He was placed on BiPAP, started on IV antibiotics, and given 40 mg IV Lasix 1. He was admitted with diuretic therapy escalated to 60 mg IV twice a day. Underwent right thoracentesis with 1.7 L clear yellow fluid removed. Follow-up chest x-ray  showed right-sided pneumothorax, but patient remained asymptomatic, stating his respiratory status actually improved. Echo 2/19 showed reduction in EF to 40-45% with moderate concentric hypertrophy, PA pressure 52. EKG shows A. fib with left bundle branch block similar to previous tracings.  Inpatient Medications    . albuterol  2.5 mg Nebulization TID  . doxazosin  4 mg Oral Daily  .  escitalopram  10 mg Oral Daily  . furosemide  60 mg Intravenous Q12H  . insulin aspart  0-5 Units Subcutaneous QHS  . insulin aspart  0-9 Units Subcutaneous TID WC  . latanoprost  1 drop Both Eyes QHS  . levofloxacin  750 mg Oral Q24H  . lisinopril  20 mg Oral Daily  . mouth rinse  15 mL Mouth Rinse BID  . metoprolol succinate  25 mg Oral Daily  . nitroGLYCERIN  0.5 inch Topical Q6H  . simvastatin  20 mg Oral QHS    Family History    Family History  Problem Relation Age of Onset  . Heart disease Mother     atrial fib  . Heart disease Father 36    MI  . Cancer Father     prostate    Social History    Social History   Social History  . Marital status: Married    Spouse name: N/A  . Number of children: 4  . Years of education: N/A   Occupational History  . Not on file.   Social History Main Topics  . Smoking status: Former Smoker    Packs/day: 0.50    Years: 71.00    Types: Cigarettes    Quit date: 04/08/2016  . Smokeless tobacco: Never Used  . Alcohol use 1.8 oz/week    1 Glasses of wine, 2 Shots of liquor per week     Comment: bourbon, 2 drinks a day  . Drug use: No  . Sexual activity: Not on file   Other Topics Concern  . 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  Cardiovascular:  No chest pain, ++ dyspnea on exertion,++  edema, orthopnea, palpitations, paroxysmal nocturnal dyspnea. Dermatological: No rash, lesions/masses Respiratory: No cough, ++ dyspnea Urologic: No hematuria, dysuria Abdominal:   No nausea, vomiting, diarrhea, bright red blood per rectum, melena, or hematemesis Neurologic:  No visual changes, wkns, changes in mental status. All other systems  reviewed and are otherwise negative except as noted above.  Physical Exam    Blood pressure (!) 104/54, pulse 70, temperature 98.2 F (36.8 C), temperature source Oral, resp. rate 20, height 5' 10.5" (1.791 m), weight 158 lb 6.4 oz (71.8 kg), SpO2 95 %.  General: Pleasant, Frail older white male NAD Psych: Normal affect. Neuro: Alert and oriented X 3. Moves all extremities spontaneously. HEENT: Normal  Neck: Supple without bruits or JVD. Lungs:  Resp regular and slight labored, Diminished bilaterally. Heart: Irreg Irreg no s3, s4, or murmurs. Abdomen: Soft, ascites, non-tender, non-distended, BS + x 4.  Extremities: No clubbing, cyanosis or edema. DP/PT/Radials 2+ and equal bilaterally.  Labs    Troponin [facility] of Care Test)  Recent Labs  08/04/16 0743  TROPIPOC 0.03   No results for  input(s): CKTOTAL, CKMB, TROPONINI in the last 72 hours. Lab Results  Component Value Date   WBC 9.2 08/06/2016   HGB 9.5 (L) 08/06/2016   HCT 30.1 (L) 08/06/2016   MCV 88.5 08/06/2016   PLT 237 08/06/2016    Recent Labs Lab 08/05/16 1023 08/06/16 0639  NA  --  137  K  --  3.7  CL  --  96*  CO2  --  34*  BUN  --  14  CREATININE  --  0.88  CALCIUM  --  8.6*  PROT 6.1*  --   BILITOT 1.1  --   ALKPHOS 60  --   ALT 13*  --   AST 14*  --   GLUCOSE  --  168*   Lab Results  Component Value Date   CHOL 81 09/20/2015   HDL 31.10 (L) 09/20/2015   LDLCALC 33 09/20/2015   TRIG 82.0 09/20/2015   No results found for: [facility]   Radiology Studies    Dg Chest 2 View  Result Date: 08/05/2016 CLINICAL DATA:  Acute respiratory failure with hypoxia. EXAM: CHEST  2 VIEW COMPARISON:  Thoracentesis from the same day. FINDINGS: Heart size  the the heart is enlarged. There is significant reduction in the right pleural effusion. A small pneumothorax is present on the right at approximately 20%. Left basilar airspace disease and moderate bilateral pulmonary vascular congestion is stable. A left  subclavian pacemaker is in place. Atherosclerotic changes are present at the aorta. IMPRESSION: 1. Small right-sided pneumothorax measuring 03-2019 percent following thoracentesis. 2. Marked reduction in the right pleural effusion. 3. Moderate pulmonary vascular congestion. 4. Left basilar airspace disease likely reflects atelectasis. Critical Value/emergent results were called by telephone at the time of interpretation on 08/05/2016 at 3:50 pm to Dr. Richarda OverlieAdam Henn, who verbally acknowledged these results. Electronically Signed   By: Marin Robertshristopher  Mattern M.D.   On: 08/05/2016 15:55   Ct Chest Wo Contrast  Result Date: 08/04/2016 CLINICAL DATA:  83 year old male with history of congestive heart failure. Large right pleural effusion. EXAM: CT CHEST WITHOUT CONTRAST TECHNIQUE: Multidetector CT imaging of the chest was performed following the standard protocol without IV contrast. COMPARISON:  No priors. FINDINGS: Cardiovascular: Heart size is mildly enlarged. Small amount of pericardial fluid and/or thickening, unlikely to be of hemodynamic significance at this time. There is aortic atherosclerosis, as well as atherosclerosis of the great vessels of the mediastinum and the coronary arteries, including calcified atherosclerotic plaque in the left main, left anterior descending, left circumflex and right coronary arteries. Mild calcifications of the aortic valve. Mild calcifications of the mitral annulus. Left-sided pacemaker device with lead tips terminating in the right atrial appendage and right ventricular apex. Mediastinum/Nodes: No pathologically enlarged mediastinal or hilar lymph nodes. Please note that accurate exclusion of hilar adenopathy is limited on noncontrast CT scans. Esophagus is unremarkable in appearance. No axillary lymphadenopathy. Lungs/Pleura: Large right and moderate left-sided pleural effusions. This is associated with what appears to be passive atelectasis in the lungs. Specifically, the entire  right lower lobe and right middle lobe are collapsed, and much of the dependent portions of the left lower lobe also appear collapse. There does appear to be some patchy airspace consolidation, most evident in the periphery of the inferior segment of the lingula and in the remaining aerated portions of the left lower lobe where there are some air bronchograms, indicative of underlying pneumonia. Retained secretions are seen filling the bronchus intermedius largely occluding the bronchi to the  right middle and lower lobes. Upper Abdomen: Aortic atherosclerosis. Trace volume of perihepatic ascites. Musculoskeletal: Old vertebral body compression fracture of T8 with approximately 80% loss of anterior vertebral body height. Old vertebral body compression fracture of T10 with approximately 10% loss of anterior vertebral body height. There are no aggressive appearing lytic or blastic lesions noted in the visualized portions of the skeleton. IMPRESSION: 1. Areas of airspace consolidation in the left lower lobe and inferior segment of the lingula, concerning for multilobar pneumonia. 2. Large right and moderate left-sided pleural effusions with associated extensive atelectasis in the entire right lower lobe and right middle lobe, and in the dependent portions of the left lower lobe. 3. Aortic atherosclerosis, in addition to left main and 3 vessel coronary artery disease. 4. There are calcifications of the aortic valve and mitral annulus. Echocardiographic correlation for evaluation of potential valvular dysfunction may be warranted if clinically indicated. 5. Trace amount of pericardial fluid and/or thickening, unlikely to be of any hemodynamic significance at this time. 6. Trace volume of perihepatic ascites. 7. Additional incidental findings, as above. Electronically Signed   By: Trudie Reed M.D.   On: 08/04/2016 12:24   Dg Chest Port 1 View  Result Date: 08/06/2016 CLINICAL DATA:  Shortness of breath.   Pneumothorax . EXAM: PORTABLE CHEST 1 VIEW COMPARISON:  08/05/2016 . FINDINGS: Cardiac pacer stable position. Stable cardiomegaly. Increasing interstitial prominence consistent with CHF. Small bilateral pleural effusions. Right-sided pneumothorax is increased slightly from prior exam. IMPRESSION: 1. Known right-sided pneumothorax has increased slightly from prior exam. 2. Cardiac pacer stable position. Congestive heart failure with slight increase in pulmonary interstitial edema. Bilateral small pleural effusions also noted. Electronically Signed   By: Maisie Fus  Register   On: 08/06/2016 07:07   Dg Chest Port 1 View  Result Date: 08/04/2016 CLINICAL DATA:  Severe shortness of breath since this morning, fever, hypertension, diabetes mellitus, COPD, paroxysmal atrial fibrillation EXAM: PORTABLE CHEST 1 VIEW COMPARISON:  Portable exam 0726 hours compared to 06/12/2016 FINDINGS: LEFT subclavian sequential pacemaker with leads projecting over RIGHT atrium and RIGHT ventricle unchanged. Enlargement of cardiac silhouette with pulmonary vascular congestion. Atherosclerotic calcification aorta. Moderate to large RIGHT pleural effusion with basilar atelectasis. BILATERAL interstitial infiltrates slightly increased question pulmonary edema though infection in RIGHT lung not excluded. No pneumothorax. Bones demineralized. IMPRESSION: Enlargement of cardiac silhouette post pacemaker with pulmonary vascular congestion and BILATERAL pulmonary infiltrates favor pulmonary edema as above. Moderate to large RIGHT pleural effusion. Aortic atherosclerosis. Electronically Signed   By: Ulyses Southward M.D.   On: 08/04/2016 07:37   US Thoracentesis Asp Pleural Space W/img Guide  Result Date: 08/05/2016 INDICATION: Acute Respiratory failure with hypoxia. Right pleural effusion. Request for diagnostic and therapeutic thoracentesis. EXAM: ULTRASOUND GUIDED RIGHT THORACENTESIS MEDICATIONS: 1% Lidocaine = 10 mL. COMPLICATIONS: None immediate.  PROCEDURE: An ultrasound guided thoracentesis was thoroughly discussed with the patient and questions answered. The benefits, risks, alternatives and complications were also discussed. The patient understands and wishes to proceed with the procedure. Written consent was obtained. Ultrasound was performed to localize and mark an adequate pocket of fluid in the right chest. The area was then prepped and draped in the normal sterile fashion. 1% Lidocaine was used for local anesthesia. Under ultrasound guidance a 6 Fr Safe-T-Centesis catheter was introduced. Thoracentesis was performed. The catheter was removed and a dressing applied. FINDINGS: A total of approximately 1.7 liters of clear yellow fluid was removed. Samples were sent to the laboratory as requested by the clinical team.  IMPRESSION: Successful ultrasound guided right thoracentesis yielding 1.7 liters of pleural fluid. Read by:  Corrin Parker, PA-C Electronically Signed   By: Richarda Overlie M.D.   On: 08/05/2016 14:09    ECG & Cardiac Imaging    EKG: AF  Echo: 08/05/16  Study Conclusions  - Left ventricle: Inferior wall hypokinesis. Abnormal septal motion   apical hypokinesis The cavity size was normal. There was moderate   concentric hypertrophy. Systolic function was mildly to   moderately reduced. The estimated ejection fraction was in the   range of 40% to 45%. Doppler parameters are consistent with both   elevated ventricular end-diastolic filling pressure and elevated   left atrial filling pressure. - Mitral valve: Calcified annulus. - Left atrium: The atrium was mildly dilated. - Atrial septum: No defect or patent foramen ovale was identified. - Pulmonary arteries: PA peak pressure: 52 mm Hg (S).   Assessment & Plan    83 year old male with past medical history of persistent A. Fib, trifascicular block s/p PPM, COPD, NIDDM, hypertension, hyperlipidemia who presented with progressive dyspnea and decreased O2 sats.  1. Acute  systolic HF: Noted on Echo this admission. Reports ongoing progressive dyspnea with notable swelling in hands and feet. Denies any anginal symptoms prior to admission. Question whether this is related to ongoing AF. At this time would treat conservatively with medical therapy. -- on IV lasix 60mg  IV BID with 4.6 L out yesterday, weight trending down.  -- continue Toprol XL, ACEi  2. Persistent AFib: Last noted interrogation 06/24/16 noted 87% AT/AF. AF noted on telemetry with mostly Vpacing.  -- continue Toprol XL and Eliquis went appropriate to resume.  -- This patients CHA2DS2-VASc Score and unadjusted Ischemic Stroke Rate (% per year) is equal to 4.8 % stroke rate/year from a score of 4  3. Acute on chronic Respiratory failure: Found to bilateral pleural effusions on admission. Attempts made to diuresis, but underwent thoracentesis 2/19 with 1.7L removed. Developed subsequent right sided pneumothorax currently being monitored by daily chest x-rays.   4. COPD: Former smoker for many years. Likely playing a role in his declining respiratory status.   5. HTN: Stable with current therapy.  Kaeo, Jacome, NP-C Pager [phone] 08/06/2016, 2:42 PM   Attending Note:   The patient was seen and examined.  Agree with assessment and plan as noted above.  Changes made to the above note as needed.  Patient seen and independently examined with Laverda Page, NP.   We discussed all aspects of the encounter. I agree with the assessment and plan as stated above.  1. Acute on chronic combined CHF. Has diuresed nicely.   S/p thoracentesis Overall seems to be doing better   2. Pulmonary HTN:  Likely due to COPD and his CHF Continue lasix Needs to avoid salt.    I have spent a total of 40 minutes with patient reviewing hospital  notes , telemetry, EKGs, labs and examining patient as well as establishing an assessment and plan that was discussed with the patient. > 50% of time was spent  in direct patient care.    Vesta Mixer, Montez Hageman., MD, [facility] 08/07/2016, 4:28 AM [address]  [address] Office [phone] Pager [phone]

## 2016-08-06 NOTE — Progress Notes (Signed)
ANTICOAGULATION + ANTIBIOTIC CONSULT NOTE - Follow Up Consult  Pharmacy Consult for Eliquis->IV Heparin;  Vancomycin and Cefepime -> PO Levaquin Indication: atrial fibrillation and pneumonia  No Known Allergies  Patient Measurements: Height: 5' 10.5" (179.1 cm) Weight: 158 lb 6.4 oz (71.8 kg) IBW/kg (Calculated) : 74.15 Heparin Dosing Weight: 71.8 kg  Vital Signs: Temp: 98 F (36.7 C) (02/20 0413) Temp Source: Oral (02/20 0413) BP: 137/71 (02/20 0741) Pulse Rate: 68 (02/20 0413)  Labs:  Recent Labs  08/04/16 0708 08/05/16 0449 08/06/16 0511 08/06/16 0639  HGB 9.6* 9.5* 9.5*  --   HCT 31.1* 30.5* 30.1*  --   PLT 239 240 237  --   CREATININE 0.65 0.74  --  0.88    Estimated Creatinine Clearance: 59 mL/min (by C-G formula based on SCr of 0.88 mg/dL).  Assessment:  Apixaban 2.5 mg BID for atrial fibrillation was held 2/18 pm for for thoracentesis on 2/19 am, then resumed 2/19 pm. Now to change to IV heparin in case chest tube is needed for pneumothorax s/p thoracentesis.    Last dose of Eliquis 2.5 mg was this morning at 10:27am.    Eliquis skews heparin levels, so will need to use aPTTs to monitor heparin. Baseline labs ordered, but IV heparin to begin ~10pm tonight, when next Eliquis dose would have been due.  Day # 3 Vanc and Cefepime for pneumonia coverage.  To change to oral Levaquin.   Antibiotics this admission:   Vanc 2/18 >>2/20   Cefepime 2/18 >>2/20   Levaquin PO 2/20>>  Culture data: 2/18 Sputum - no sample yet 2/18 MRSA PCR - negative 2/18 respiratory panel: negative 2/18 Flu negative 2/18 HIV negative 2/18 Strep Ag negative 2/18 Blood x 2 - ng x 2 days so far 2/19 right pleural fluid - no orgamisms on gram stain, no growth <24 hrs so far  Goal of Therapy:  Heparin level 0.3-0.7 units/ml aPTT 66-102 seconds Monitor platelets by anticoagulation protocol: Yes  Appropriate Levaquin dose for renal function and indication    Plan:   Baseline  heparin level and aPTT.  Begin IV heparin at 10 pm tonight at 1000 units/hr (~14 units/kg/hr)  Heparin level and aPTT ~6-8 hrs after heparin begins then daily.  Levaquin 750 mg PO q24hrs.  Dennie FettersEgan, Tabbitha Janvrin Donovan, RPh Pager: [phone] 08/06/2016,1:01 PM

## 2016-08-06 NOTE — Care Management Note (Addendum)
Case Management Note  Patient Details  Name: Kyle CairoRobert P [name] MRN: [id number] Date of Birth: [birth year]  Subjective/Objective: Pt presented for CHF Exacerbation. Continues on IV Lasix. [facility]                    Action/Plan: CSW is assisting with disposition needs. CM will continue to follow as well.   Expected Discharge Date:                  Expected Discharge Plan:  Skilled Nursing Facility  In-House Referral:  Clinical Social Work  Discharge planning Services  CM Consult  Post Acute Care Choice:  NA Choice offered to:  NA  DME Arranged:  N/A DME Agency:  NA  HH Arranged:  NA HH Agency:  NA  Status of Service:  Completed, signed off  If discussed at [employer] Length of Stay Meetings, dates discussed:  08-13-16, 08-15-16   Additional Comments: 08-15-16 [address] Tomi BambergerBrenda Graves-Bigelow, RN,BSN [phone] Plan will be for SNF with Hospice. CSW assisting with disposition needs. No further needs from CM a this time.   S/W ABI @ [facility] # [id number]   1. ELIQUIS  2.5 MG BID   COVER- YES  CO-PAY- $ 45.00  PRIOR APPROVAL- NO   PREFERRED PHARMACY : [facility]  MAIL ORDER FOR 90 DAY SUPPLY $ 125.00   08-13-16 [address] Angelette Ganus Graves-Bigelow, [place],BSN [phone] Chest tube removed- Chronic A Fib pt continues on IV Heparin gtt. CM will continue to monitor for additional needs.  Gala LewandowskyGraves-Bigelow, Franchesca Veneziano Kaye, RN 08/06/2016, 4:12 PM

## 2016-08-06 NOTE — Progress Notes (Addendum)
PROGRESS NOTE                                                                                                                                                                                                             Patient Demographics:    Kyle [name], is a 83 y.o. male, DOB - 12-18-1925, RUE:[id number]  Admit date - 08/04/2016   Admitting Physician Haydee Salter, MD  Outpatient Primary MD for the patient is Rogelia Boga, MD  LOS - 2  Chief Complaint  Patient presents with  . CPAP/Shortness of Breath       Brief Narrative WHITTAKER [name] is a 83 y.o. male with medical history significant for atrial fibrillation on eliquis, history of trifascicular block and subsequent pacemaker placement, dyslipidemia, sleep apnea, hypertension, diabetes, known grade 1 diastolic dysfunction, pulmonary hypertension and COPD, was admitted to the hospital on 08/04/2016 for acute on chronic hypoxic respiratory failure due to bilateral right more than left pleural effusions likely due to acute on chronic diastolic CHF.  He has improved after Lasix for diuresis and diagnostic and therapeutic paracentesis, however he has developed a small pneumothorax, I are pulmonary following, also EF seems to have dropped as compared to echocardiogram a few years ago for which cardiology has been consulted as well. Clinically he looks much better.   Subjective:    Leonides Grills today has, No headache, No chest pain, No abdominal pain - No Nausea, No new weakness tingling or numbness, No Cough - +ve SOB but much better.   Assessment  & Plan :     1. Acute on chronic hypoxic respiratory failure due to bilateral right more than left pleural effusions likely due to acute on chronic Combined systolic and diastolic CHF last EF now 45% drop from  EF of 55% few years ago. Patient has been placed on IV Lasix, continue beta blocker and ACE inhibitor,  applied Nitropaste, he status post ultrasound guided thoracentesis both therapeutic and diagnostic by IR on 08/05/2016. Clinically much improved continue diuresis. Since there is drop in EF will consult cardiology as well ? Ischemic evaluation.  2. Possible pneumonia. Likely has atelectasis due to effusion, no fever, had mild leukocytosis, appears nontoxic we'll taper down antibiotics to Levaquin orally on 08/06/2016.  3. Hypertension. Continue home medications which include beta blocker, ACE inhibitor, Cardura and  now IV Lasix, have dropped his ACE  dose to provide room for diuresis.  4. Dyslipidemia. Continue Zocor.  5. OSA. BiPAP daily at bedtime.  6. Chronic A. fib [place] vasc 2 score of at least 3 - on beta blocker along with Eliquis, due to pneumothorax and possibility of requiring a chest tube switch Eliquis to heparin on 08/06/2016.  7. Dyslipidemia on statin.   8. Depression. On Celexa.   9. Iatrogenic pneumothorax caused by ultrasound-guided thoracentesis-induced lung injury. Pneumothorax has increased in size mildly on 08/06/2016, patient is asymptomatic, continue oxygen, IR is following and I have consulted pulmonary (Dr Marchelle Gearing) as well to monitor. Since there is a chance patient might need a chest tube will stop Eliquis and transfer the patient on heparin drip.  10.DM type II. On sliding scale for now.  CBG (last 3)   Recent Labs  08/05/16 2110 08/06/16 0727 08/06/16 1129  GLUCAP 136* 159* 162*    Diet : Diet heart healthy/carb modified Room service appropriate? Yes; Fluid consistency: Thin    Family Communication  :  None  Code Status :  DNR  Disposition Plan  :  HHPT  Consults  :  Cards, PCCM (M[facility]i), IR  Procedures  :    TTE - Left ventricle: Inferior wall hypokinesis. Abnormal septal motion apical hypokinesis The cavity size was normal. There was moderate concentric hypertrophy. Systolic function was mildly to moderately reduced. The estimated ejection  fraction was in the range of 40% to 45%. Doppler parameters are consistent with both  elevated ventricular end-diastolic filling pressure and elevated left atrial filling pressure. - Mitral valve: Calcified annulus. - Left atrium: The atrium was mildly dilated. - Atrial septum: No defect or patent foramen ovale was identified. - Pulmonary arteries: PA peak pressure: 52 mm Hg (S).  Ultrasound-guided thoracentesis - 1.7 L of transudative fluid removed from right side by IR on 08/05/2016  DVT Prophylaxis  :  Eliquis - SCDs    Lab Results  Component Value Date   PLT 237 08/06/2016    Inpatient Medications  Scheduled Meds: . albuterol  2.5 mg Nebulization TID  . apixaban  2.5 mg Oral BID  . ceFEPime (MAXIPIME) IV  1 g Intravenous Q8H  . doxazosin  4 mg Oral Daily  . escitalopram  10 mg Oral Daily  . furosemide  60 mg Intravenous Q12H  . insulin aspart  0-5 Units Subcutaneous QHS  . insulin aspart  0-9 Units Subcutaneous TID WC  . latanoprost  1 drop Both Eyes QHS  . lisinopril  20 mg Oral Daily  . mouth rinse  15 mL Mouth Rinse BID  . metoprolol succinate  25 mg Oral Daily  . nitroGLYCERIN  0.5 inch Topical Q6H  . simvastatin  20 mg Oral QHS  . vancomycin  1,000 mg Intravenous Q12H   Continuous Infusions: PRN Meds:.acetaminophen, albuterol, ondansetron (ZOFRAN) IV  Antibiotics  :    Anti-infectives    Start     Dose/Rate Route Frequency Ordered Stop   08/05/16 0230  vancomycin (VANCOCIN) IVPB 1000 mg/200 mL premix     1,000 mg 200 mL/hr over 60 Minutes Intravenous Every 12 hours 08/04/16 1416     08/04/16 1430  ceFEPIme (MAXIPIME) 1 g in dextrose 5 % 50 mL IVPB     1 g 100 mL/hr over 30 Minutes Intravenous Every 8 hours 08/04/16 1407 08/12/16 1359   08/04/16 1430  vancomycin (VANCOCIN) 1,750 mg in sodium chloride 0.9 % 500 mL IVPB  1,750 mg 250 mL/hr over 120 Minutes Intravenous  Once 08/04/16 1416 08/04/16 1751   08/04/16 0830  vancomycin (VANCOCIN) 1,250 mg in sodium  chloride 0.9 % 250 mL IVPB     1,250 mg 166.7 mL/hr over 90 Minutes Intravenous  Once 08/04/16 0758 08/04/16 1020   08/04/16 0800  piperacillin-tazobactam (ZOSYN) IVPB 3.375 g  Status:  Discontinued     3.375 g 12.5 mL/hr over 240 Minutes Intravenous Every 8 hours 08/04/16 0741 08/04/16 0745   08/04/16 0800  piperacillin-tazobactam (ZOSYN) IVPB 3.375 g     3.375 g 100 mL/hr over 30 Minutes Intravenous  Once 08/04/16 0758 08/04/16 0850   08/04/16 0745  vancomycin (VANCOCIN) 1,250 mg in sodium chloride 0.9 % 250 mL IVPB  Status:  Discontinued     1,250 mg 166.7 mL/hr over 90 Minutes Intravenous  Once 08/04/16 0741 08/04/16 0745         Objective:   Vitals:   08/06/16 0413 08/06/16 0607 08/06/16 0741 08/06/16 0810  BP: 133/61  137/71   Pulse: 68     Resp: 18     Temp: 98 F (36.7 C)     TempSrc: Oral     SpO2: 90%  100% 99%  Weight:  71.8 kg (158 lb 6.4 oz)    Height:        Wt Readings from Last 3 Encounters:  08/06/16 71.8 kg (158 lb 6.4 oz)  06/18/16 73.7 kg (162 lb 6.1 oz)  05/14/16 74.4 kg (164 lb)     Intake/Output Summary (Last 24 hours) at 08/06/16 1202 Last data filed at 08/06/16 0900  Gross per 24 hour  Intake             1030 ml  Output             3300 ml  Net            -2270 ml     Physical Exam  Awake Alert, Oriented X 3, No new F.N deficits, Normal affect Morse.AT,PERRAL Supple Neck,No JVD, No cervical lymphadenopathy appriciated.  Symmetrical Chest wall movement, Good air movement in upper lung fields, reduced breath sounds in both bases RRR,No Gallops,Rubs or new Murmurs, No Parasternal Heave +ve B.Sounds, Abd Soft, No tenderness, No organomegaly appriciated, No rebound - guarding or rigidity. No Cyanosis, Clubbing or edema, No new Rash or bruise       Data Review:    CBC  Recent Labs Lab 08/04/16 0708 08/05/16 0449 08/06/16 0511  WBC 12.4* 9.8 9.2  HGB 9.6* 9.5* 9.5*  HCT 31.1* 30.5* 30.1*  PLT 239 240 237  MCV 88.9 88.4 88.5  MCH  27.4 27.5 27.9  MCHC 30.9 31.1 31.6  RDW 16.5* 16.4* 16.5*  LYMPHSABS 1.7 2.2  --   MONOABS 0.8 0.9  --   EOSABS 0.2 0.2  --   BASOSABS 0.0 0.0  --     Chemistries   Recent Labs Lab 08/04/16 0708 08/05/16 0449 08/05/16 1023 08/06/16 0639  NA 140 140  --  137  K 4.0 3.2*  --  3.7  CL 102 99*  --  96*  CO2 28 33*  --  34*  GLUCOSE 178* 140*  --  168*  BUN 9 10  --  14  CREATININE 0.65 0.74  --  0.88  CALCIUM 9.2 9.0  --  8.6*  AST 16  --  14*  --   ALT 16*  --  13*  --   ALKPHOS  60  --  60  --   BILITOT 0.9  --  1.1  --    ------------------------------------------------------------------------------------------------------------------ No results for input(s): CHOL, HDL, LDLCALC, TRIG, CHOLHDL, LDLDIRECT in the last 72 hours.  Lab Results  Component Value Date   HGBA1C 7.0 (H) 08/04/2016   ------------------------------------------------------------------------------------------------------------------ No results for input(s): TSH, T4TOTAL, T3FREE, THYROIDAB in the last 72 hours.  Invalid input(s): FREET3 ------------------------------------------------------------------------------------------------------------------ No results for input(s): VITAMINB12, FOLATE, FERRITIN, TIBC, IRON, RETICCTPCT in the last 72 hours.  Coagulation profile No results for input(s): INR, PROTIME in the last 168 hours.  No results for input(s): DDIMER in the last 72 hours.  Cardiac Enzymes No results for input(s): CKMB, TROPONINI, MYOGLOBIN in the last 168 hours.  Invalid input(s): CK ------------------------------------------------------------------------------------------------------------------    Component Value Date/Time   BNP 367.4 (H) 08/04/2016 1610    Micro Results Recent Results (from the past 240 hour(s))  Blood culture (routine x 2)     Status: None (Preliminary result)   Collection Time: 08/04/16  7:50 AM  Result Value Ref Range Status   Specimen Description BLOOD  RIGHT HAND  Final   Special Requests BOTTLES DRAWN AEROBIC AND ANAEROBIC  5CC  Final   Culture NO GROWTH 2 DAYS  Final   Report Status PENDING  Incomplete  Blood culture (routine x 2)     Status: None (Preliminary result)   Collection Time: 08/04/16  8:01 AM  Result Value Ref Range Status   Specimen Description BLOOD RIGHT ANTECUBITAL  Final   Special Requests BOTTLES DRAWN AEROBIC AND ANAEROBIC  5CC  Final   Culture NO GROWTH 2 DAYS  Final   Report Status PENDING  Incomplete  Respiratory Panel by PCR     Status: None   Collection Time: 08/04/16  9:56 AM  Result Value Ref Range Status   Adenovirus NOT DETECTED NOT DETECTED Final   Coronavirus 229E NOT DETECTED NOT DETECTED Final   Coronavirus HKU1 NOT DETECTED NOT DETECTED Final   Coronavirus NL63 NOT DETECTED NOT DETECTED Final   Coronavirus OC43 NOT DETECTED NOT DETECTED Final   Metapneumovirus NOT DETECTED NOT DETECTED Final   Rhinovirus / Enterovirus NOT DETECTED NOT DETECTED Final   Influenza A NOT DETECTED NOT DETECTED Final   Influenza B NOT DETECTED NOT DETECTED Final   Parainfluenza Virus 1 NOT DETECTED NOT DETECTED Final   Parainfluenza Virus 2 NOT DETECTED NOT DETECTED Final   Parainfluenza Virus 3 NOT DETECTED NOT DETECTED Final   Parainfluenza Virus 4 NOT DETECTED NOT DETECTED Final   Respiratory Syncytial Virus NOT DETECTED NOT DETECTED Final   Bordetella pertussis NOT DETECTED NOT DETECTED Final   Chlamydophila pneumoniae NOT DETECTED NOT DETECTED Final   Mycoplasma pneumoniae NOT DETECTED NOT DETECTED Final  MRSA PCR Screening     Status: None   Collection Time: 08/04/16  2:44 PM  Result Value Ref Range Status   MRSA by PCR NEGATIVE NEGATIVE Final    Comment:        The GeneXpert MRSA Assay (FDA approved for NASAL specimens only), is one component of a comprehensive MRSA colonization surveillance program. It is not intended to diagnose MRSA infection nor to guide or monitor treatment for MRSA infections.     Culture, body fluid-bottle     Status: None (Preliminary result)   Collection Time: 08/05/16  1:48 PM  Result Value Ref Range Status   Specimen Description FLUID PLEURAL RIGHT  Final   Special Requests NONE  Final   Culture NO GROWTH <  24 HOURS  Final   Report Status PENDING  Incomplete  Gram stain     Status: None   Collection Time: 08/05/16  1:48 PM  Result Value Ref Range Status   Specimen Description FLUID  Final   Special Requests NONE  Final   Gram Stain   Final    WBC PRESENT, PREDOMINANTLY PMN NO ORGANISMS SEEN CYTOSPIN SMEAR    Report Status 08/05/2016 FINAL  Final    Radiology Reports Dg Chest 2 View  Result Date: 08/05/2016 CLINICAL DATA:  Acute respiratory failure with hypoxia. EXAM: CHEST  2 VIEW COMPARISON:  Thoracentesis from the same day. FINDINGS: Heart size  the the heart is enlarged. There is significant reduction in the right pleural effusion. A small pneumothorax is present on the right at approximately 20%. Left basilar airspace disease and moderate bilateral pulmonary vascular congestion is stable. A left subclavian pacemaker is in place. Atherosclerotic changes are present at the aorta. IMPRESSION: 1. Small right-sided pneumothorax measuring 03-2019 percent following thoracentesis. 2. Marked reduction in the right pleural effusion. 3. Moderate pulmonary vascular congestion. 4. Left basilar airspace disease likely reflects atelectasis. Critical Value/emergent results were called by telephone at the time of interpretation on 08/05/2016 at 3:50 pm to Dr. Richarda Overlie, who verbally acknowledged these results. Electronically Signed   By: Marin Roberts M.D.   On: 08/05/2016 15:55   Ct Chest Wo Contrast  Result Date: 08/04/2016 CLINICAL DATA:  83 year old male with history of congestive heart failure. Large right pleural effusion. EXAM: CT CHEST WITHOUT CONTRAST TECHNIQUE: Multidetector CT imaging of the chest was performed following the standard protocol without IV  contrast. COMPARISON:  No priors. FINDINGS: Cardiovascular: Heart size is mildly enlarged. Small amount of pericardial fluid and/or thickening, unlikely to be of hemodynamic significance at this time. There is aortic atherosclerosis, as well as atherosclerosis of the great vessels of the mediastinum and the coronary arteries, including calcified atherosclerotic plaque in the left main, left anterior descending, left circumflex and right coronary arteries. Mild calcifications of the aortic valve. Mild calcifications of the mitral annulus. Left-sided pacemaker device with lead tips terminating in the right atrial appendage and right ventricular apex. Mediastinum/Nodes: No pathologically enlarged mediastinal or hilar lymph nodes. Please note that accurate exclusion of hilar adenopathy is limited on noncontrast CT scans. Esophagus is unremarkable in appearance. No axillary lymphadenopathy. Lungs/Pleura: Large right and moderate left-sided pleural effusions. This is associated with what appears to be passive atelectasis in the lungs. Specifically, the entire right lower lobe and right middle lobe are collapsed, and much of the dependent portions of the left lower lobe also appear collapse. There does appear to be some patchy airspace consolidation, most evident in the periphery of the inferior segment of the lingula and in the remaining aerated portions of the left lower lobe where there are some air bronchograms, indicative of underlying pneumonia. Retained secretions are seen filling the bronchus intermedius largely occluding the bronchi to the right middle and lower lobes. Upper Abdomen: Aortic atherosclerosis. Trace volume of perihepatic ascites. Musculoskeletal: Old vertebral body compression fracture of T8 with approximately 80% loss of anterior vertebral body height. Old vertebral body compression fracture of T10 with approximately 10% loss of anterior vertebral body height. There are no aggressive appearing lytic  or blastic lesions noted in the visualized portions of the skeleton. IMPRESSION: 1. Areas of airspace consolidation in the left lower lobe and inferior segment of the lingula, concerning for multilobar pneumonia. 2. Large right and moderate left-sided pleural  effusions with associated extensive atelectasis in the entire right lower lobe and right middle lobe, and in the dependent portions of the left lower lobe. 3. Aortic atherosclerosis, in addition to left main and 3 vessel coronary artery disease. 4. There are calcifications of the aortic valve and mitral annulus. Echocardiographic correlation for evaluation of potential valvular dysfunction may be warranted if clinically indicated. 5. Trace amount of pericardial fluid and/or thickening, unlikely to be of any hemodynamic significance at this time. 6. Trace volume of perihepatic ascites. 7. Additional incidental findings, as above. Electronically Signed   By: Trudie Reedaniel  Entrikin M.D.   On: 08/04/2016 12:24   Dg Chest Port 1 View  Result Date: 08/06/2016 CLINICAL DATA:  Shortness of breath.  Pneumothorax . EXAM: PORTABLE CHEST 1 VIEW COMPARISON:  08/05/2016 . FINDINGS: Cardiac pacer stable position. Stable cardiomegaly. Increasing interstitial prominence consistent with CHF. Small bilateral pleural effusions. Right-sided pneumothorax is increased slightly from prior exam. IMPRESSION: 1. Known right-sided pneumothorax has increased slightly from prior exam. 2. Cardiac pacer stable position. Congestive heart failure with slight increase in pulmonary interstitial edema. Bilateral small pleural effusions also noted. Electronically Signed   By: Maisie Fushomas  Register   On: 08/06/2016 07:07   Dg Chest Port 1 View  Result Date: 08/04/2016 CLINICAL DATA:  Severe shortness of breath since this morning, fever, hypertension, diabetes mellitus, COPD, paroxysmal atrial fibrillation EXAM: PORTABLE CHEST 1 VIEW COMPARISON:  Portable exam 0726 hours compared to 06/12/2016 FINDINGS:  LEFT subclavian sequential pacemaker with leads projecting over RIGHT atrium and RIGHT ventricle unchanged. Enlargement of cardiac silhouette with pulmonary vascular congestion. Atherosclerotic calcification aorta. Moderate to large RIGHT pleural effusion with basilar atelectasis. BILATERAL interstitial infiltrates slightly increased question pulmonary edema though infection in RIGHT lung not excluded. No pneumothorax. Bones demineralized. IMPRESSION: Enlargement of cardiac silhouette post pacemaker with pulmonary vascular congestion and BILATERAL pulmonary infiltrates favor pulmonary edema as above. Moderate to large RIGHT pleural effusion. Aortic atherosclerosis. Electronically Signed   By: Ulyses SouthwardMark  Boles M.D.   On: 08/04/2016 07:37   [place] Thoracentesis Asp Pleural Space W/img Guide  Result Date: 08/05/2016 INDICATION: Acute Respiratory failure with hypoxia. Right pleural effusion. Request for diagnostic and therapeutic thoracentesis. EXAM: ULTRASOUND GUIDED RIGHT THORACENTESIS MEDICATIONS: 1% Lidocaine = 10 mL. COMPLICATIONS: None immediate. PROCEDURE: An ultrasound guided thoracentesis was thoroughly discussed with the patient and questions answered. The benefits, risks, alternatives and complications were also discussed. The patient understands and wishes to proceed with the procedure. Written consent was obtained. Ultrasound was performed to localize and mark an adequate pocket of fluid in the right chest. The area was then prepped and draped in the normal sterile fashion. 1% Lidocaine was used for local anesthesia. Under ultrasound guidance a 6 Fr Safe-T-Centesis catheter was introduced. Thoracentesis was performed. The catheter was removed and a dressing applied. FINDINGS: A total of approximately 1.7 liters of clear yellow fluid was removed. Samples were sent to the laboratory as requested by the clinical team. IMPRESSION: Successful ultrasound guided right thoracentesis yielding 1.7 liters of pleural fluid.  Read by:  Corrin ParkerWendy Blair, PA-C Electronically Signed   By: Richarda OverlieAdam  Henn M.D.   On: 08/05/2016 14:09    Time Spent in minutes  30   Dayannara Pascal K M.D on 08/06/2016 at 12:02 PM  Between 7am to 7pm - Pager - [phone]  After 7pm go to [email or web address] - password [email or web address]  [facility] -  Office  [phone]

## 2016-08-07 ENCOUNTER — Inpatient Hospital Stay (HOSPITAL_COMMUNITY): Payer: Medicare Other

## 2016-08-07 ENCOUNTER — Encounter (HOSPITAL_COMMUNITY): Payer: Self-pay | Admitting: General Surgery

## 2016-08-07 DIAGNOSIS — I10 Essential (primary) hypertension: Secondary | ICD-10-CM

## 2016-08-07 DIAGNOSIS — E785 Hyperlipidemia, unspecified: Secondary | ICD-10-CM

## 2016-08-07 LAB — GLUCOSE, CAPILLARY
GLUCOSE-CAPILLARY: 142 mg/dL — AB (ref 65–99)
GLUCOSE-CAPILLARY: 142 mg/dL — AB (ref 65–99)
GLUCOSE-CAPILLARY: 146 mg/dL — AB (ref 65–99)
Glucose-Capillary: 197 mg/dL — ABNORMAL HIGH (ref 65–99)

## 2016-08-07 LAB — PROTIME-INR
INR: 1.33
Prothrombin Time: 16.6 seconds — ABNORMAL HIGH (ref 11.4–15.2)

## 2016-08-07 LAB — CBC
HEMATOCRIT: 31.7 % — AB (ref 39.0–52.0)
Hemoglobin: 9.9 g/dL — ABNORMAL LOW (ref 13.0–17.0)
MCH: 27.3 pg (ref 26.0–34.0)
MCHC: 31.2 g/dL (ref 30.0–36.0)
MCV: 87.3 fL (ref 78.0–100.0)
Platelets: 242 10*3/uL (ref 150–400)
RBC: 3.63 MIL/uL — ABNORMAL LOW (ref 4.22–5.81)
RDW: 15.9 % — AB (ref 11.5–15.5)
WBC: 10.3 10*3/uL (ref 4.0–10.5)

## 2016-08-07 LAB — BASIC METABOLIC PANEL
Anion gap: 8 (ref 5–15)
BUN: 20 mg/dL (ref 6–20)
CO2: 36 mmol/L — ABNORMAL HIGH (ref 22–32)
CREATININE: 1.25 mg/dL — AB (ref 0.61–1.24)
Calcium: 8.7 mg/dL — ABNORMAL LOW (ref 8.9–10.3)
Chloride: 93 mmol/L — ABNORMAL LOW (ref 101–111)
GFR calc Af Amer: 57 mL/min — ABNORMAL LOW (ref >60)
GFR, EST NON AFRICAN AMERICAN: 50 mL/min — AB (ref >60)
GLUCOSE: 128 mg/dL — AB (ref 65–99)
POTASSIUM: 3.5 mmol/L (ref 3.5–5.1)
Sodium: 137 mmol/L (ref 135–145)

## 2016-08-07 LAB — HEPARIN LEVEL (UNFRACTIONATED): HEPARIN UNFRACTIONATED: 1.14 [IU]/mL — AB (ref 0.30–0.70)

## 2016-08-07 LAB — APTT: aPTT: 59 seconds — ABNORMAL HIGH (ref 24–36)

## 2016-08-07 MED ORDER — HEPARIN (PORCINE) IN NACL 100-0.45 UNIT/ML-% IJ SOLN
1400.0000 [IU]/h | INTRAMUSCULAR | Status: DC
  Administered 2016-08-07 – 2016-08-08 (×2): 1150 [IU]/h via INTRAVENOUS
  Administered 2016-08-09 – 2016-08-14 (×7): 1250 [IU]/h via INTRAVENOUS
  Filled 2016-08-07 (×8): qty 250

## 2016-08-07 MED ORDER — LIDOCAINE HCL (PF) 1 % IJ SOLN
INTRAMUSCULAR | Status: AC
  Filled 2016-08-07: qty 30

## 2016-08-07 MED ORDER — MIDAZOLAM HCL 2 MG/2ML IJ SOLN
INTRAMUSCULAR | Status: AC
  Filled 2016-08-07: qty 2

## 2016-08-07 MED ORDER — FENTANYL CITRATE (PF) 100 MCG/2ML IJ SOLN
INTRAMUSCULAR | Status: AC | PRN
  Administered 2016-08-07: 25 ug via INTRAVENOUS

## 2016-08-07 MED ORDER — LEVOFLOXACIN 750 MG PO TABS: 750.0000 mg | ORAL_TABLET | ORAL | Status: DC

## 2016-08-07 MED ORDER — FENTANYL CITRATE (PF) 100 MCG/2ML IJ SOLN
INTRAMUSCULAR | Status: AC
  Filled 2016-08-07: qty 2

## 2016-08-07 MED ORDER — MIDAZOLAM HCL 2 MG/2ML IJ SOLN
INTRAMUSCULAR | Status: AC | PRN
  Administered 2016-08-07 (×2): 0.5 mg via INTRAVENOUS

## 2016-08-07 MED ORDER — FUROSEMIDE 40 MG PO TABS
40.0000 mg | ORAL_TABLET | Freq: Every day | ORAL | Status: DC
  Administered 2016-08-08 – 2016-08-09 (×2): 40 mg via ORAL
  Filled 2016-08-07 (×2): qty 1

## 2016-08-07 NOTE — Progress Notes (Signed)
PROGRESS NOTE        PATIENT DETAILS Name: Kyle [name] Age: 83 y.o. Sex: male Date of Birth: December 23, 1925 Admit Date: 08/04/2016 Admitting Physician Haydee Salter, MD WUJ:[id number],AOZHY Homero Fellers, MD  Brief Narrative: Patient is a 83 y.o. male with history of atrial fibrillation, trifascicular block and pacemaker placement, HTN, hyperlipidemia, diabetes, COPD, OSA, diastolic heart failure, and pulmonary HTN. He was admitted for acute respiratory failure with hypoxia due to bilateral transudative pleural effusion, worse on right. This is likely due to chronic diastolic CHF. He developed a small pneumothorax on the right s/p ultrasound-guided thoracentesis on 08/05/16. Pneumothorax appears slightly larger today and radiology has been consulted. May need a chest tube, await further recommendations from interventional radiology.   Subjective: Patient is doing well without dyspnea or chest pain.   Assessment/Plan: Acute respiratory failure with hypoxia due to bilateral pleural effusion and acute diastolic heart failure: Better appears more compensated,-8.3 L negative balance, weight decreased to 156 pounds (171 pounds on admission). Plans are to transition to oral Lasix.  Right pleural effusion: Likely secondary to heart failure-transudative by light's criteria. Continue diuresis-underwent thoracocentesis on 2/19  Right-sided pneumothorax: Iatrogenic-likely due to recent thoracocentesis.May need a chest tube-as CT or chest x-ray shows worsening, await further recommendations from radiology. Patient remains asymptomatic.   Possible pneumonia: Likely has atelectasis due to effusion, no fever, had mild leukocytosis, appears nontoxic-continue Levaquin-stop date 2/22   Type 2 Diabetes Mellitus: CBGs stable- Continue sliding scale insulin. Plan on resuming oral medications on discharge  Dyslipidemia: Continue simvastatin.   Hypertension:Controlled- Continue metoprolol  and lisinopril.   COPD: Stable without any signs of exacerbation-continue albuterol neb.   Chronic Atrial fibrillation: Eliquis switched to heparin due to possibility of chest tube. Continue metoprolol.[place] vasc 2 score of at least 4   OSA. BiPAP daily at bedtime.   DVT Prophylaxis: Heparin   Code Status: DNR  Family Communication: None present  Disposition Plan: Remain inpatient-but will plan on Home health vs SNF on discharge  Antimicrobial agents: Anti-infectives    Start     Dose/Rate Route Frequency Ordered Stop   08/06/16 1400  levofloxacin (LEVAQUIN) tablet 750 mg     750 mg Oral Every 24 hours 08/06/16 1253     08/05/16 0230  vancomycin (VANCOCIN) IVPB 1000 mg/200 mL premix  Status:  Discontinued     1,000 mg 200 mL/hr over 60 Minutes Intravenous Every 12 hours 08/04/16 1416 08/06/16 1205   08/04/16 1430  ceFEPIme (MAXIPIME) 1 g in dextrose 5 % 50 mL IVPB  Status:  Discontinued     1 g 100 mL/hr over 30 Minutes Intravenous Every 8 hours 08/04/16 1407 08/06/16 1205   08/04/16 1430  vancomycin (VANCOCIN) 1,750 mg in sodium chloride 0.9 % 500 mL IVPB     1,750 mg 250 mL/hr over 120 Minutes Intravenous  Once 08/04/16 1416 08/04/16 1751   08/04/16 0830  vancomycin (VANCOCIN) 1,250 mg in sodium chloride 0.9 % 250 mL IVPB     1,250 mg 166.7 mL/hr over 90 Minutes Intravenous  Once 08/04/16 0758 08/04/16 1020   08/04/16 0800  piperacillin-tazobactam (ZOSYN) IVPB 3.375 g  Status:  Discontinued     3.375 g 12.5 mL/hr over 240 Minutes Intravenous Every 8 hours 08/04/16 0741 08/04/16 0745   08/04/16 0800  piperacillin-tazobactam (ZOSYN) IVPB 3.375 g     3.375  g 100 mL/hr over 30 Minutes Intravenous  Once 08/04/16 0758 08/04/16 0850   08/04/16 0745  vancomycin (VANCOCIN) 1,250 mg in sodium chloride 0.9 % 250 mL IVPB  Status:  Discontinued     1,250 mg 166.7 mL/hr over 90 Minutes Intravenous  Once 08/04/16 0741 08/04/16 0745      Procedures: None  CONSULTS:   Radiology  Time spent: 25 minutes-Greater than 50% of this time was spent in counseling, explanation of diagnosis, planning of further management, and coordination of care.  MEDICATIONS: Scheduled Meds: . albuterol  2.5 mg Nebulization TID  . doxazosin  4 mg Oral Daily  . escitalopram  10 mg Oral Daily  . furosemide  60 mg Intravenous Q12H  . insulin aspart  0-5 Units Subcutaneous QHS  . insulin aspart  0-9 Units Subcutaneous TID WC  . latanoprost  1 drop Both Eyes QHS  . levofloxacin  750 mg Oral Q24H  . lisinopril  20 mg Oral Daily  . mouth rinse  15 mL Mouth Rinse BID  . metoprolol succinate  25 mg Oral Daily  . nitroGLYCERIN  0.5 inch Topical Q6H  . simvastatin  20 mg Oral QHS   Continuous Infusions: . heparin 1,150 Units/hr (08/07/16 0538)   PRN Meds:.acetaminophen, albuterol, ondansetron (ZOFRAN) IV   PHYSICAL EXAM: Vital signs: Vitals:   08/06/16 2004 08/07/16 0003 08/07/16 0500 08/07/16 0820  BP: 115/60 114/67 (!) 143/73   Pulse: 70 70 71   Resp: 18 18 18    Temp: 98.4 F (36.9 C) 97.8 F (36.6 C) 97.9 F (36.6 C)   TempSrc: Oral Oral Oral   SpO2: 98% 97% 95% 91%  Weight:   70.9 kg (156 lb 3.2 oz)   Height:       Filed Weights   08/05/16 0648 08/06/16 0607 08/07/16 0500  Weight: 75.8 kg (167 lb 1.6 oz) 71.8 kg (158 lb 6.4 oz) 70.9 kg (156 lb 3.2 oz)   Body mass index is 22.1 kg/m.   General appearance :Awake, alert, not in any distress. Speech Clear. Not toxic Looking Eyes:, pupils equally reactive to light and accomodation,no scleral icterus.Pink conjunctiva HEENT: Atraumatic and Normocephalic Neck: supple, no JVD. No cervical lymphadenopathy. No thyromegaly Resp:Good air entry on left without added sounds, decreased breath sounds on right with scattered rales CVS: S1 S2 regular, no murmurs.  GI: Bowel sounds present, Non tender and not distended with no gaurding, rigidity or rebound.No organomegaly Extremities: B/L Lower Ext shows no edema, both legs  are warm to touch Neurology:  speech clear,Non focal, sensation is grossly intact. Psychiatric: Normal judgment and insight. Alert and oriented x 3. Normal mood. Musculoskeletal:No digital cyanosis Skin:No Rash, warm and dry Wounds:N/A  I have personally reviewed following labs and imaging studies  LABORATORY DATA: CBC:  Recent Labs Lab 08/04/16 0708 08/05/16 0449 08/06/16 0511 08/07/16 0404  WBC 12.4* 9.8 9.2 10.3  NEUTROABS 9.6* 6.5  --   --   HGB 9.6* 9.5* 9.5* 9.9*  HCT 31.1* 30.5* 30.1* 31.7*  MCV 88.9 88.4 88.5 87.3  PLT 239 240 237 242    Basic Metabolic Panel:  Recent Labs Lab 08/04/16 0708 08/05/16 0449 08/06/16 0639 08/07/16 0404  NA 140 140 137 137  K 4.0 3.2* 3.7 3.5  CL 102 99* 96* 93*  CO2 28 33* 34* 36*  GLUCOSE 178* 140* 168* 128*  BUN 9 10 14 20   CREATININE 0.65 0.74 0.88 1.25*  CALCIUM 9.2 9.0 8.6* 8.7*    GFR: Estimated  Creatinine Clearance: 41 mL/min (by C-G formula based on SCr of 1.25 mg/dL (H)).  Liver Function Tests:  Recent Labs Lab 08/04/16 0708 08/05/16 1023  AST 16 14*  ALT 16* 13*  ALKPHOS 60 60  BILITOT 0.9 1.1  PROT 6.2* 6.1*  ALBUMIN 3.1* 3.1*   No results for input(s): LIPASE, AMYLASE in the last 168 hours. No results for input(s): AMMONIA in the last 168 hours.  Coagulation Profile: No results for input(s): INR, PROTIME in the last 168 hours.  Cardiac Enzymes: No results for input(s): CKTOTAL, CKMB, CKMBINDEX, TROPONINI in the last 168 hours.  BNP (last 3 results)  Recent Labs  04/05/16 1102  PROBNP 367.0*    HbA1C:  Recent Labs  08/04/16 1051  HGBA1C 7.0*    CBG:  Recent Labs Lab 08/06/16 0727 08/06/16 1129 08/06/16 1621 08/06/16 2116 08/07/16 0728  GLUCAP 159* 162* 215* 191* 142*    Lipid Profile: No results for input(s): CHOL, HDL, LDLCALC, TRIG, CHOLHDL, LDLDIRECT in the last 72 hours.  Thyroid Function Tests: No results for input(s): TSH, T4TOTAL, FREET4, T3FREE, THYROIDAB in  the last 72 hours.  Anemia Panel: No results for input(s): VITAMINB12, FOLATE, FERRITIN, TIBC, IRON, RETICCTPCT in the last 72 hours.  Urine analysis:    Component Value Date/Time   COLORURINE AMBER (A) 06/12/2016 1748   APPEARANCEUR HAZY (A) 06/12/2016 1748   LABSPEC 1.027 06/12/2016 1748   PHURINE 5.0 06/12/2016 1748   GLUCOSEU NEGATIVE 06/12/2016 1748   HGBUR NEGATIVE 06/12/2016 1748   HGBUR negative 04/05/2009 0856   BILIRUBINUR NEGATIVE 06/12/2016 1748   BILIRUBINUR n 09/20/2015 0840   KETONESUR NEGATIVE 06/12/2016 1748   PROTEINUR 100 (A) 06/12/2016 1748   UROBILINOGEN 0.2 09/20/2015 0840   UROBILINOGEN 0.2 04/05/2009 0856   NITRITE NEGATIVE 06/12/2016 1748   LEUKOCYTESUR NEGATIVE 06/12/2016 1748    Sepsis Labs: Lactic Acid, Venous    Component Value Date/Time   LATICACIDVEN 1.23 08/04/2016 1107    MICROBIOLOGY: Recent Results (from the past 240 hour(s))  Blood culture (routine x 2)     Status: None (Preliminary result)   Collection Time: 08/04/16  7:50 AM  Result Value Ref Range Status   Specimen Description BLOOD RIGHT HAND  Final   Special Requests BOTTLES DRAWN AEROBIC AND ANAEROBIC  5CC  Final   Culture NO GROWTH 2 DAYS  Final   Report Status PENDING  Incomplete  Blood culture (routine x 2)     Status: None (Preliminary result)   Collection Time: 08/04/16  8:01 AM  Result Value Ref Range Status   Specimen Description BLOOD RIGHT ANTECUBITAL  Final   Special Requests BOTTLES DRAWN AEROBIC AND ANAEROBIC  5CC  Final   Culture NO GROWTH 2 DAYS  Final   Report Status PENDING  Incomplete  Respiratory Panel by PCR     Status: None   Collection Time: 08/04/16  9:56 AM  Result Value Ref Range Status   Adenovirus NOT DETECTED NOT DETECTED Final   Coronavirus 229E NOT DETECTED NOT DETECTED Final   Coronavirus HKU1 NOT DETECTED NOT DETECTED Final   Coronavirus NL63 NOT DETECTED NOT DETECTED Final   Coronavirus OC43 NOT DETECTED NOT DETECTED Final    Metapneumovirus NOT DETECTED NOT DETECTED Final   Rhinovirus / Enterovirus NOT DETECTED NOT DETECTED Final   Influenza A NOT DETECTED NOT DETECTED Final   Influenza B NOT DETECTED NOT DETECTED Final   Parainfluenza Virus 1 NOT DETECTED NOT DETECTED Final   Parainfluenza Virus 2 NOT DETECTED  NOT DETECTED Final   Parainfluenza Virus 3 NOT DETECTED NOT DETECTED Final   Parainfluenza Virus 4 NOT DETECTED NOT DETECTED Final   Respiratory Syncytial Virus NOT DETECTED NOT DETECTED Final   Bordetella pertussis NOT DETECTED NOT DETECTED Final   Chlamydophila pneumoniae NOT DETECTED NOT DETECTED Final   Mycoplasma pneumoniae NOT DETECTED NOT DETECTED Final  MRSA PCR Screening     Status: None   Collection Time: 08/04/16  2:44 PM  Result Value Ref Range Status   MRSA by PCR NEGATIVE NEGATIVE Final    Comment:        The GeneXpert MRSA Assay (FDA approved for NASAL specimens only), is one component of a comprehensive MRSA colonization surveillance program. It is not intended to diagnose MRSA infection nor to guide or monitor treatment for MRSA infections.   Culture, body fluid-bottle     Status: None (Preliminary result)   Collection Time: 08/05/16  1:48 PM  Result Value Ref Range Status   Specimen Description FLUID PLEURAL RIGHT  Final   Special Requests NONE  Final   Culture NO GROWTH < 24 HOURS  Final   Report Status PENDING  Incomplete  Gram stain     Status: None   Collection Time: 08/05/16  1:48 PM  Result Value Ref Range Status   Specimen Description FLUID  Final   Special Requests NONE  Final   Gram Stain   Final    WBC PRESENT, PREDOMINANTLY PMN NO ORGANISMS SEEN CYTOSPIN SMEAR    Report Status 08/05/2016 FINAL  Final    RADIOLOGY STUDIES/RESULTS:    LOS: 3 days   Corwin LevinsSelby Rouch, PA-S   If 7PM-7AM, please contact night-coverage [email or web address] Password [email or web address] 08/07/2016, 9:17 AM  Attending MD note  Patient was seen, examined,treatment plan was discussed with the  PA-S.  I have personally reviewed the clinical findings, lab, imaging studies and management of this patient in detail. I agree with the documentation, as recorded by the PA-S.   Patient sitting comfortably at bedside-denies any chest pain, claims her breathing is okay.  On Exam: Gen. exam: Awake, alert, not in any distress Chest: Decreased air entry on the right base area-arouse in the right mid lung area. CVS: S1-S2 regular, no murmurs Abdomen: Soft, nontender and nondistended Neurology: Non-focal Skin: No rash or lesions   Impression: Decompensated diastolic heart failure: Improving Acute hypoxemic respiratory failure: Due to CHF and pleural effusion Right-sided pleural effusion: Likely secondary to CHF-transudative by light's criteria Iatrogenic pneumothorax  Plan Change Lasix to oral Spoke with IR-we'll likely require a chest tube placement today.  Rest as above  [facility] Memorial Hospital DistrictGHIMIRE,SHANKER [facility]

## 2016-08-07 NOTE — Progress Notes (Signed)
Patient ID: Kyle CairoRobert P Nichter, male   DOB: [birth year], 83 y.o.   MRN: [id number]    Referring Physician(s): Dr. Susa RaringPrashant Singh  Supervising Physician: Oley BalmHassell, Daniel  [facility]  Chief Complaint: Right [name]  Subjective: Patient is a little agitated today.  He states he is no more SOB than he has been the last couple of days.  Denies chest pain.  Allergies: Patient has no known allergies.  Medications: Prior to Admission medications   Medication Sig Start Date End Date Taking? Authorizing Provider  albuterol (PROVENTIL,VENTOLIN) 90 MCG/ACT inhaler Inhale 2 puffs into the lungs 2 (two) times daily.     Yes Historical Provider, MD  apixaban (ELIQUIS) 2.5 MG TABS tablet Take 1 tablet (2.5 mg total) by mouth 2 (two) times daily. 11/23/15  Yes Newman Niponna C Carroll, NP  doxazosin (CARDURA) 4 MG tablet TAKE 1 TABLET EACH DAY. 03/07/16  Yes Gordy SaversPeter F Kwiatkowski, MD  escitalopram (LEXAPRO) 10 MG tablet Take 10 mg by mouth daily.   Yes Historical Provider, MD  Fluticasone Propionate, Inhal, (FLOVENT DISKUS) 100 MCG/BLIST AEPB Inhale 1 puff into the lungs 2 (two) times daily.   Yes Historical Provider, MD  latanoprost (XALATAN) 0.005 % ophthalmic solution 1 drop at bedtime.   Yes Historical Provider, MD  lisinopril (PRINIVIL,ZESTRIL) 40 MG tablet TAKE 1 TABLET ONCE DAILY. 08/17/15  Yes Gordy SaversPeter F Kwiatkowski, MD  metFORMIN (GLUCOPHAGE-XR) 500 MG 24 hr tablet Take 1,500 mg by mouth daily with breakfast.   Yes Historical Provider, MD  metoprolol succinate (TOPROL-XL) 50 MG 24 hr tablet Take 1/2 tablet by mouth daily (25mg ) 11/02/15  Yes Newman Niponna C Carroll, NP  doxycycline (VIBRAMYCIN) 100 MG capsule Take 1 capsule (100 mg total) by mouth 2 (two) times daily. Patient not taking: Reported on 08/04/2016 06/13/16   Maia PlanJoshua G Long, MD  furosemide (LASIX) 40 MG tablet 1 half tablet daily as needed for fluid control 05/14/16   Gordy SaversPeter F Kwiatkowski, MD  glucose blood (PRECISION XTRA TEST  STRIPS) test strip 1 each by Other route daily as needed for other. Use as instructed 06/24/13   Gordy SaversPeter F Kwiatkowski, MD    Vital Signs: BP (!) 143/73 (BP Location: Right Arm)   Pulse 71   Temp 97.9 F (36.6 C) (Oral)   Resp 18   Ht 5' 10.5" (1.791 m)   Wt 156 lb 3.2 oz (70.9 kg)   SpO2 91%   BMI 22.10 kg/m   Physical Exam: Chest: BS audible in upper lung, but no BS in right lower lung field.  Rhonchus noises audible as well  Imaging: IMPRESSION: The known right-sided pneumothorax is slightly larger but without tension component. There is persistent effusion on the right as well as consolidation throughout much of the aerated right lung. There is atelectatic change in the left base, stable. Lungs elsewhere clear. Stable cardiac enlargement. There is aortic atherosclerosis.  Labs:  CBC:  Recent Labs  08/04/16 0708 08/05/16 0449 08/06/16 0511 08/07/16 0404  WBC 12.4* 9.8 9.2 10.3  HGB 9.6* 9.5* 9.5* 9.9*  HCT 31.1* 30.5* 30.1* 31.7*  PLT 239 240 237 242    COAGS:  Recent Labs  08/06/16 1520 08/07/16 0404  APTT 36 59*    BMP:  Recent Labs  08/04/16 0708 08/05/16 0449 08/06/16 0639 08/07/16 0404  NA 140 140 137 137  K 4.0 3.2* 3.7 3.5  CL 102 99* 96* 93*  CO2 28 33* 34* 36*  GLUCOSE 178* 140* 168* 128*  BUN 9 10 14 20   CALCIUM 9.2 9.0 8.6* 8.7*  CREATININE 0.65 0.74 0.88 1.25*  GFRNONAA >60 >60 >60 50*  GFRAA >60 >60 >60 57*    LIVER FUNCTION TESTS:  Recent Labs  04/05/16 1102 06/12/16 2316 08/04/16 0708 08/05/16 1023  BILITOT 0.7 0.8 0.9 1.1  AST 13 16 16  14*  ALT 10 13* 16* 13*  ALKPHOS 71 63 60 60  PROT 7.3 6.7 6.2* 6.1*  ALBUMIN 4.0 3.7 3.1* 3.1*    Assessment and Plan: 1. Pneumothorax s/p right thoracentesis -given increasing size of PTX, despite patient stating he is still asymptomatic, we feel that a chest tube is warranted as he is still having to rely on 4L of O2 to keep his sats up. -I have called the patient's daughter,  Fredirick Maudlin, who is his POA for assistance in making this decision for the patient as he was very frustrated and unsure what to do.   -PT/INR pending -he ate breakfast at 0830, can proceed after 1430. -heparin has been held.  It can be resumed after the procedure per IR MD's orders -Risks and Benefits discussed with the patient including bleeding, infection. All of the patient's and patient's daughter's questions were answered, patient and patient's daugther are agreeable to proceed. Consent signed and in chart.  Electronically Signed: Letha Cape 08/07/2016, 11:20 AM   I spent a total of 35 Minutes at the the patient's bedside AND on the patient's hospital floor or unit, greater than 50% of which was counseling/coordinating care for right pneumothorax after thoracentesis

## 2016-08-07 NOTE — Progress Notes (Signed)
Pt came from IR, with chest tube, no complaints at this time. Dressing clean and dry. Will cont to monitor pt.

## 2016-08-07 NOTE — Consult Note (Signed)
[facility] CM Primary Care Navigator  08/07/2016  Mercedes Valeriano [place] 12-09-1925 [id number]   Met with patient at the bedside to identify possible discharge needs. Patient reports having difficulty breathing that had led to this admission.  Patient endorses Dr. Bluford Kaufmann with [facility] [place] as the primary care provider. Daughter Lattie Haw) is his POA.  Patient is a resident of [facility] and states he is under the care of the facility and shared that he plans to return back to the facility.   Discharge plan will be for SNF ([facility]) at [facility] once stable.                  For additional questions please contact:  Edwena Felty A. Domenique Southers, BSN, RN-BC [facility] PRIMARY CARE Navigator Cell: [phone]

## 2016-08-07 NOTE — Progress Notes (Signed)
ANTICOAGULATION + ANTIBIOTIC CONSULT NOTE - Follow Up Consult  Pharmacy Consult for IV Heparin (Eliquis on hold);  PO Levaquin Indication: atrial fibrillation and pneumonia  No Known Allergies  Patient Measurements: Height: 5' 10.5" (179.1 cm) Weight: 156 lb 3.2 oz (70.9 kg) IBW/kg (Calculated) : 74.15 Heparin Dosing Weight: 71 kg  Vital Signs: Temp: 97.9 F (36.6 C) (02/21 0500) Temp Source: Oral (02/21 0500) BP: 143/73 (02/21 0500) Pulse Rate: 71 (02/21 0500)  Labs:  Recent Labs  08/05/16 0449 08/06/16 0511 08/06/16 0639 08/06/16 1520 08/07/16 0404  HGB 9.5* 9.5*  --   --  9.9*  HCT 30.5* 30.1*  --   --  31.7*  PLT 240 237  --   --  242  APTT  --   --   --  36 59*  HEPARINUNFRC  --   --   --  1.84* 1.14*  CREATININE 0.74  --  0.88  --  1.25*    Estimated Creatinine Clearance: 41 mL/min (by C-G formula based on SCr of 1.25 mg/dL (H)).  Assessment:  Apixaban 2.5 mg BID for atrial fibrillation was held 2/18 pm for for thoracentesis on 2/19 am, then resumed 2/19 pm. Changed to IV heparin on 2/20 in case chest tube is needed for pneumothorax s/p thoracentesis.Last dose of Eliquis 2.5 mg on 07/2016 at 10:27am.    Eliquis skews heparin levels, so will need to use aPTTs to monitor heparin.  Baseline aPTT 39 seconds, heparin level 1.84.  Initial aPTT subtherapeutic (59 seconds) on 1000 units/hr and drip increased to 1150 units/hr early this morning. Next aPTT due at 2pm, about 8 hrs after rate change, but drip held this morning for IR procedure, repeat thoracentesis vs chest tube.  Day # 4 antibiotics for pneumonia coverage.  Changed for Vanc and Cefepime to oral Levaquin on 2/20.  BUN/creatinine trended up.  Crcl now ~40-45 ml/min   Antibiotics this admission:   Vanc 2/18 >>2/20   Cefepime 2/18 >>2/20   Levaquin PO 2/20>>  Culture data: 2/18 Sputum - no sample yet 2/18 MRSA PCR - negative 2/18 respiratory panel: negative 2/18 Flu negative 2/18 HIV negative 2/18 Strep  Ag negative 2/18 Blood x 2 - ng x 3 days so far 2/19 right pleural fluid - no organisms on gram stain, no growth 2 days so far  Goal of Therapy:  Heparin level 0.3-0.7 units/ml aPTT 66-102 seconds Monitor platelets by anticoagulation protocol: Yes  Appropriate Levaquin dose for renal function and indication    Plan:   Heparin held this morning for IR procedure.  Thoracentesis or chest tube.  Will follow up for anticoagulation orders post-procedure  Levaquin 750 mg PO q24hrs adjusted to q48hrs with increased BUN/creatinine today. No dose today.  Will follow renal function and adjust back to q24hrs if labs trend down again.  Dennie FettersEgan, Mayar Whittier Donovan, [place] Pager: [phone] 08/07/2016,11:05 AM

## 2016-08-07 NOTE — Progress Notes (Signed)
CXR report came back, MD paged.  Will cont to monitor pt.

## 2016-08-07 NOTE — Progress Notes (Signed)
Progress Note  Patient Name: Kyle [name] Date of Encounter: 08/07/2016  Primary Cardiologist: Dr. Graciela Husbands  Subjective   Breathing is about the same, remains on O2.  Inpatient Medications    Scheduled Meds: . albuterol  2.5 mg Nebulization TID  . doxazosin  4 mg Oral Daily  . escitalopram  10 mg Oral Daily  . furosemide  60 mg Intravenous Q12H  . insulin aspart  0-5 Units Subcutaneous QHS  . insulin aspart  0-9 Units Subcutaneous TID WC  . latanoprost  1 drop Both Eyes QHS  . levofloxacin  750 mg Oral Q24H  . lisinopril  20 mg Oral Daily  . mouth rinse  15 mL Mouth Rinse BID  . metoprolol succinate  25 mg Oral Daily  . nitroGLYCERIN  0.5 inch Topical Q6H  . simvastatin  20 mg Oral QHS   Continuous Infusions: . heparin 1,150 Units/hr (08/07/16 0538)   PRN Meds: acetaminophen, albuterol, ondansetron (ZOFRAN) IV   Vital Signs    Vitals:   08/06/16 2004 08/07/16 0003 08/07/16 0500 08/07/16 0820  BP: 115/60 114/67 (!) 143/73   Pulse: 70 70 71   Resp: 18 18 18    Temp: 98.4 F (36.9 C) 97.8 F (36.6 C) 97.9 F (36.6 C)   TempSrc: Oral Oral Oral   SpO2: 98% 97% 95% 91%  Weight:   156 lb 3.2 oz (70.9 kg)   Height:        Intake/Output Summary (Last 24 hours) at 08/07/16 1610 Last data filed at 08/07/16 0900  Gross per 24 hour  Intake           623.67 ml  Output             2200 ml  Net         -1576.33 ml   Filed Weights   08/05/16 0648 08/06/16 0607 08/07/16 0500  Weight: 167 lb 1.6 oz (75.8 kg) 158 lb 6.4 oz (71.8 kg) 156 lb 3.2 oz (70.9 kg)    Telemetry    Afib - Personally Reviewed  ECG    N/A - Personally Reviewed  Physical Exam   General: Thin, Frail, older White male appearing in no acute distress. On [place] @4L  Head: Normocephalic, atraumatic.  Neck: Supple without bruits, JVD. Lungs:  Resp regular and unlabored, Diminished bilaterally.  Heart: Irreg Irreg, S1, S2, no S3, S4, or murmur; no rub. Abdomen: Soft, non-tender, non-distended with  normoactive bowel sounds. No hepatomegaly. No rebound/guarding. No obvious abdominal masses. Extremities: No clubbing, cyanosis, edema. Distal pedal pulses are 2+ bilaterally. Neuro: Alert and oriented X 3. Moves all extremities spontaneously. Psych: Normal affect.  Labs    Chemistry Recent Labs Lab 08/04/16 0708 08/05/16 0449 08/05/16 1023 08/06/16 0639 08/07/16 0404  NA 140 140  --  137 137  K 4.0 3.2*  --  3.7 3.5  CL 102 99*  --  96* 93*  CO2 28 33*  --  34* 36*  GLUCOSE 178* 140*  --  168* 128*  BUN 9 10  --  14 20  CREATININE 0.65 0.74  --  0.88 1.25*  CALCIUM 9.2 9.0  --  8.6* 8.7*  PROT 6.2*  --  6.1*  --   --   ALBUMIN 3.1*  --  3.1*  --   --   AST 16  --  14*  --   --   ALT 16*  --  13*  --   --   ALKPHOS 60  --  60  --   --   BILITOT 0.9  --  1.1  --   --   GFRNONAA >60 >60  --  >60 50*  GFRAA >60 >60  --  >60 57*  ANIONGAP 10 8  --  7 8     Hematology Recent Labs Lab 08/05/16 0449 08/06/16 0511 08/07/16 0404  WBC 9.8 9.2 10.3  RBC 3.45* 3.40* 3.63*  HGB 9.5* 9.5* 9.9*  HCT 30.5* 30.1* 31.7*  MCV 88.4 88.5 87.3  MCH 27.5 27.9 27.3  MCHC 31.1 31.6 31.2  RDW 16.4* 16.5* 15.9*  PLT 240 237 242    Cardiac EnzymesNo results for input(s): TROPONINI in the last 168 hours.  Recent Labs Lab 08/04/16 0743  TROPIPOC 0.03     BNP Recent Labs Lab 08/04/16 0708  BNP 367.4*     DDimer No results for input(s): DDIMER in the last 168 hours.    Radiology    Dg Chest Port 1 View  Result Date: 08/07/2016 CLINICAL DATA:  Shortness of breath.  Known right-sided pneumothorax EXAM: PORTABLE CHEST 1 VIEW COMPARISON:  August 06, 2016 FINDINGS: There is a pneumothorax on the right which appears overall slightly larger. No tension component. There is a airspace consolidation on the right with moderate right pleural effusion. Left lung is hyperexpanded. Atelectatic change in the left lower lobe is stable. There is cardiomegaly, stable. Pacemaker leads are  attached to the right atrium and right ventricle. There is aortic atherosclerosis. There is degenerative change in each shoulder. IMPRESSION: The known right-sided pneumothorax is slightly larger but without tension component. There is persistent effusion on the right as well as consolidation throughout much of the aerated right lung. There is atelectatic change in the left base, stable. Lungs elsewhere clear. Stable cardiac enlargement. There is aortic atherosclerosis. These results will be called to the ordering clinician or representative by the Radiologist Assistant, and communication documented in the PACS or zVision Dashboard. Electronically Signed   By: Bretta BangWilliam  Woodruff III M.D.   On: 08/07/2016 07:47   Dg Chest Port 1 View  Result Date: 08/06/2016 CLINICAL DATA:  Shortness of breath.  Pneumothorax . EXAM: PORTABLE CHEST 1 VIEW COMPARISON:  08/05/2016 . FINDINGS: Cardiac pacer stable position. Stable cardiomegaly. Increasing interstitial prominence consistent with CHF. Small bilateral pleural effusions. Right-sided pneumothorax is increased slightly from prior exam. IMPRESSION: 1. Known right-sided pneumothorax has increased slightly from prior exam. 2. Cardiac pacer stable position. Congestive heart failure with slight increase in pulmonary interstitial edema. Bilateral small pleural effusions also noted. Electronically Signed   By: Maisie Fushomas  Register   On: 08/06/2016 07:07   [place] Thoracentesis Asp Pleural Space W/img Guide  Result Date: 08/05/2016 INDICATION: Acute Respiratory failure with hypoxia. Right pleural effusion. Request for diagnostic and therapeutic thoracentesis. EXAM: ULTRASOUND GUIDED RIGHT THORACENTESIS MEDICATIONS: 1% Lidocaine = 10 mL. COMPLICATIONS: None immediate. PROCEDURE: An ultrasound guided thoracentesis was thoroughly discussed with the patient and questions answered. The benefits, risks, alternatives and complications were also discussed. The patient understands and wishes to  proceed with the procedure. Written consent was obtained. Ultrasound was performed to localize and mark an adequate pocket of fluid in the right chest. The area was then prepped and draped in the normal sterile fashion. 1% Lidocaine was used for local anesthesia. Under ultrasound guidance a 6 Fr Safe-T-Centesis catheter was introduced. Thoracentesis was performed. The catheter was removed and a dressing applied. FINDINGS: A total of approximately 1.7 liters of clear yellow fluid was removed. Samples were  sent to the laboratory as requested by the clinical team. IMPRESSION: Successful ultrasound guided right thoracentesis yielding 1.7 liters of pleural fluid. Read by:  Corrin Parker, PA-C Electronically Signed   By: Richarda Overlie M.D.   On: 08/05/2016 14:09    Cardiac Studies   TTE: 2/19  Study Conclusions  - Left ventricle: Inferior wall hypokinesis. Abnormal septal motion   apical hypokinesis The cavity size was normal. There was moderate   concentric hypertrophy. Systolic function was mildly to   moderately reduced. The estimated ejection fraction was in the   range of 40% to 45%. Doppler parameters are consistent with both   elevated ventricular end-diastolic filling pressure and elevated   left atrial filling pressure. - Mitral valve: Calcified annulus. - Left atrium: The atrium was mildly dilated. - Atrial septum: No defect or patent foramen ovale was identified. - Pulmonary arteries: PA peak pressure: 52 mm Hg (S).  Patient Profile     83 y.o. male past medical history of persistent A. Fib, trifascicular block s/p PPM, COPD, NIDDM, hypertension, hyperlipidemia who presented with progressive dyspnea and decreased O2 sats.  Assessment & Plan    1. Acute systolic HF: Noted on Echo this admission. Reports ongoing progressive dyspnea with notable swelling in hands and feet. Denies any anginal symptoms prior to admission. Question whether this is related to ongoing AF. At this time would treat  conservatively with medical therapy. -- on IV lasix 60mg  IV BID, net -8L this admission.  Weight down 163lbs>>156lbs. Had a bump in Cr today, Cl 93, CO2 36. Transition to oral lasix today, 40mg  daily.  -- continue Toprol XL, ACEi  2. Persistent AFib: Last noted interrogation 06/24/16 noted 87% AT/AF. AF noted on telemetry with mostly Vpacing.  -- continue Toprol XL and Eliquis went appropriate to resume.  -- This patients CHA2DS2-VASc Score and unadjusted Ischemic Stroke Rate (% per year) is equal to 4.8 % stroke rate/year from a score of 4  3. Acute on chronic Respiratory failure: Found to bilateral pleural effusions on admission. Attempts made to diuresis, but underwent thoracentesis 2/19 with 1.7L removed. Developed subsequent right sided pneumothorax currently being monitored by daily chest x-rays. IR following.  4. COPD: Former smoker for many years. Likely playing a role in his declining respiratory status.   5. HTN: Stable with current therapy  Signed, Laverda Page, NP  08/07/2016, 9:22 AM      Attending Note:   The patient was seen and examined.  Agree with assessment and plan as noted above.  Changes made to the above note as needed.  Patient seen and independently examined with Laverda Page, NP .   We discussed all aspects of the encounter. I agree with the assessment and plan as stated above.  1. Acute on chronic systolic congestive heart failure: The patient presents with significant symptoms of shortness breath. He had a very large right pleural effusion. His ejection fraction is only mildly depressed and I do not think that all the symptoms are due to his congestive heart failure. He has started to develop a contraction alkalosis and his creatinine is up slightly. I think that we have adequately diuresed him. We'll decrease his Lasix to 40 mg a day. Continue other meds.   2. Weight loss:  He has lost approximately 30 pounds over the past year. This is not due to  his mildly depressed left ventricle systolic function . Marland Kitchen I think that we need to look for other causes of his weight  loss and his other issues.  3.   Pneumothorax:   Kellie from IR has seen him He will likely need a chest tube.   I have spent a total of 30 minutes with patient reviewing hospital  notes , telemetry, EKGs, labs and examining patient as well as establishing an assessment and plan that was discussed with the patient. > 50% of time was spent in direct patient care.  No active cardiac issues. Will sign off. Call for questions    Alvia Grove., MD, [facility] 08/07/2016, 9:44 AM [address],  [address] Office [phone] Pager [phone]

## 2016-08-07 NOTE — Procedures (Signed)
CT guided Right Chest tube placement No complication No blood loss. [facility].

## 2016-08-07 NOTE — Progress Notes (Signed)
ANTICOAGULATION CONSULT NOTE - Follow Up Consult  Pharmacy Consult:  Heparin Indication: atrial fibrillation  No Known Allergies  Patient Measurements: Height: 5' 10.5" (179.1 cm) Weight: 156 lb 3.2 oz (70.9 kg) IBW/kg (Calculated) : 74.15 Heparin Dosing Weight: 71 kg  Vital Signs: Temp: 98 F (36.7 C) (02/21 1400) Temp Source: Oral (02/21 1400) BP: 130/70 (02/21 1558) Pulse Rate: 70 (02/21 1558)  Labs:  Recent Labs  08/05/16 0449 08/06/16 0511 08/06/16 0639 08/06/16 1520 08/07/16 0404 08/07/16 1211  HGB 9.5* 9.5*  --   --  9.9*  --   HCT 30.5* 30.1*  --   --  31.7*  --   PLT 240 237  --   --  242  --   APTT  --   --   --  36 59*  --   LABPROT  --   --   --   --   --  16.6*  INR  --   --   --   --   --  1.33  HEPARINUNFRC  --   --   --  1.84* 1.14*  --   CREATININE 0.74  --  0.88  --  1.25*  --     Estimated Creatinine Clearance: 41 mL/min (by C-G formula based on SCr of 1.25 mg/dL (H)).    Assessment: 89 YOM on Eliquis PTA for history of AFib, which was placed on hold for thoracentesis on 08/05/16 and then resumed that evening.  He was switched to IV heparin on 08/06/16.  Now s/p chest tube placement and Pharmacy to resume IV heparin at 1800 per discussion with Dr. Deanne CofferHassell.  No complication nor blood loss from procedure per MD note.  Currently using aPTT to guide heparin dosing since Eliquis is falsely elevating heparin levels.   Goal of Therapy:  Heparin level 0.3-0.7 units/ml aPTT 66 - 102 seconds Monitor platelets by anticoagulation protocol: Yes    Plan:  - At 1800, resume heparin gtt at 1150 units/hr - Check 8 hr aPTT, heparin level and CBC - Monitor closely for s/sx bleeding   Jaidon Ellery D. Laney Potashang, PharmD, BCPS Pager:  [phone] 08/07/2016, 5:14 PM

## 2016-08-07 NOTE — Progress Notes (Signed)
PT Cancellation Note  Patient Details Name: Kyle CairoRobert P Proudfoot MRN: [id number] DOB: [birth year]   Cancelled Treatment:    Reason Eval/Treat Not Completed: Other (comment) (Refused due to having a procedure today and not wanting both).  Agreed to having PT check again tomorrow for progression of PT treatment.   Ivar DrapeRuth E Marsela Kuan 08/07/2016, 11:44 AM   Samul Dadauth Sharnika Binney, PT MS Acute Rehab Dept. Number: [facility] [id number] and [facility] [phone]

## 2016-08-07 NOTE — Progress Notes (Signed)
ANTICOAGULATION CONSULT NOTE - Follow Up Consult  Pharmacy Consult for Eliquis->IV Heparin Indication: atrial fibrillation and pneumonia  No Known Allergies  Patient Measurements: Height: 5' 10.5" (179.1 cm) Weight: 158 lb 6.4 oz (71.8 kg) IBW/kg (Calculated) : 74.15 Heparin Dosing Weight: 71.8 kg  Vital Signs: Temp: 97.8 F (36.6 C) (02/21 0003) Temp Source: Oral (02/21 0003) BP: 114/67 (02/21 0003) Pulse Rate: 70 (02/21 0003)  Labs:  Recent Labs  08/05/16 0449 08/06/16 0511 08/06/16 0639 08/06/16 1520 08/07/16 0404  HGB 9.5* 9.5*  --   --  9.9*  HCT 30.5* 30.1*  --   --  31.7*  PLT 240 237  --   --  242  APTT  --   --   --  36 59*  HEPARINUNFRC  --   --   --  1.84*  --   CREATININE 0.74  --  0.88  --  1.25*    Estimated Creatinine Clearance: 41.5 mL/min (by C-G formula based on SCr of 1.25 mg/dL (H)).  Assessment:  Apixaban 2.5 mg BID for atrial fibrillation was held 2/18 pm for for thoracentesis on 2/19 am, then resumed 2/19 pm. Now to change to IV heparin in case chest tube is needed for pneumothorax s/p thoracentesis. Last dose of Eliquis 2.5 mg 2/20 1030.    Eliquis affects heparin levels, so will need to use aPTTs to monitor heparin. PTT 59 sec (subtherapeutic) on gtt at 1000 units/hr. No issues with line or bleeding reported per RN. CBC stable.  Goal of Therapy:  Heparin level 0.3-0.7 units/ml aPTT 66-102 seconds Monitor platelets by anticoagulation protocol: Yes    Plan:  Increase heparin to 1150 units/hr Will f/u PTT in 8 hours  Christoper Fabianaron Ettore Trebilcock, PharmD, BCPS Clinical pharmacist, pager [phone] 08/07/2016,5:29 AM

## 2016-08-08 ENCOUNTER — Inpatient Hospital Stay (HOSPITAL_COMMUNITY): Payer: Medicare Other

## 2016-08-08 LAB — APTT
APTT: 64 s — AB (ref 24–36)
aPTT: 79 seconds — ABNORMAL HIGH (ref 24–36)

## 2016-08-08 LAB — GLUCOSE, CAPILLARY
GLUCOSE-CAPILLARY: 176 mg/dL — AB (ref 65–99)
GLUCOSE-CAPILLARY: 155 mg/dL — AB (ref 65–99)
Glucose-Capillary: 180 mg/dL — ABNORMAL HIGH (ref 65–99)
Glucose-Capillary: 185 mg/dL — ABNORMAL HIGH (ref 65–99)
Glucose-Capillary: 142 mg/dL — ABNORMAL HIGH (ref 65–99)

## 2016-08-08 LAB — CBC
HCT: 33.2 % — ABNORMAL LOW (ref 39.0–52.0)
HEMOGLOBIN: 10.5 g/dL — AB (ref 13.0–17.0)
MCH: 27.7 pg (ref 26.0–34.0)
MCHC: 31.6 g/dL (ref 30.0–36.0)
MCV: 87.6 fL (ref 78.0–100.0)
Platelets: 225 10*3/uL (ref 150–400)
RBC: 3.79 MIL/uL — ABNORMAL LOW (ref 4.22–5.81)
RDW: 15.5 % (ref 11.5–15.5)
WBC: 11.3 10*3/uL — ABNORMAL HIGH (ref 4.0–10.5)

## 2016-08-08 LAB — BASIC METABOLIC PANEL
Anion gap: 12 (ref 5–15)
BUN: 22 mg/dL — AB (ref 6–20)
CALCIUM: 9 mg/dL (ref 8.9–10.3)
CO2: 34 mmol/L — AB (ref 22–32)
CREATININE: 1.21 mg/dL (ref 0.61–1.24)
Chloride: 91 mmol/L — ABNORMAL LOW (ref 101–111)
GFR calc Af Amer: 60 mL/min — ABNORMAL LOW (ref >60)
GFR, EST NON AFRICAN AMERICAN: 52 mL/min — AB (ref >60)
GLUCOSE: 214 mg/dL — AB (ref 65–99)
Potassium: 3.9 mmol/L (ref 3.5–5.1)
Sodium: 137 mmol/L (ref 135–145)

## 2016-08-08 LAB — HEPARIN LEVEL (UNFRACTIONATED)
HEPARIN UNFRACTIONATED: 0.85 [IU]/mL — AB (ref 0.30–0.70)
Heparin Unfractionated: 0.91 IU/mL — ABNORMAL HIGH (ref 0.30–0.70)

## 2016-08-08 NOTE — Progress Notes (Signed)
Referring Physician(s): Dr Windell Norfolk  Supervising Physician: Gilmer Mor  Patient Status:  [facility] - In-pt  Chief Complaint:  CT guided Right Chest tube placement 2/21 in IR  Subjective:  Rt PTX after Rt thoracentesis 2/19 Chest tube in place  Denies pain Breathing well  Allergies: Patient has no known allergies.  Medications: Prior to Admission medications   Medication Sig Start Date End Date Taking? Authorizing Provider  albuterol (PROVENTIL,VENTOLIN) 90 MCG/ACT inhaler Inhale 2 puffs into the lungs 2 (two) times daily.     Yes Historical Provider, MD  apixaban (ELIQUIS) 2.5 MG TABS tablet Take 1 tablet (2.5 mg total) by mouth 2 (two) times daily. 11/23/15  Yes Newman Nip, NP  doxazosin (CARDURA) 4 MG tablet TAKE 1 TABLET EACH DAY. 03/07/16  Yes Gordy Savers, MD  escitalopram (LEXAPRO) 10 MG tablet Take 10 mg by mouth daily.   Yes Historical Provider, MD  Fluticasone Propionate, Inhal, (FLOVENT DISKUS) 100 MCG/BLIST AEPB Inhale 1 puff into the lungs 2 (two) times daily.   Yes Historical Provider, MD  latanoprost (XALATAN) 0.005 % ophthalmic solution 1 drop at bedtime.   Yes Historical Provider, MD  lisinopril (PRINIVIL,ZESTRIL) 40 MG tablet TAKE 1 TABLET ONCE DAILY. 08/17/15  Yes Gordy Savers, MD  metFORMIN (GLUCOPHAGE-XR) 500 MG 24 hr tablet Take 1,500 mg by mouth daily with breakfast.   Yes Historical Provider, MD  metoprolol succinate (TOPROL-XL) 50 MG 24 hr tablet Take 1/2 tablet by mouth daily (25mg ) 11/02/15  Yes Newman Nip, NP  doxycycline (VIBRAMYCIN) 100 MG capsule Take 1 capsule (100 mg total) by mouth 2 (two) times daily. Patient not taking: Reported on 08/04/2016 06/13/16   [name], MD  furosemide (LASIX) 40 MG tablet 1 half tablet daily as needed for fluid control 05/14/16   Gordy Savers, MD  glucose blood (PRECISION XTRA TEST STRIPS) test strip 1 each by Other route daily as needed for other. Use as instructed 06/24/13   Gordy Savers, MD     Vital Signs: BP 136/76 (BP Location: Right Arm)   Pulse 70   Temp 98 F (36.7 C) (Oral)   Resp (!) 23   Ht 5' 10.5" (1.791 m)   Wt 155 lb 13.8 oz (70.7 kg)   SpO2 98%   BMI 22.05 kg/m   Physical Exam  Constitutional: He is oriented to person, place, and time.  Pulmonary/Chest: Effort normal and breath sounds normal.  Neurological: He is alert and oriented to person, place, and time.  Skin: Skin is warm and dry.  Site of drain  Is clean and dry Sl tender No bleeding 1480 cc serous fluid in Pleurvac No air leak   Psychiatric: He has a normal mood and affect.  Nursing note and vitals reviewed.   Imaging:   Labs:  CBC:  Recent Labs  08/05/16 0449 08/06/16 0511 08/07/16 0404 08/08/16 0231  WBC 9.8 9.2 10.3 11.3*  HGB 9.5* 9.5* 9.9* 10.5*  HCT 30.5* 30.1* 31.7* 33.2*  PLT 240 237 242 225    COAGS:  Recent Labs  08/06/16 1520 08/07/16 0404 08/07/16 1211 08/08/16 0231  INR  --   --  1.33  --   APTT 36 59*  --  64*    BMP:  Recent Labs  08/05/16 0449 08/06/16 0639 08/07/16 0404 08/08/16 0231  NA 140 137 137 137  K 3.2* 3.7 3.5 3.9  CL 99* 96* 93* 91*  CO2 33* 34* 36*  34*  GLUCOSE 140* 168* 128* 214*  BUN 10 14 20  22*  CALCIUM 9.0 8.6* 8.7* 9.0  CREATININE 0.74 0.88 1.25* 1.21  GFRNONAA >60 >60 50* 52*  GFRAA >60 >60 57* 60*    LIVER FUNCTION TESTS:  Recent Labs  04/05/16 1102 06/12/16 2316 08/04/16 0708 08/05/16 1023  BILITOT 0.7 0.8 0.9 1.1  AST 13 16 16  14*  ALT 10 13* 16* 13*  ALKPHOS 71 63 60 60  PROT 7.3 6.7 6.2* 6.1*  ALBUMIN 4.0 3.7 3.1* 3.1*    Assessment and Plan:  Rt chest tube intact Post thora PTX cxr pending Will discuss plan with TRH after cxr result  Electronically Signed: Brielyn Bosak A 08/08/2016, 8:08 AM   I spent a total of 15 Minutes at the the patient's bedside AND on the patient's hospital floor or unit, greater than 50% of which was counseling/coordinating care for rt chest  tube placement

## 2016-08-08 NOTE — Progress Notes (Signed)
ANTICOAGULATION CONSULT NOTE - Follow Up Consult  Pharmacy Consult:  Heparin Indication: atrial fibrillation  No Known Allergies  Patient Measurements: Height: 5' 10.5" (179.1 cm) Weight: 155 lb 13.8 oz (70.7 kg) IBW/kg (Calculated) : 74.15 Heparin Dosing Weight: 71 kg  Vital Signs: Temp: 98 F (36.7 C) (02/22 0743) Temp Source: Oral (02/22 0743) BP: 136/76 (02/22 0743) Pulse Rate: 70 (02/22 0743)  Labs:  Recent Labs  08/06/16 0511 08/06/16 [id number]  08/07/16 0404 08/07/16 1211 08/08/16 0231 08/08/16 0925  HGB 9.5*  --   --  9.9*  --  10.5*  --   HCT 30.1*  --   --  31.7*  --  33.2*  --   PLT 237  --   --  242  --  225  --   APTT  --   --   < > 59*  --  64* 79*  LABPROT  --   --   --   --  16.6*  --   --   INR  --   --   --   --  1.33  --   --   HEPARINUNFRC  --   --   < > 1.14*  --  0.91* 0.85*  CREATININE  --  0.88  --  1.25*  --  1.21  --   < > = values in this interval not displayed.  Estimated Creatinine Clearance: 42.2 mL/min (by C-G formula based on SCr of 1.21 mg/dL).    Assessment: 89 YOM on Eliquis PTA for history of AFib, which was placed on hold for thoracentesis on 08/05/16 and then resumed that evening.  He was switched to IV heparin on 08/06/16.  Now s/p chest tube placement and restarted heparin last night. Currently using aPTT to guide heparin dosing since Eliquis is falsely elevating heparin levels.  aPTT = 79, therapeutic, anti-Xa level 0.85, elevated as expected. CBC stable, No bleeding noted per chart.   Goal of Therapy:  Heparin level 0.3-0.7 units/ml aPTT 66 - 102 seconds Monitor platelets by anticoagulation protocol: Yes    Plan:  - Continue IV heparin 1250 units/hr - Monitor closely for s/sx bleeding - daily heparin level/aPTT, CBC  Bayard HuggerMei Imara Standiford, PharmD, BCPS  Clinical Pharmacist  Pager: [phone]   08/08/2016, 10:58 AM

## 2016-08-08 NOTE — Care Management Important Message (Signed)
Important Message  Patient Details  Name: Kyle [name] MRN: [id number] Date of Birth: 23-Aug-1925   Medicare Important Message Given:  Yes    Tamel Abel Abena 08/08/2016, 1:28 PM

## 2016-08-08 NOTE — Progress Notes (Signed)
ANTICOAGULATION CONSULT NOTE - Follow Up Consult  Pharmacy Consult:  Heparin Indication: atrial fibrillation  No Known Allergies  Patient Measurements: Height: 5' 10.5" (179.1 cm) Weight: 156 lb 3.2 oz (70.9 kg) IBW/kg (Calculated) : 74.15 Heparin Dosing Weight: 71 kg  Vital Signs: Temp: 97.3 F (36.3 C) (02/21 2355) Temp Source: Oral (02/21 2355) BP: 131/64 (02/22 0000) Pulse Rate: 68 (02/22 0000)  Labs:  Recent Labs  08/05/16 0449 08/06/16 0511 08/06/16 0639 08/06/16 1520 08/07/16 0404 08/07/16 1211 08/08/16 0231  HGB 9.5* 9.5*  --   --  9.9*  --  10.5*  HCT 30.5* 30.1*  --   --  31.7*  --  33.2*  PLT 240 237  --   --  242  --  225  APTT  --   --   --  36 59*  --  64*  LABPROT  --   --   --   --   --  16.6*  --   INR  --   --   --   --   --  1.33  --   HEPARINUNFRC  --   --   --  1.84* 1.14*  --  0.91*  CREATININE 0.74  --  0.88  --  1.25*  --   --     Estimated Creatinine Clearance: 41 mL/min (by C-G formula based on SCr of 1.25 mg/dL (H)).    Assessment: 89 YOM on Eliquis PTA for history of AFib, which was placed on hold for thoracentesis on 08/05/16 and then resumed that evening.  He was switched to IV heparin on 08/06/16.  Now s/p chest tube placement and Pharmacy to resume IV heparin at 1800 per discussion with Dr. Deanne CofferHassell.  No complication nor blood loss from procedure per MD note.  Currently using aPTT to guide heparin dosing since Eliquis is falsely elevating heparin levels.  - 2/22 AM aPTT 64 slightly below goal, HL 0.91, CBC Hgb improved to 10.5   Goal of Therapy:  Heparin level 0.3-0.7 units/ml aPTT 66 - 102 seconds Monitor platelets by anticoagulation protocol: Yes    Plan:  - Increase IV heparin slightly to 1250 units/hr, RN aware - Will recheck aPTT/HL in 6 hrs. - Monitor closely for s/sx bleeding   Mekhia Brogan S. Merilynn Finlandobertson, PharmD, [facility] Clinical Staff Pharmacist Pager [phone]  08/08/2016, 3:06 AM

## 2016-08-08 NOTE — Progress Notes (Signed)
PROGRESS NOTE        PATIENT DETAILS Name: Kyle [name] Age: 83 y.o. Sex: male Date of Birth: 1926-04-30 Admit Date: 08/04/2016 Admitting Physician Haydee Salter, MD VWU:[id number],FAOZH Homero Fellers, MD  Brief Narrative: Patient is a 83 y.o. male with history of atrial fibrillation, trifascicular block and pacemaker placement, HTN, hyperlipidemia, diabetes, COPD, OSA, diastolic heart failure, and pulmonary HTN. He was admitted for acute respiratory failure with hypoxia due to bilateral transudative pleural effusion, worse on right. This is likely due to chronic diastolic CHF. He developed a small pneumothorax on the right s/p ultrasound-guided thoracentesis on 08/05/16. Chest tube placed on 08/07/16, air leak appears to be resolved. Will await further recommendations from radiology for chest tube removal.   Subjective: Patient is doing well without dyspnea or chest pain.   Assessment/Plan: Acute respiratory failure with hypoxia due to bilateral pleural effusion and acute diastolic heart failure: Better, appears more compensated, weight decreased to 155 pounds (171 pounds on admission). Continue Lasix.  Right pleural effusion: Likely secondary to heart failure-transudative by light's criteria. Continue diuresis-underwent thoracocentesis on 2/19.  Right-sided pneumothorax: Iatrogenic-likely due to recent thoracocentesis. Chest tube placed on 2/21, no further air leak today. Patient remains asymptomatic. Will await further recommendations from radiology for chest tube removal.  Possible pneumonia: Likely has atelectasis due to effusion, no fever, had mild leukocytosis, appears nontoxic-continue Levaquin-stop date 2/22.  Type 2 Diabetes Mellitus: CBGs minimally elevated- Continue sliding scale insulin. Plan on resuming oral medications on discharge  Dyslipidemia: Continue simvastatin.   Hypertension: Controlled- Continue metoprolol and lisinopril.   COPD: Stable  without any signs of exacerbation-continue albuterol neb.   Chronic Atrial fibrillation: Eliquis switched to heparin-continue for now till chest tube removed. Continue metoprolol.[place] vasc 2 score of at least 4   OSA. BiPAP daily at bedtime.   DVT Prophylaxis: Heparin   Code Status: DNR  Family Communication: None present  Disposition Plan: Remain inpatient-but will plan on Home health vs SNF on discharge  Antimicrobial agents: Anti-infectives    Start     Dose/Rate Route Frequency Ordered Stop   08/08/16 1400  levofloxacin (LEVAQUIN) tablet 750 mg     750 mg Oral Every 48 hours 08/07/16 1102     08/06/16 1400  levofloxacin (LEVAQUIN) tablet 750 mg  Status:  Discontinued     750 mg Oral Every 24 hours 08/06/16 1253 08/07/16 1102   08/05/16 0230  vancomycin (VANCOCIN) IVPB 1000 mg/200 mL premix  Status:  Discontinued     1,000 mg 200 mL/hr over 60 Minutes Intravenous Every 12 hours 08/04/16 1416 08/06/16 1205   08/04/16 1430  ceFEPIme (MAXIPIME) 1 g in dextrose 5 % 50 mL IVPB  Status:  Discontinued     1 g 100 mL/hr over 30 Minutes Intravenous Every 8 hours 08/04/16 1407 08/06/16 1205   08/04/16 1430  vancomycin (VANCOCIN) 1,750 mg in sodium chloride 0.9 % 500 mL IVPB     1,750 mg 250 mL/hr over 120 Minutes Intravenous  Once 08/04/16 1416 08/04/16 1751   08/04/16 0830  vancomycin (VANCOCIN) 1,250 mg in sodium chloride 0.9 % 250 mL IVPB     1,250 mg 166.7 mL/hr over 90 Minutes Intravenous  Once 08/04/16 0758 08/04/16 1020   08/04/16 0800  piperacillin-tazobactam (ZOSYN) IVPB 3.375 g  Status:  Discontinued     3.375 g 12.5 mL/hr over  240 Minutes Intravenous Every 8 hours 08/04/16 0741 08/04/16 0745   08/04/16 0800  piperacillin-tazobactam (ZOSYN) IVPB 3.375 g     3.375 g 100 mL/hr over 30 Minutes Intravenous  Once 08/04/16 0758 08/04/16 0850   08/04/16 0745  vancomycin (VANCOCIN) 1,250 mg in sodium chloride 0.9 % 250 mL IVPB  Status:  Discontinued     1,250 mg 166.7 mL/hr  over 90 Minutes Intravenous  Once 08/04/16 0741 08/04/16 0745      Procedures: 2/21>>Chest tube placement  CONSULTS:  Radiology  Time spent: 25 minutes-Greater than 50% of this time was spent in counseling, explanation of diagnosis, planning of further management, and coordination of care.  MEDICATIONS: Scheduled Meds: . albuterol  2.5 mg Nebulization TID  . doxazosin  4 mg Oral Daily  . escitalopram  10 mg Oral Daily  . furosemide  40 mg Oral Daily  . insulin aspart  0-5 Units Subcutaneous QHS  . insulin aspart  0-9 Units Subcutaneous TID WC  . latanoprost  1 drop Both Eyes QHS  . levofloxacin  750 mg Oral Q48H  . lisinopril  20 mg Oral Daily  . mouth rinse  15 mL Mouth Rinse BID  . metoprolol succinate  25 mg Oral Daily  . nitroGLYCERIN  0.5 inch Topical Q6H  . simvastatin  20 mg Oral QHS   Continuous Infusions: . heparin 1,250 Units/hr (08/08/16 0339)   PRN Meds:.acetaminophen, albuterol, ondansetron (ZOFRAN) IV   PHYSICAL EXAM: Vital signs: Vitals:   08/08/16 0000 08/08/16 0409 08/08/16 0741 08/08/16 0743  BP: 131/64 121/68 136/76 136/76  Pulse: 68 70 70 70  Resp: (!) 29 18 (!) 21 (!) 23  Temp:  97.5 F (36.4 C)  98 F (36.7 C)  TempSrc:  Oral  Oral  SpO2: 97% 95% 98% 98%  Weight:  70.7 kg (155 lb 13.8 oz)    Height:       Filed Weights   08/06/16 0607 08/07/16 0500 08/08/16 0409  Weight: 71.8 kg (158 lb 6.4 oz) 70.9 kg (156 lb 3.2 oz) 70.7 kg (155 lb 13.8 oz)   Body mass index is 22.05 kg/m.   General appearance :Awake, alert, not in any distress. Speech Clear. Not toxic Looking Eyes:, pupils equally reactive to light and accomodation,no scleral icterus.Pink conjunctiva HEENT: Atraumatic and Normocephalic Neck: supple, no JVD. No cervical lymphadenopathy. No thyromegaly Resp:Good air entry on left without added sounds, decreased breath sounds on right with scattered rales CVS: S1 S2 regular, no murmurs.  GI: Bowel sounds present, Non tender and not  distended with no gaurding, rigidity or rebound.No organomegaly Extremities: B/L Lower Ext shows no edema, both legs are warm to touch Neurology:  speech clear,Non focal, sensation is grossly intact. Psychiatric: Normal judgment and insight. Alert and oriented x 3. Normal mood. Musculoskeletal:No digital cyanosis Skin:No Rash, warm and dry Wounds:N/A  I have personally reviewed following labs and imaging studies  LABORATORY DATA: CBC:  Recent Labs Lab 08/04/16 0708 08/05/16 0449 08/06/16 0511 08/07/16 0404 08/08/16 0231  WBC 12.4* 9.8 9.2 10.3 11.3*  NEUTROABS 9.6* 6.5  --   --   --   HGB 9.6* 9.5* 9.5* 9.9* 10.5*  HCT 31.1* 30.5* 30.1* 31.7* 33.2*  MCV 88.9 88.4 88.5 87.3 87.6  PLT 239 240 237 242 225    Basic Metabolic Panel:  Recent Labs Lab 08/04/16 0708 08/05/16 0449 08/06/16 0639 08/07/16 0404 08/08/16 0231  NA 140 140 137 137 137  K 4.0 3.2* 3.7 3.5 3.9  CL 102 99* 96* 93* 91*  CO2 28 33* 34* 36* 34*  GLUCOSE 178* 140* 168* 128* 214*  BUN 9 10 14 20  22*  CREATININE 0.65 0.74 0.88 1.25* 1.21  CALCIUM 9.2 9.0 8.6* 8.7* 9.0    GFR: Estimated Creatinine Clearance: 42.2 mL/min (by C-G formula based on SCr of 1.21 mg/dL).  Liver Function Tests:  Recent Labs Lab 08/04/16 0708 08/05/16 1023  AST 16 14*  ALT 16* 13*  ALKPHOS 60 60  BILITOT 0.9 1.1  PROT 6.2* 6.1*  ALBUMIN 3.1* 3.1*   No results for input(s): LIPASE, AMYLASE in the last 168 hours. No results for input(s): AMMONIA in the last 168 hours.  Coagulation Profile:  Recent Labs Lab 08/07/16 1211  INR 1.33    Cardiac Enzymes: No results for input(s): CKTOTAL, CKMB, CKMBINDEX, TROPONINI in the last 168 hours.  BNP (last 3 results)  Recent Labs  04/05/16 1102  PROBNP 367.0*    HbA1C: No results for input(s): HGBA1C in the last 72 hours.  CBG:  Recent Labs Lab 08/07/16 1117 08/07/16 1757 08/07/16 2103 08/08/16 0741 08/08/16 0856  GLUCAP 197* 142* 146* 176* 180*     Lipid Profile: No results for input(s): CHOL, HDL, LDLCALC, TRIG, CHOLHDL, LDLDIRECT in the last 72 hours.  Thyroid Function Tests: No results for input(s): TSH, T4TOTAL, FREET4, T3FREE, THYROIDAB in the last 72 hours.  Anemia Panel: No results for input(s): VITAMINB12, FOLATE, FERRITIN, TIBC, IRON, RETICCTPCT in the last 72 hours.  Urine analysis:    Component Value Date/Time   COLORURINE AMBER (A) 06/12/2016 1748   APPEARANCEUR HAZY (A) 06/12/2016 1748   LABSPEC 1.027 06/12/2016 1748   PHURINE 5.0 06/12/2016 1748   GLUCOSEU NEGATIVE 06/12/2016 1748   HGBUR NEGATIVE 06/12/2016 1748   HGBUR negative 04/05/2009 0856   BILIRUBINUR NEGATIVE 06/12/2016 1748   BILIRUBINUR n 09/20/2015 0840   KETONESUR NEGATIVE 06/12/2016 1748   PROTEINUR 100 (A) 06/12/2016 1748   UROBILINOGEN 0.2 09/20/2015 0840   UROBILINOGEN 0.2 04/05/2009 0856   NITRITE NEGATIVE 06/12/2016 1748   LEUKOCYTESUR NEGATIVE 06/12/2016 1748    Sepsis Labs: Lactic Acid, Venous    Component Value Date/Time   LATICACIDVEN 1.23 08/04/2016 1107    MICROBIOLOGY: Recent Results (from the past 240 hour(s))  Blood culture (routine x 2)     Status: None (Preliminary result)   Collection Time: 08/04/16  7:50 AM  Result Value Ref Range Status   Specimen Description BLOOD RIGHT HAND  Final   Special Requests BOTTLES DRAWN AEROBIC AND ANAEROBIC  5CC  Final   Culture NO GROWTH 3 DAYS  Final   Report Status PENDING  Incomplete  Blood culture (routine x 2)     Status: None (Preliminary result)   Collection Time: 08/04/16  8:01 AM  Result Value Ref Range Status   Specimen Description BLOOD RIGHT ANTECUBITAL  Final   Special Requests BOTTLES DRAWN AEROBIC AND ANAEROBIC  5CC  Final   Culture NO GROWTH 3 DAYS  Final   Report Status PENDING  Incomplete  Respiratory Panel by PCR     Status: None   Collection Time: 08/04/16  9:56 AM  Result Value Ref Range Status   Adenovirus NOT DETECTED NOT DETECTED Final   Coronavirus  229E NOT DETECTED NOT DETECTED Final   Coronavirus HKU1 NOT DETECTED NOT DETECTED Final   Coronavirus NL63 NOT DETECTED NOT DETECTED Final   Coronavirus OC43 NOT DETECTED NOT DETECTED Final   Metapneumovirus NOT DETECTED NOT DETECTED Final  Rhinovirus / Enterovirus NOT DETECTED NOT DETECTED Final   Influenza A NOT DETECTED NOT DETECTED Final   Influenza B NOT DETECTED NOT DETECTED Final   Parainfluenza Virus 1 NOT DETECTED NOT DETECTED Final   Parainfluenza Virus 2 NOT DETECTED NOT DETECTED Final   Parainfluenza Virus 3 NOT DETECTED NOT DETECTED Final   Parainfluenza Virus 4 NOT DETECTED NOT DETECTED Final   Respiratory Syncytial Virus NOT DETECTED NOT DETECTED Final   Bordetella pertussis NOT DETECTED NOT DETECTED Final   Chlamydophila pneumoniae NOT DETECTED NOT DETECTED Final   Mycoplasma pneumoniae NOT DETECTED NOT DETECTED Final  MRSA PCR Screening     Status: None   Collection Time: 08/04/16  2:44 PM  Result Value Ref Range Status   MRSA by PCR NEGATIVE NEGATIVE Final    Comment:        The GeneXpert MRSA Assay (FDA approved for NASAL specimens only), is one component of a comprehensive MRSA colonization surveillance program. It is not intended to diagnose MRSA infection nor to guide or monitor treatment for MRSA infections.   Culture, body fluid-bottle     Status: None (Preliminary result)   Collection Time: 08/05/16  1:48 PM  Result Value Ref Range Status   Specimen Description FLUID PLEURAL RIGHT  Final   Special Requests NONE  Final   Culture NO GROWTH 2 DAYS  Final   Report Status PENDING  Incomplete  Gram stain     Status: None   Collection Time: 08/05/16  1:48 PM  Result Value Ref Range Status   Specimen Description FLUID  Final   Special Requests NONE  Final   Gram Stain   Final    WBC PRESENT, PREDOMINANTLY PMN NO ORGANISMS SEEN CYTOSPIN SMEAR    Report Status 08/05/2016 FINAL  Final    RADIOLOGY STUDIES/RESULTS:    LOS: 4 days   Corwin Levins,  PA-S   If 7PM-7AM, please contact night-coverage [email or web address] Password [facility] 08/08/2016, 9:40 AM  Attending MD note  Patient was seen, examined,treatment plan was discussed with the PA-S.  I have personally reviewed the clinical findings, lab, imaging studies and management of this patient in detail. I agree with the documentation, as recorded by the PA-S.   Patient sitting comfortably at bedside-denies any chest pain, claims breathing is okay.Chest tube in place  On Exam: Gen. exam: Awake, alert, not in any distress Chest: Decreased air entry on the right base area-arouse in the right mid lung area. CVS: S1-S2 regular, no murmurs Abdomen: Soft, nontender and nondistended Neurology: Non-focal Skin: No rash or lesions   Impression: Decompensated diastolic heart failure: Improving Acute hypoxemic respiratory failure: Due to CHF and pleural effusion Right-sided pleural effusion: Likely secondary to CHF-transudative by light's criteria Iatrogenic pneumothorax-significantly improved on chest x-ray after insertion of chest tube by radiology on 2/21  Plan Continue Lasix to oral IR to manage chest tube Back to SNF over the next few days  Rest as above

## 2016-08-08 NOTE — Progress Notes (Signed)
PT Cancellation Note  Patient Details Name: Kyle CairoRobert P Scalese MRN: [id number] DOB: 05-10-1926   Cancelled Treatment:    Reason Eval/Treat Not Completed: Patient declined, no reason specified (pt stated he does not wish to mobilize today and he wants to make sure his lungs are ok. Despite education for benefit of mobility and role of therapy pt continued to refuse any activity at this time)   Caylei Sperry B Rick Carruthers 08/08/2016, 1:54 PM  Delaney MeigsMaija Tabor Oyuki Hogan, PT [phone]

## 2016-08-08 NOTE — Progress Notes (Signed)
Follow up after CXR. Still small apical PTX. D/w Dr. Miles CostainShick, will continue suction overnight. Repeat CXR in am, if stable, will convert to water seal.   Brayton ElKevin Meridian Scherger PA-C Interventional Radiology 08/08/2016 3:22 PM

## 2016-08-08 NOTE — Progress Notes (Signed)
Offered Pt a bath, he stated now right now. Will report to oncoming tech that Pt refused and another attempt needs to be made.

## 2016-08-08 NOTE — Care Management Important Message (Signed)
Important Message  Patient Details  Name: Kyle CairoRobert P [name] MRN: [id number] Date of Birth: May 20, 1926   Medicare Important Message Given:  Yes    [name] Abena 08/08/2016, 11:28 AM

## 2016-08-09 ENCOUNTER — Inpatient Hospital Stay (HOSPITAL_COMMUNITY): Payer: Medicare Other

## 2016-08-09 DIAGNOSIS — E0842 Diabetes mellitus due to underlying condition with diabetic polyneuropathy: Secondary | ICD-10-CM

## 2016-08-09 DIAGNOSIS — J90 Pleural effusion, not elsewhere classified: Secondary | ICD-10-CM

## 2016-08-09 LAB — GLUCOSE, CAPILLARY
GLUCOSE-CAPILLARY: 137 mg/dL — AB (ref 65–99)
GLUCOSE-CAPILLARY: 180 mg/dL — AB (ref 65–99)
Glucose-Capillary: 144 mg/dL — ABNORMAL HIGH (ref 65–99)
Glucose-Capillary: 174 mg/dL — ABNORMAL HIGH (ref 65–99)

## 2016-08-09 LAB — CBC
HCT: 32.3 % — ABNORMAL LOW (ref 39.0–52.0)
HEMOGLOBIN: 10.1 g/dL — AB (ref 13.0–17.0)
MCH: 27.2 pg (ref 26.0–34.0)
MCHC: 31.3 g/dL (ref 30.0–36.0)
MCV: 87.1 fL (ref 78.0–100.0)
PLATELETS: 245 10*3/uL (ref 150–400)
RBC: 3.71 MIL/uL — AB (ref 4.22–5.81)
RDW: 15.5 % (ref 11.5–15.5)
WBC: 9.1 10*3/uL (ref 4.0–10.5)

## 2016-08-09 LAB — CULTURE, BLOOD (ROUTINE X 2)
CULTURE: NO GROWTH
Culture: NO GROWTH

## 2016-08-09 LAB — BASIC METABOLIC PANEL
ANION GAP: 7 (ref 5–15)
BUN: 23 mg/dL — AB (ref 6–20)
CHLORIDE: 94 mmol/L — AB (ref 101–111)
CO2: 35 mmol/L — ABNORMAL HIGH (ref 22–32)
Calcium: 9.2 mg/dL (ref 8.9–10.3)
Creatinine, Ser: 1.21 mg/dL (ref 0.61–1.24)
GFR calc Af Amer: 60 mL/min — ABNORMAL LOW (ref >60)
GFR calc non Af Amer: 52 mL/min — ABNORMAL LOW (ref >60)
Glucose, Bld: 149 mg/dL — ABNORMAL HIGH (ref 65–99)
POTASSIUM: 3.6 mmol/L (ref 3.5–5.1)
SODIUM: 136 mmol/L (ref 135–145)

## 2016-08-09 LAB — APTT: APTT: 79 s — AB (ref 24–36)

## 2016-08-09 LAB — HEPARIN LEVEL (UNFRACTIONATED): Heparin Unfractionated: 0.67 IU/mL (ref 0.30–0.70)

## 2016-08-09 MED ORDER — FUROSEMIDE 40 MG PO TABS
40.0000 mg | ORAL_TABLET | Freq: Two times a day (BID) | ORAL | Status: DC
  Administered 2016-08-09 – 2016-08-13 (×8): 40 mg via ORAL
  Filled 2016-08-09 (×8): qty 1

## 2016-08-09 NOTE — Progress Notes (Signed)
ANTICOAGULATION CONSULT NOTE - Follow Up Consult  Pharmacy Consult:  Heparin Indication: atrial fibrillation  No Known Allergies  Patient Measurements: Height: 5' 10.5" (179.1 cm) Weight: 163 lb 1.6 oz (74 kg) IBW/kg (Calculated) : 74.15 Heparin Dosing Weight: 71 kg  Vital Signs: Temp: 98.7 F (37.1 C) (02/23 0419) Temp Source: Oral (02/23 0419) BP: 148/72 (02/23 0843) Pulse Rate: 69 (02/23 0846)  Labs:  Recent Labs  08/07/16 0404 08/07/16 1211 08/08/16 0231 08/08/16 0925 08/09/16 0422  HGB 9.9*  --  10.5*  --  10.1*  HCT 31.7*  --  33.2*  --  32.3*  PLT 242  --  225  --  245  APTT 59*  --  64* 79* 79*  LABPROT  --  16.6*  --   --   --   INR  --  1.33  --   --   --   HEPARINUNFRC 1.14*  --  0.91* 0.85* 0.67  CREATININE 1.25*  --  1.21  --  1.21    Estimated Creatinine Clearance: 44.2 mL/min (by C-G formula based on SCr of 1.21 mg/dL).    Assessment: 89 YOM on Eliquis PTA for history of AFib, which was placed on hold for thoracentesis on 08/05/16 and then resumed that evening.  He was switched to IV heparin on 08/06/16.  Now s/p chest tube placement and restarted heparin. Currently using aPTT to guide heparin dosing since Eliquis is falsely elevating heparin levels.  aPTT = 79, therapeutic, anti-Xa level 0.67, still slightly affected by eliquis, but trending down. CBC stable, No bleeding noted per chart.   Goal of Therapy:  Heparin level 0.3-0.7 units/ml aPTT 66 - 102 seconds Monitor platelets by anticoagulation protocol: Yes    Plan:  - Continue IV heparin 1250 units/hr - Monitor closely for s/sx bleeding - Monitor anti-Xa level for heparin dosing tomorrow morning  Bayard HuggerMei Isac Lincks, PharmD, BCPS  Clinical Pharmacist  Pager: [phone]   08/09/2016, 9:20 AM

## 2016-08-09 NOTE — Progress Notes (Signed)
Referring Physician(s): DR Windell Norfolk  Supervising Physician: Ruel Favors  Patient Status:  [facility] - In-pt  Chief Complaint:  CT guided Right Chest tube placement 2/21 in IR  Subjective:  Rt PTX after Rt thoracentesis 2/19 Chest tube in place  Denies pain Breathing well Actively having breathing treatment  CXR today: IMPRESSION: 1. Stable right chest tube.  No residual pneumothorax. 2. Suspect some reaccumulation of partially loculated right pleural fluid. 3. Continued patchy right lung and dense retrocardiac opacity compatible with pneumonia.    Allergies: Patient has no known allergies.  Medications: Prior to Admission medications   Medication Sig Start Date End Date Taking? Authorizing Provider  albuterol (PROVENTIL,VENTOLIN) 90 MCG/ACT inhaler Inhale 2 puffs into the lungs 2 (two) times daily.     Yes Historical Provider, MD  apixaban (ELIQUIS) 2.5 MG TABS tablet Take 1 tablet (2.5 mg total) by mouth 2 (two) times daily. 11/23/15  Yes Newman Nip, NP  doxazosin (CARDURA) 4 MG tablet TAKE 1 TABLET EACH DAY. 03/07/16  Yes Gordy Savers, MD  escitalopram (LEXAPRO) 10 MG tablet Take 10 mg by mouth daily.   Yes Historical Provider, MD  Fluticasone Propionate, Inhal, (FLOVENT DISKUS) 100 MCG/BLIST AEPB Inhale 1 puff into the lungs 2 (two) times daily.   Yes Historical Provider, MD  latanoprost (XALATAN) 0.005 % ophthalmic solution 1 drop at bedtime.   Yes Historical Provider, MD  lisinopril (PRINIVIL,ZESTRIL) 40 MG tablet TAKE 1 TABLET ONCE DAILY. 08/17/15  Yes Gordy Savers, MD  metFORMIN (GLUCOPHAGE-XR) 500 MG 24 hr tablet Take 1,500 mg by mouth daily with breakfast.   Yes Historical Provider, MD  metoprolol succinate (TOPROL-XL) 50 MG 24 hr tablet Take 1/2 tablet by mouth daily (25mg ) 11/02/15  Yes Newman Nip, NP  doxycycline (VIBRAMYCIN) 100 MG capsule Take 1 capsule (100 mg total) by mouth 2 (two) times daily. Patient not taking: Reported on  08/04/2016 06/13/16   [name], MD  furosemide (LASIX) 40 MG tablet 1 half tablet daily as needed for fluid control 05/14/16   Gordy Savers, MD  glucose blood (PRECISION XTRA TEST STRIPS) test strip 1 each by Other route daily as needed for other. Use as instructed 06/24/13   Gordy Savers, MD     Vital Signs: BP (!) 148/72   Pulse 69   Temp 98.7 F (37.1 C) (Oral)   Resp (!) 22   Ht 5' 10.5" (1.791 m)   Wt 163 lb 1.6 oz (74 kg)   SpO2 96%   BMI 23.07 kg/m   Physical Exam  Constitutional: He is oriented to person, place, and time.  Pulmonary/Chest: He has rales.  Neurological: He is alert and oriented to person, place, and time.  Skin: Skin is warm and dry.  Site of Rt chest tube is clean and dry Sl tender 1510 cc serous fluid in Pleurvac No air leak  CXR shows NO PTX today   Psychiatric: He has a normal mood and affect. His behavior is normal.  Nursing note and vitals reviewed.   Imaging:   Labs:  CBC:  Recent Labs  08/06/16 0511 08/07/16 0404 08/08/16 0231 08/09/16 0422  WBC 9.2 10.3 11.3* 9.1  HGB 9.5* 9.9* 10.5* 10.1*  HCT 30.1* 31.7* 33.2* 32.3*  PLT 237 242 225 245    COAGS:  Recent Labs  08/07/16 0404 08/07/16 1211 08/08/16 0231 08/08/16 0925 08/09/16 0422  INR  --  1.33  --   --   --  APTT 59*  --  64* 79* 79*    BMP:  Recent Labs  08/06/16 0639 08/07/16 0404 08/08/16 0231 08/09/16 0422  NA 137 137 137 136  K 3.7 3.5 3.9 3.6  CL 96* 93* 91* 94*  CO2 34* 36* 34* 35*  GLUCOSE 168* 128* 214* 149*  BUN 14 20 22* 23*  CALCIUM 8.6* 8.7* 9.0 9.2  CREATININE 0.88 1.25* 1.21 1.21  GFRNONAA >60 50* 52* 52*  GFRAA >60 57* 60* 60*    LIVER FUNCTION TESTS:  Recent Labs  04/05/16 1102 06/12/16 2316 08/04/16 0708 08/05/16 1023  BILITOT 0.7 0.8 0.9 1.1  AST 13 16 16  14*  ALT 10 13* 16* 13*  ALKPHOS 71 63 60 60  PROT 7.3 6.7 6.2* 6.1*  ALBUMIN 4.0 3.7 3.1* 3.1*    Assessment and Plan:  Water seal chest tube  today per Dr Miles CostainShick Will recheck CXR in am  Electronically Signed: Latresha Yahr A 08/09/2016, 9:30 AM   I spent a total of 15 Minutes at the the patient's bedside AND on the patient's hospital floor or unit, greater than 50% of which was counseling/coordinating care for Rt PTX/Chest tube

## 2016-08-09 NOTE — Progress Notes (Signed)
PROGRESS NOTE        PATIENT DETAILS Name: Kyle CairoRobert P Opdahl Age: 83 y.o. Sex: male Date of Birth: [birth year] Admit Date: 08/04/2016 Admitting Physician Haydee SalterPhillip M Hobbs, MD EAV:[id number],GNFAOPCP:[id number],PETER [name], MD  Brief Narrative: Patient is a 83 y.o. male with history of atrial fibrillation, trifascicular block and pacemaker placement, HTN, hyperlipidemia, diabetes, COPD, OSA, diastolic heart failure, and pulmonary HTN. He was admitted for acute respiratory failure with hypoxia due to bilateral transudative pleural effusion, worse on right. This is likely due to chronic diastolic CHF. He developed a small pneumothorax on the right s/p ultrasound-guided thoracentesis on 08/05/16. Chest tube placed on 08/07/16, air leak appears to be resolved. CXR shows no residual pneumothorax, but some reaccumulation of partially loculated right pleural fluid. Will repeat CXR tomorrow and plan for chest tube removal within the next few days.   Subjective: Patient is doing well without dyspnea or chest pain.   Assessment/Plan: Acute respiratory failure with hypoxia due to bilateral pleural effusion and acute diastolic heart failure: Better, appears more compensated, weight decreased to 163 pounds (171 pounds on admission) and -10 L since admission. Continue Lasix.  Right pleural effusion: Likely secondary to heart failure-transudative by light's criteria. Continue diuresis-underwent thoracocentesis on 2/19.  Right-sided pneumothorax: Iatrogenic-likely due to recent thoracocentesis. Chest tube placed on 2/21, no further air leak today. CXR shows no residual pneumothorax. Patient remains asymptomatic. Will await further recommendations from radiology for chest tube removal.  Possible pneumonia: Likely has atelectasis due to effusion, no fever, had mild leukocytosis, appears nontoxic-Levaquin stopped 2/22.  Type 2 Diabetes Mellitus: CBGs minimally elevated- Continue sliding scale insulin. Plan on  resuming oral medications on discharge  Dyslipidemia: Continue simvastatin.   Hypertension: Controlled- Continue metoprolol and lisinopril.   COPD: Stable without any signs of exacerbation-continue albuterol neb.   Chronic Atrial fibrillation: Eliquis switched to heparin-continue for now till chest tube removed. Continue metoprolol.[place] vasc 2 score of at least 4   OSA. BiPAP daily at bedtime.   DVT Prophylaxis: Heparin   Code Status: DNR  Family Communication: Spoke with the daughter at bedside on 2/22  Disposition Plan: Remain inpatient- SNF on discharge-once Chest tube has been removed.  Antimicrobial agents: Anti-infectives    Start     Dose/Rate Route Frequency Ordered Stop   08/08/16 1400  levofloxacin (LEVAQUIN) tablet 750 mg  Status:  Discontinued     750 mg Oral Every 48 hours 08/07/16 1102 08/08/16 1140   08/06/16 1400  levofloxacin (LEVAQUIN) tablet 750 mg  Status:  Discontinued     750 mg Oral Every 24 hours 08/06/16 1253 08/07/16 1102   08/05/16 0230  vancomycin (VANCOCIN) IVPB 1000 mg/200 mL premix  Status:  Discontinued     1,000 mg 200 mL/hr over 60 Minutes Intravenous Every 12 hours 08/04/16 1416 08/06/16 1205   08/04/16 1430  ceFEPIme (MAXIPIME) 1 g in dextrose 5 % 50 mL IVPB  Status:  Discontinued     1 g 100 mL/hr over 30 Minutes Intravenous Every 8 hours 08/04/16 1407 08/06/16 1205   08/04/16 1430  vancomycin (VANCOCIN) 1,750 mg in sodium chloride 0.9 % 500 mL IVPB     1,750 mg 250 mL/hr over 120 Minutes Intravenous  Once 08/04/16 1416 08/04/16 1751   08/04/16 0830  vancomycin (VANCOCIN) 1,250 mg in sodium chloride 0.9 % 250 mL IVPB  1,250 mg 166.7 mL/hr over 90 Minutes Intravenous  Once 08/04/16 0758 08/04/16 1020   08/04/16 0800  piperacillin-tazobactam (ZOSYN) IVPB 3.375 g  Status:  Discontinued     3.375 g 12.5 mL/hr over 240 Minutes Intravenous Every 8 hours 08/04/16 0741 08/04/16 0745   08/04/16 0800  piperacillin-tazobactam (ZOSYN) IVPB  3.375 g     3.375 g 100 mL/hr over 30 Minutes Intravenous  Once 08/04/16 0758 08/04/16 0850   08/04/16 0745  vancomycin (VANCOCIN) 1,250 mg in sodium chloride 0.9 % 250 mL IVPB  Status:  Discontinued     1,250 mg 166.7 mL/hr over 90 Minutes Intravenous  Once 08/04/16 0741 08/04/16 0745      Procedures: 2/21>>Chest tube placement  CONSULTS:  Radiology  Time spent: 25 minutes-Greater than 50% of this time was spent in counseling, explanation of diagnosis, planning of further management, and coordination of care.  MEDICATIONS: Scheduled Meds: . albuterol  2.5 mg Nebulization TID  . doxazosin  4 mg Oral Daily  . escitalopram  10 mg Oral Daily  . furosemide  40 mg Oral Daily  . insulin aspart  0-5 Units Subcutaneous QHS  . insulin aspart  0-9 Units Subcutaneous TID WC  . latanoprost  1 drop Both Eyes QHS  . lisinopril  20 mg Oral Daily  . mouth rinse  15 mL Mouth Rinse BID  . metoprolol succinate  25 mg Oral Daily  . nitroGLYCERIN  0.5 inch Topical Q6H  . simvastatin  20 mg Oral QHS   Continuous Infusions: . heparin 1,250 Units/hr (08/09/16 0045)   PRN Meds:.acetaminophen, albuterol, ondansetron (ZOFRAN) IV   PHYSICAL EXAM: Vital signs: Vitals:   08/09/16 0419 08/09/16 0843 08/09/16 0846 08/09/16 0923  BP: 131/64 (!) 148/72    Pulse: 69  69   Resp: (!) 22     Temp: 98.7 F (37.1 C)     TempSrc: Oral     SpO2: 97%   96%  Weight: 74 kg (163 lb 1.6 oz)     Height:       Filed Weights   08/07/16 0500 08/08/16 0409 08/09/16 0419  Weight: 70.9 kg (156 lb 3.2 oz) 70.7 kg (155 lb 13.8 oz) 74 kg (163 lb 1.6 oz)   Body mass index is 23.07 kg/m.   General appearance :Awake, alert, not in any distress. Speech Clear. Not toxic Looking Eyes:, pupils equally reactive to light and accomodation,no scleral icterus.Pink conjunctiva HEENT: Atraumatic and Normocephalic Neck: supple, no JVD. No cervical lymphadenopathy. No thyromegaly Resp:Good air entry on left without added  sounds, decreased breath sounds on right with scattered rales CVS: S1 S2 regular, no murmurs.  GI: Bowel sounds present, Non tender and not distended with no gaurding, rigidity or rebound.No organomegaly Extremities: B/L Lower Ext shows no edema, both legs are warm to touch Neurology:  speech clear,Non focal, sensation is grossly intact. Psychiatric: Normal judgment and insight. Alert and oriented x 3. Normal mood. Musculoskeletal:No digital cyanosis Skin:No Rash, warm and dry Wounds:N/A  I have personally reviewed following labs and imaging studies  LABORATORY DATA: CBC:  Recent Labs Lab 08/04/16 0708 08/05/16 0449 08/06/16 0511 08/07/16 0404 08/08/16 0231 08/09/16 0422  WBC 12.4* 9.8 9.2 10.3 11.3* 9.1  NEUTROABS 9.6* 6.5  --   --   --   --   HGB 9.6* 9.5* 9.5* 9.9* 10.5* 10.1*  HCT 31.1* 30.5* 30.1* 31.7* 33.2* 32.3*  MCV 88.9 88.4 88.5 87.3 87.6 87.1  PLT 239 240 237 242 225 245  Basic Metabolic Panel:  Recent Labs Lab 08/05/16 0449 08/06/16 0639 08/07/16 0404 08/08/16 0231 08/09/16 0422  NA 140 137 137 137 136  K 3.2* 3.7 3.5 3.9 3.6  CL 99* 96* 93* 91* 94*  CO2 33* 34* 36* 34* 35*  GLUCOSE 140* 168* 128* 214* 149*  BUN 10 14 20  22* 23*  CREATININE 0.74 0.88 1.25* 1.21 1.21  CALCIUM 9.0 8.6* 8.7* 9.0 9.2    GFR: Estimated Creatinine Clearance: 44.2 mL/min (by C-G formula based on SCr of 1.21 mg/dL).  Liver Function Tests:  Recent Labs Lab 08/04/16 0708 08/05/16 1023  AST 16 14*  ALT 16* 13*  ALKPHOS 60 60  BILITOT 0.9 1.1  PROT 6.2* 6.1*  ALBUMIN 3.1* 3.1*   No results for input(s): LIPASE, AMYLASE in the last 168 hours. No results for input(s): AMMONIA in the last 168 hours.  Coagulation Profile:  Recent Labs Lab 08/07/16 1211  INR 1.33    Cardiac Enzymes: No results for input(s): CKTOTAL, CKMB, CKMBINDEX, TROPONINI in the last 168 hours.  BNP (last 3 results)  Recent Labs  04/05/16 1102  PROBNP 367.0*    HbA1C: No  results for input(s): HGBA1C in the last 72 hours.  CBG:  Recent Labs Lab 08/08/16 0856 08/08/16 1203 08/08/16 1647 08/08/16 2052 08/09/16 0822  GLUCAP 180* 185* 155* 142* 144*    Lipid Profile: No results for input(s): CHOL, HDL, LDLCALC, TRIG, CHOLHDL, LDLDIRECT in the last 72 hours.  Thyroid Function Tests: No results for input(s): TSH, T4TOTAL, FREET4, T3FREE, THYROIDAB in the last 72 hours.  Anemia Panel: No results for input(s): VITAMINB12, FOLATE, FERRITIN, TIBC, IRON, RETICCTPCT in the last 72 hours.  Urine analysis:    Component Value Date/Time   COLORURINE AMBER (A) 06/12/2016 1748   APPEARANCEUR HAZY (A) 06/12/2016 1748   LABSPEC 1.027 06/12/2016 1748   PHURINE 5.0 06/12/2016 1748   GLUCOSEU NEGATIVE 06/12/2016 1748   HGBUR NEGATIVE 06/12/2016 1748   HGBUR negative 04/05/2009 0856   BILIRUBINUR NEGATIVE 06/12/2016 1748   BILIRUBINUR n 09/20/2015 0840   KETONESUR NEGATIVE 06/12/2016 1748   PROTEINUR 100 (A) 06/12/2016 1748   UROBILINOGEN 0.2 09/20/2015 0840   UROBILINOGEN 0.2 04/05/2009 0856   NITRITE NEGATIVE 06/12/2016 1748   LEUKOCYTESUR NEGATIVE 06/12/2016 1748    Sepsis Labs: Lactic Acid, Venous    Component Value Date/Time   LATICACIDVEN 1.23 08/04/2016 1107    MICROBIOLOGY: Recent Results (from the past 240 hour(s))  Blood culture (routine x 2)     Status: None (Preliminary result)   Collection Time: 08/04/16  7:50 AM  Result Value Ref Range Status   Specimen Description BLOOD RIGHT HAND  Final   Special Requests BOTTLES DRAWN AEROBIC AND ANAEROBIC  5CC  Final   Culture NO GROWTH 4 DAYS  Final   Report Status PENDING  Incomplete  Blood culture (routine x 2)     Status: None (Preliminary result)   Collection Time: 08/04/16  8:01 AM  Result Value Ref Range Status   Specimen Description BLOOD RIGHT ANTECUBITAL  Final   Special Requests BOTTLES DRAWN AEROBIC AND ANAEROBIC  5CC  Final   Culture NO GROWTH 4 DAYS  Final   Report Status  PENDING  Incomplete  Respiratory Panel by PCR     Status: None   Collection Time: 08/04/16  9:56 AM  Result Value Ref Range Status   Adenovirus NOT DETECTED NOT DETECTED Final   Coronavirus 229E NOT DETECTED NOT DETECTED Final   Coronavirus HKU1  NOT DETECTED NOT DETECTED Final   Coronavirus NL63 NOT DETECTED NOT DETECTED Final   Coronavirus OC43 NOT DETECTED NOT DETECTED Final   Metapneumovirus NOT DETECTED NOT DETECTED Final   Rhinovirus / Enterovirus NOT DETECTED NOT DETECTED Final   Influenza A NOT DETECTED NOT DETECTED Final   Influenza B NOT DETECTED NOT DETECTED Final   Parainfluenza Virus 1 NOT DETECTED NOT DETECTED Final   Parainfluenza Virus 2 NOT DETECTED NOT DETECTED Final   Parainfluenza Virus 3 NOT DETECTED NOT DETECTED Final   Parainfluenza Virus 4 NOT DETECTED NOT DETECTED Final   Respiratory Syncytial Virus NOT DETECTED NOT DETECTED Final   Bordetella pertussis NOT DETECTED NOT DETECTED Final   Chlamydophila pneumoniae NOT DETECTED NOT DETECTED Final   Mycoplasma pneumoniae NOT DETECTED NOT DETECTED Final  MRSA PCR Screening     Status: None   Collection Time: 08/04/16  2:44 PM  Result Value Ref Range Status   MRSA by PCR NEGATIVE NEGATIVE Final    Comment:        The GeneXpert MRSA Assay (FDA approved for NASAL specimens only), is one component of a comprehensive MRSA colonization surveillance program. It is not intended to diagnose MRSA infection nor to guide or monitor treatment for MRSA infections.   Culture, body fluid-bottle     Status: None (Preliminary result)   Collection Time: 08/05/16  1:48 PM  Result Value Ref Range Status   Specimen Description FLUID PLEURAL RIGHT  Final   Special Requests NONE  Final   Culture NO GROWTH 3 DAYS  Final   Report Status PENDING  Incomplete  Gram stain     Status: None   Collection Time: 08/05/16  1:48 PM  Result Value Ref Range Status   Specimen Description FLUID  Final   Special Requests NONE  Final   Gram  Stain   Final    WBC PRESENT, PREDOMINANTLY PMN NO ORGANISMS SEEN CYTOSPIN SMEAR    Report Status 08/05/2016 FINAL  Final    RADIOLOGY STUDIES/RESULTS:    LOS: 5 days   Corwin Levins, PA-S   If 7PM-7AM, please contact night-coverage [email or web address] Password [facility] 08/09/2016, 10:16 AM  Attending MD note  Patient was seen, examined,treatment plan was discussed with the PA-S.  I have personally reviewed the clinical findings, lab, imaging studies and management of this patient in detail. I agree with the documentation, as recorded by the PA-S.   Patient sitting comfortably at bedside-denies any chest pain, claims breathing is okay.Chest tube in place  On Exam: Gen. exam: Awake, alert, not in any distress Chest: Decreased air entry on the right base area-arouse in the right mid lung area. CVS: S1-S2 regular, no murmurs Abdomen: Soft, nontender and nondistended Neurology: Non-focal Skin: No rash or lesions   Impression: Decompensated diastolic heart failure: Improving Acute hypoxemic respiratory failure: Due to CHF and pleural effusion Right-sided pleural effusion: Likely secondary to CHF-transudative by light's criteria Iatrogenic pneumothorax-significantly improved on chest x-ray after insertion of chest tube by radiology on 2/21  Plan Continue Lasix  IR planning on changing chest tube to waterseal Back to SNF over the next few days  Rest as above

## 2016-08-09 NOTE — Progress Notes (Signed)
Pt has stated in front of this RN that from now on he would like his daughter Fredirick MaudlinLisa Cooper do all the decision making regarding his care as well as sign anything pertaining to his care. She is also POA .

## 2016-08-10 ENCOUNTER — Inpatient Hospital Stay (HOSPITAL_COMMUNITY): Payer: Medicare Other

## 2016-08-10 DIAGNOSIS — G4733 Obstructive sleep apnea (adult) (pediatric): Secondary | ICD-10-CM

## 2016-08-10 LAB — CBC
HCT: 32.6 % — ABNORMAL LOW (ref 39.0–52.0)
Hemoglobin: 10.4 g/dL — ABNORMAL LOW (ref 13.0–17.0)
MCH: 27.6 pg (ref 26.0–34.0)
MCHC: 31.9 g/dL (ref 30.0–36.0)
MCV: 86.5 fL (ref 78.0–100.0)
PLATELETS: 242 10*3/uL (ref 150–400)
RBC: 3.77 MIL/uL — ABNORMAL LOW (ref 4.22–5.81)
RDW: 15.9 % — AB (ref 11.5–15.5)
WBC: 10 10*3/uL (ref 4.0–10.5)

## 2016-08-10 LAB — CULTURE, BODY FLUID-BOTTLE: CULTURE: NO GROWTH

## 2016-08-10 LAB — GLUCOSE, CAPILLARY
GLUCOSE-CAPILLARY: 169 mg/dL — AB (ref 65–99)
Glucose-Capillary: 158 mg/dL — ABNORMAL HIGH (ref 65–99)
Glucose-Capillary: 119 mg/dL — ABNORMAL HIGH (ref 65–99)
Glucose-Capillary: 148 mg/dL — ABNORMAL HIGH (ref 65–99)
Glucose-Capillary: 152 mg/dL — ABNORMAL HIGH (ref 65–99)

## 2016-08-10 LAB — HEPARIN LEVEL (UNFRACTIONATED): HEPARIN UNFRACTIONATED: 0.6 [IU]/mL (ref 0.30–0.70)

## 2016-08-10 MED ORDER — POLYETHYLENE GLYCOL 3350 17 G PO PACK
17.0000 g | PACK | Freq: Every day | ORAL | Status: DC
  Administered 2016-08-10 – 2016-08-15 (×6): 17 g via ORAL
  Filled 2016-08-10 (×6): qty 1

## 2016-08-10 MED ORDER — BISACODYL 5 MG PO TBEC
5.0000 mg | DELAYED_RELEASE_TABLET | Freq: Every day | ORAL | Status: DC | PRN
  Administered 2016-08-10 – 2016-08-12 (×2): 5 mg via ORAL
  Filled 2016-08-10 (×2): qty 1

## 2016-08-10 MED ORDER — SENNOSIDES-DOCUSATE SODIUM 8.6-50 MG PO TABS: 2.0000 | ORAL_TABLET | Freq: Every evening | ORAL | Status: DC | PRN

## 2016-08-10 NOTE — Plan of Care (Signed)
Problem: Safety: Goal: Ability to remain free from injury will improve Outcome: Progressing Patient educated on the importance of using telephone and/or call bell to call for assistance. Patient understanding and agreeable to education

## 2016-08-10 NOTE — Progress Notes (Signed)
ANTICOAGULATION CONSULT NOTE - Follow Up Consult  Pharmacy Consult:  Heparin Indication: atrial fibrillation  No Known Allergies  Patient Measurements: Height: 5' 10.5" (179.1 cm) Weight: 160 lb (72.6 kg) IBW/kg (Calculated) : 74.15 Heparin Dosing Weight: 71 kg  Vital Signs: Temp: 97.9 F (36.6 C) (02/24 0500) Temp Source: Oral (02/24 0500) BP: 165/73 (02/24 0500) Pulse Rate: 69 (02/24 0500)  Labs:  Recent Labs  08/07/16 1211  08/08/16 0231 08/08/16 0925 08/09/16 0422 08/10/16 0342  HGB  --   < > 10.5*  --  10.1* 10.4*  HCT  --   --  33.2*  --  32.3* 32.6*  PLT  --   --  225  --  245 242  APTT  --   --  64* 79* 79*  --   LABPROT 16.6*  --   --   --   --   --   INR 1.33  --   --   --   --   --   HEPARINUNFRC  --   < > 0.91* 0.85* 0.67 0.60  CREATININE  --   --  1.21  --  1.21  --   < > = values in this interval not displayed.  Estimated Creatinine Clearance: 43.3 mL/min (by C-G formula based on SCr of 1.21 mg/dL).    Assessment: 89 YOM on Eliquis PTA for history of AFib, which was placed on hold for thoracentesis on 08/05/16 and then resumed that evening.  He was switched to IV heparin on 08/06/16.  Now s/p chest tube placement and restarted heparin. First using APTT to guide heparin dosing given eliquis's influence on anti-Xa levels. Anti-Xa levels now correlating with APTT and will transition to anti-Xa levels (heparin levels) to guide heparin dosing.   Heparin level therapeutic at 0.6 on 1250 units/hr. CBC stable. No s/sx bleeding noted.   Goal of Therapy:  Heparin level 0.3-0.7 units/ml Monitor platelets by anticoagulation protocol: Yes    Plan:  - Continue IV heparin 1250 units/hr - Monitor closely for s/sx bleeding -Heparin levels daily -Follow up transition to PO AC  Allena Katzaroline E Mckenzy Salazar, Pharm.D. PGY1 Pharmacy Resident 2/24/201810:26 AM Pager [phone]

## 2016-08-10 NOTE — Progress Notes (Signed)
Referring Physician(s): Dr Windell NorfolkS Ghimire  Supervising Physician: Ruel FavorsShick, Trevor  [facility]  [name]  Chief Complaint:  [name]  Subjective:  Kyle [name] is doing ok. He seems very frustrated about having to be in the hospital with the chest tube. He was hoping to have it removed today, unfortunately we have to place it back to suction.  Allergies: Patient has no known allergies.  Medications: Prior to Admission medications   Medication Sig Start Date End Date Taking? Authorizing Provider  albuterol (PROVENTIL,VENTOLIN) 90 MCG/ACT inhaler Inhale 2 puffs into the lungs 2 (two) times daily.     Yes Historical Provider, MD  apixaban (ELIQUIS) 2.5 MG TABS tablet Take 1 tablet (2.5 mg total) by mouth 2 (two) times daily. 11/23/15  Yes Newman Niponna C Carroll, NP  doxazosin (CARDURA) 4 MG tablet TAKE 1 TABLET EACH DAY. 03/07/16  Yes Gordy SaversPeter F Kwiatkowski, MD  escitalopram (LEXAPRO) 10 MG tablet Take 10 mg by mouth daily.   Yes Historical Provider, MD  Fluticasone Propionate, Inhal, (FLOVENT DISKUS) 100 MCG/BLIST AEPB Inhale 1 puff into the lungs 2 (two) times daily.   Yes Historical Provider, MD  latanoprost (XALATAN) 0.005 % ophthalmic solution 1 drop at bedtime.   Yes Historical Provider, MD  lisinopril (PRINIVIL,ZESTRIL) 40 MG tablet TAKE 1 TABLET ONCE DAILY. 08/17/15  Yes Gordy SaversPeter F Kwiatkowski, MD  metFORMIN (GLUCOPHAGE-XR) 500 MG 24 hr tablet Take 1,500 mg by mouth daily with breakfast.   Yes Historical Provider, MD  metoprolol succinate (TOPROL-XL) 50 MG 24 hr tablet Take 1/2 tablet by mouth daily (25mg ) 11/02/15  Yes Newman Niponna C Carroll, NP  doxycycline (VIBRAMYCIN) 100 MG capsule Take 1 capsule (100 mg total) by mouth 2 (two) times daily. Patient not taking: Reported on 08/04/2016 06/13/16   Maia PlanJoshua G Long, MD  furosemide (LASIX) 40 MG tablet 1 half tablet daily as needed for fluid control 05/14/16   Gordy SaversPeter F Kwiatkowski, MD  glucose blood (PRECISION XTRA TEST STRIPS) test strip 1  each by Other route daily as needed for other. Use as instructed 06/24/13   Gordy SaversPeter F Kwiatkowski, MD     Vital Signs: BP 132/65   Pulse 70   Temp 98.9 F (37.2 C) (Oral)   Resp (!) 25   Ht 5' 10.5" (1.791 m)   Wt 160 lb (72.6 kg)   SpO2 96%   BMI 22.63 kg/m   Physical Exam Awake, lying in bed Chest tube in place - no air leak ~ 50 mL output overnight. PTX slightly larger on CXR today Reviewed by Dr. Miles CostainShick Chest now back to suction at 20 mmHg  Imaging: Dg Chest Port 1 View  Result Date: 08/10/2016 CLINICAL DATA:  Pneumothorax. EXAM: PORTABLE CHEST 1 VIEW COMPARISON:  One-view chest x-ray 08/09/2016 FINDINGS: The heart is enlarged. Left subclavian pacemaker wires are in place. A right pleural effusion and airspace disease is similar the prior exam. A small right-sided pneumothorax has increased. Right-sided chest tube remains in place. There is no left-sided pneumothorax. A small left pleural effusion is stable. IMPRESSION: 1. Re- emergence of small right-sided pneumothorax of 5-10% despite chest tube in place. 2. Large right pleural effusion and airspace disease on the right persists. Electronically Signed   By: Marin Robertshristopher  Mattern M.D.   On: 08/10/2016 09:03   Dg Chest Port 1 View  Result Date: 08/09/2016 CLINICAL DATA:  83 y/o male with multilobar pneumonia and bilateral pleural effusions. Right pneumothorax following thoracentesis. Initial encounter. EXAM: PORTABLE CHEST 1  VIEW COMPARISON:  08/08/2016 and earlier. FINDINGS: Portable AP semi upright view at 0744 hours. Small caliber right chest tube remains in place. No residual right pneumothorax. Increased peripheral right lung opacity which may reflect loculated pleural fluid. Continued patchy and confluent right lower lung opacity. Continued confluent retrocardiac opacity. Stable cardiomegaly and mediastinal contours. Calcified aortic atherosclerosis. Stable left chest stool lead cardiac pacemaker. Visualized tracheal air column is  within normal limits. IMPRESSION: 1. Stable right chest tube.  No residual pneumothorax. 2. Suspect some reaccumulation of partially loculated right pleural fluid. 3. Continued patchy right lung and dense retrocardiac opacity compatible with pneumonia. Electronically Signed   By: Odessa Fleming M.D.   On: 08/09/2016 08:03   Dg Chest Port 1 View  Result Date: 08/08/2016 CLINICAL DATA:  Chest tube placement for pneumothorax EXAM: PORTABLE CHEST 1 VIEW COMPARISON:  August 07, 2016 FINDINGS: A chest tube has been placed on the right. The right pneumothorax has largely resolved with only a minimal apical pneumothorax currently evident. No tension component. There is widespread interstitial edema with patchy airspace consolidation in both mid and lower lung zones. There are small pleural effusions bilaterally. There is cardiomegaly. The pulmonary vascularity is within normal limits. Pacemaker leads are attached to the right atrium and right ventricle. There is atherosclerotic calcification in the aorta. No bone lesions. IMPRESSION: Only minimal pneumothorax on the right following chest tube placement. There is interstitial and alveolar edema bilaterally in a pattern suggesting congestive heart failure. There may be some re-expansion phenomenon on the right as well. There is questionable superimposed pneumonia in the bases, particular in the left lower lobe. Stable cardiomegaly. There is aortic atherosclerosis. Electronically Signed   By: Bretta Bang III M.D.   On: 08/08/2016 09:03   Dg Chest Port 1 View  Result Date: 08/07/2016 CLINICAL DATA:  Shortness of breath.  Known right-sided pneumothorax EXAM: PORTABLE CHEST 1 VIEW COMPARISON:  August 06, 2016 FINDINGS: There is a pneumothorax on the right which appears overall slightly larger. No tension component. There is a airspace consolidation on the right with moderate right pleural effusion. Left lung is hyperexpanded. Atelectatic change in the left lower lobe  is stable. There is cardiomegaly, stable. Pacemaker leads are attached to the right atrium and right ventricle. There is aortic atherosclerosis. There is degenerative change in each shoulder. IMPRESSION: The known right-sided pneumothorax is slightly larger but without tension component. There is persistent effusion on the right as well as consolidation throughout much of the aerated right lung. There is atelectatic change in the left base, stable. Lungs elsewhere clear. Stable cardiac enlargement. There is aortic atherosclerosis. These results will be called to the ordering clinician or representative by the Radiologist Assistant, and communication documented in the PACS or zVision Dashboard. Electronically Signed   By: Bretta Bang III M.D.   On: 08/07/2016 07:47   Ct Image Guided Drainage By Percutaneous Catheter  Result Date: 08/07/2016 INDICATION: Respiratory insufficiency. Enlarging right hydropneumothorax post thoracentesis. EXAM: CT IMAGE GUIDED RIGHT CHEST DRAINAGE BY PERCUTANEOUS CATHETER MEDICATIONS: The patient is currently admitted to the hospital and receiving intravenous antibiotics. The antibiotics were administered within an appropriate time frame prior to the initiation of the procedure. ANESTHESIA/SEDATION: Intravenous Fentanyl and Versed were administered as conscious sedation during continuous monitoring of the patient's level of consciousness and physiological / cardiorespiratory status by the radiology RN, with a total moderate sedation time of 22 minutes. COMPLICATIONS: None immediate. PROCEDURE: Informed written consent was obtained from the patient after a thorough  discussion of the procedural risks, benefits and alternatives. All questions were addressed. Maximal Sterile Barrier Technique was utilized including caps, mask, sterile gowns, sterile gloves, sterile drape, hand hygiene and skin antiseptic. A timeout was performed prior to the initiation of the procedure. Select axial  scans through the thorax were obtained. An appropriate skin entry site was determined and marked. Site was prepped and draped in usual sterile fashion. Maximal barrier sterile technique was utilized including mask, sterile gloves, sterile drape, hand hygiene and skin antiseptic. Under CT fluoroscopic guidance, a 19 gauge percutaneous entry needle was advanced into the right pleural space. Gas could be aspirated. An Amplatz guidewire advanced easily, position confirmed on CT. Tract dilated to facilitate placement of a 16 French Thal drain catheter, directed towards the apex anteriorly. CT confirmed appropriate catheter position. Catheter secured externally with 0 Prolene suture and placed to a Pleur-Evac. The patient tolerated the procedure well. IMPRESSION: 1. Technically successful 16 French right chest tube placement for hydropneumothorax. Electronically Signed   By: Corlis Leak M.D.   On: 08/07/2016 17:42    Labs:  CBC:  Recent Labs  08/07/16 0404 08/08/16 0231 08/09/16 0422 08/10/16 0342  WBC 10.3 11.3* 9.1 10.0  HGB 9.9* 10.5* 10.1* 10.4*  HCT 31.7* 33.2* 32.3* 32.6*  PLT 242 225 245 242    COAGS:  Recent Labs  08/07/16 0404 08/07/16 1211 08/08/16 0231 08/08/16 0925 08/09/16 0422  INR  --  1.33  --   --   --   APTT 59*  --  64* 79* 79*    BMP:  Recent Labs  08/06/16 0639 08/07/16 0404 08/08/16 0231 08/09/16 0422  NA 137 137 137 136  K 3.7 3.5 3.9 3.6  CL 96* 93* 91* 94*  CO2 34* 36* 34* 35*  GLUCOSE 168* 128* 214* 149*  BUN 14 20 22* 23*  CALCIUM 8.6* 8.7* 9.0 9.2  CREATININE 0.88 1.25* 1.21 1.21  GFRNONAA >60 50* 52* 52*  GFRAA >60 57* 60* 60*    LIVER FUNCTION TESTS:  Recent Labs  04/05/16 1102 06/12/16 2316 08/04/16 0708 08/05/16 1023  BILITOT 0.7 0.8 0.9 1.1  AST 13 16 16  14*  ALT 10 13* 16* 13*  ALKPHOS 71 63 60 60  PROT 7.3 6.7 6.2* 6.1*  ALBUMIN 4.0 3.7 3.1* 3.1*    Assessment and Plan:  Pneumothorax after thoracentesis  CXR shows PTX  slightly larger (Reviewed by Dr. Miles Costain)  CT now back to suction at 20 mmHg  Repeat CXR in am   Electronically Signed: [facility] S Bryahna Lesko PA-C 08/10/2016, 12:12 PM   I spent a total of 15 Minutes at the the patient's bedside AND on the patient's hospital floor or unit, greater than 50% of which was counseling/coordinating care for f/u chest tube

## 2016-08-10 NOTE — Progress Notes (Signed)
PROGRESS NOTE        PATIENT DETAILS Name: Kyle [name] Age: 83 y.o. Sex: male Date of Birth: 02/29/1926 Admit Date: 08/04/2016 Admitting Physician Haydee SalterPhillip M Hobbs, MD ZOX:[id number],XBJYNPCP:[id number],PETER [name], MD  Brief Narrative: Patient is a 83 y.o. male with history of atrial fibrillation, trifascicular block and pacemaker placement, HTN, hyperlipidemia, diabetes, COPD, OSA, diastolic heart failure, and pulmonary HTN. He was admitted for acute respiratory failure with hypoxia due to acute diastolic CHF and bilateral transudative pleural effusion. He was started on Lasix and underwent thoracocentesis on 2/19, unfortunately he developed a small Pneumothorax post thoracocentesis requiring chest tube placement. See below for further details.   Subjective: No major issues overnight-denies any SOB  Assessment/Plan: Acute respiratory failure with hypoxia due to bilateral pleural effusion and acute diastolic heart failure: Better, appears more compensated, weight decreased to 160 pounds (171 pounds on admission) and > -12 L since admission. Continue Lasix.  Right pleural effusion: Likely secondary to heart failure-transudative by light's criteria. Continue diuresis-underwent thoracocentesis on 2/19.   Right-sided pneumothorax: Iatrogenic-likely due to recent thoracocentesis. Chest tube placed on 2/21, and was transitioned to water seal on 2/23-CXR on 2/24 shows recurrence of PTX-await recommendations from IR. Note chest tube management is per IR.   Possible pneumonia: Likely has atelectasis due to effusion, no fever, had mild leukocytosis, appears nontoxic-Levaquin stopped 2/22.  Type 2 Diabetes Mellitus: CBGs minimally elevated- Continue sliding scale insulin. Plan on resuming oral medications on discharge  Dyslipidemia: Continue simvastatin.   Hypertension: Controlled- Continue metoprolol and lisinopril.   COPD: Stable without any signs of exacerbation-continue albuterol  neb.   Chronic Atrial fibrillation: Eliquis switched to heparin-continue for now till chest tube removed. Continue metoprolol.[place] vasc 2 score of at least 4   OSA. BiPAP daily at bedtime.   DVT Prophylaxis: Heparin   Code Status: DNR  Family Communication: None at bedside this am-Note spoke with the daughter at bedside on 2/22  Disposition Plan: Remain inpatient- SNF on discharge-once Chest tube has been removed.  Antimicrobial agents: Anti-infectives    Start     Dose/Rate Route Frequency Ordered Stop   08/08/16 1400  levofloxacin (LEVAQUIN) tablet 750 mg  Status:  Discontinued     750 mg Oral Every 48 hours 08/07/16 1102 08/08/16 1140   08/06/16 1400  levofloxacin (LEVAQUIN) tablet 750 mg  Status:  Discontinued     750 mg Oral Every 24 hours 08/06/16 1253 08/07/16 1102   08/05/16 0230  vancomycin (VANCOCIN) IVPB 1000 mg/200 mL premix  Status:  Discontinued     1,000 mg 200 mL/hr over 60 Minutes Intravenous Every 12 hours 08/04/16 1416 08/06/16 1205   08/04/16 1430  ceFEPIme (MAXIPIME) 1 g in dextrose 5 % 50 mL IVPB  Status:  Discontinued     1 g 100 mL/hr over 30 Minutes Intravenous Every 8 hours 08/04/16 1407 08/06/16 1205   08/04/16 1430  vancomycin (VANCOCIN) 1,750 mg in sodium chloride 0.9 % 500 mL IVPB     1,750 mg 250 mL/hr over 120 Minutes Intravenous  Once 08/04/16 1416 08/04/16 1751   08/04/16 0830  vancomycin (VANCOCIN) 1,250 mg in sodium chloride 0.9 % 250 mL IVPB     1,250 mg 166.7 mL/hr over 90 Minutes Intravenous  Once 08/04/16 0758 08/04/16 1020   08/04/16 0800  piperacillin-tazobactam (ZOSYN) IVPB 3.375 g  Status:  Discontinued  3.375 g 12.5 mL/hr over 240 Minutes Intravenous Every 8 hours 08/04/16 0741 08/04/16 0745   08/04/16 0800  piperacillin-tazobactam (ZOSYN) IVPB 3.375 g     3.375 g 100 mL/hr over 30 Minutes Intravenous  Once 08/04/16 0758 08/04/16 0850   08/04/16 0745  vancomycin (VANCOCIN) 1,250 mg in sodium chloride 0.9 % 250 mL IVPB  Status:   Discontinued     1,250 mg 166.7 mL/hr over 90 Minutes Intravenous  Once 08/04/16 0741 08/04/16 0745      Procedures: 2/19>>Thoracocentesis 2/21>>Chest tube placement  CONSULTS:  Radiology  Time spent: 25 minutes-Greater than 50% of this time was spent in counseling, explanation of diagnosis, planning of further management, and coordination of care.  MEDICATIONS: Scheduled Meds: . albuterol  2.5 mg Nebulization TID  . doxazosin  4 mg Oral Daily  . escitalopram  10 mg Oral Daily  . furosemide  40 mg Oral BID  . insulin aspart  0-5 Units Subcutaneous QHS  . insulin aspart  0-9 Units Subcutaneous TID WC  . latanoprost  1 drop Both Eyes QHS  . lisinopril  20 mg Oral Daily  . mouth rinse  15 mL Mouth Rinse BID  . metoprolol succinate  25 mg Oral Daily  . nitroGLYCERIN  0.5 inch Topical Q6H  . polyethylene glycol  17 g Oral Daily  . simvastatin  20 mg Oral QHS   Continuous Infusions: . heparin 1,250 Units/hr (08/09/16 2138)   PRN Meds:.acetaminophen, albuterol, bisacodyl, ondansetron (ZOFRAN) IV, senna-docusate   PHYSICAL EXAM: Vital signs: Vitals:   08/09/16 2127 08/10/16 0100 08/10/16 0500 08/10/16 0846  BP:  (!) 124/59 (!) 165/73   Pulse:  72 69   Resp:  18 18   Temp:  97.7 F (36.5 C) 97.9 F (36.6 C)   TempSrc:  Oral Oral   SpO2: 97% 94% 94% 91%  Weight:   72.6 kg (160 lb)   Height:       Filed Weights   08/08/16 0409 08/09/16 0419 08/10/16 0500  Weight: 70.7 kg (155 lb 13.8 oz) 74 kg (163 lb 1.6 oz) 72.6 kg (160 lb)   Body mass index is 22.63 kg/m.   General appearance :Awake, alert, not in any distress. Speech Clear. Chronically sick appearing. Eyes:, pupils equally reactive to light and accomodation,no scleral icterus.Pink conjunctiva HEENT: Atraumatic and Normocephalic Neck: supple, no JVD. No cervical lymphadenopathy. No thyromegaly Resp:Good air entry -anteriorly CVS: S1 S2 regular, no murmurs.  GI: Bowel sounds present, Non tender and not  distended with no gaurding, rigidity or rebound.No organomegaly Extremities: B/L Lower Ext shows no edema, both legs are warm to touch Neurology:  speech clear,Non focal, sensation is grossly intact. Psychiatric: Normal judgment and insight. Alert and oriented x 3. Normal mood. Musculoskeletal:No digital cyanosis Skin:No Rash, warm and dry Wounds:N/A  I have personally reviewed following labs and imaging studies  LABORATORY DATA: CBC:  Recent Labs Lab 08/04/16 0708 08/05/16 0449 08/06/16 0511 08/07/16 0404 08/08/16 0231 08/09/16 0422 08/10/16 0342  WBC 12.4* 9.8 9.2 10.3 11.3* 9.1 10.0  NEUTROABS 9.6* 6.5  --   --   --   --   --   HGB 9.6* 9.5* 9.5* 9.9* 10.5* 10.1* 10.4*  HCT 31.1* 30.5* 30.1* 31.7* 33.2* 32.3* 32.6*  MCV 88.9 88.4 88.5 87.3 87.6 87.1 86.5  PLT 239 240 237 242 225 245 242    Basic Metabolic Panel:  Recent Labs Lab 08/05/16 0449 08/06/16 0639 08/07/16 0404 08/08/16 0231 08/09/16 0422  NA 140  137 137 137 136  K 3.2* 3.7 3.5 3.9 3.6  CL 99* 96* 93* 91* 94*  CO2 33* 34* 36* 34* 35*  GLUCOSE 140* 168* 128* 214* 149*  BUN 10 14 20  22* 23*  CREATININE 0.74 0.88 1.25* 1.21 1.21  CALCIUM 9.0 8.6* 8.7* 9.0 9.2    GFR: Estimated Creatinine Clearance: 43.3 mL/min (by C-G formula based on SCr of 1.21 mg/dL).  Liver Function Tests:  Recent Labs Lab 08/04/16 0708 08/05/16 1023  AST 16 14*  ALT 16* 13*  ALKPHOS 60 60  BILITOT 0.9 1.1  PROT 6.2* 6.1*  ALBUMIN 3.1* 3.1*   No results for input(s): LIPASE, AMYLASE in the last 168 hours. No results for input(s): AMMONIA in the last 168 hours.  Coagulation Profile:  Recent Labs Lab 08/07/16 1211  INR 1.33    Cardiac Enzymes: No results for input(s): CKTOTAL, CKMB, CKMBINDEX, TROPONINI in the last 168 hours.  BNP (last 3 results)  Recent Labs  04/05/16 1102  PROBNP 367.0*    HbA1C: No results for input(s): HGBA1C in the last 72 hours.  CBG:  Recent Labs Lab 08/09/16 0822  08/09/16 1120 08/09/16 1742 08/09/16 2121 08/10/16 0804  GLUCAP 144* 174* 137* 180* 158*    Lipid Profile: No results for input(s): CHOL, HDL, LDLCALC, TRIG, CHOLHDL, LDLDIRECT in the last 72 hours.  Thyroid Function Tests: No results for input(s): TSH, T4TOTAL, FREET4, T3FREE, THYROIDAB in the last 72 hours.  Anemia Panel: No results for input(s): VITAMINB12, FOLATE, FERRITIN, TIBC, IRON, RETICCTPCT in the last 72 hours.  Urine analysis:    Component Value Date/Time   COLORURINE AMBER (A) 06/12/2016 1748   APPEARANCEUR HAZY (A) 06/12/2016 1748   LABSPEC 1.027 06/12/2016 1748   PHURINE 5.0 06/12/2016 1748   GLUCOSEU NEGATIVE 06/12/2016 1748   HGBUR NEGATIVE 06/12/2016 1748   HGBUR negative 04/05/2009 0856   BILIRUBINUR NEGATIVE 06/12/2016 1748   BILIRUBINUR n 09/20/2015 0840   KETONESUR NEGATIVE 06/12/2016 1748   PROTEINUR 100 (A) 06/12/2016 1748   UROBILINOGEN 0.2 09/20/2015 0840   UROBILINOGEN 0.2 04/05/2009 0856   NITRITE NEGATIVE 06/12/2016 1748   LEUKOCYTESUR NEGATIVE 06/12/2016 1748    Sepsis Labs: Lactic Acid, Venous    Component Value Date/Time   LATICACIDVEN 1.23 08/04/2016 1107    MICROBIOLOGY: Recent Results (from the past 240 hour(s))  Blood culture (routine x 2)     Status: None   Collection Time: 08/04/16  7:50 AM  Result Value Ref Range Status   Specimen Description BLOOD RIGHT HAND  Final   Special Requests BOTTLES DRAWN AEROBIC AND ANAEROBIC  5CC  Final   Culture NO GROWTH 5 DAYS  Final   Report Status 08/09/2016 FINAL  Final  Blood culture (routine x 2)     Status: None   Collection Time: 08/04/16  8:01 AM  Result Value Ref Range Status   Specimen Description BLOOD RIGHT ANTECUBITAL  Final   Special Requests BOTTLES DRAWN AEROBIC AND ANAEROBIC  5CC  Final   Culture NO GROWTH 5 DAYS  Final   Report Status 08/09/2016 FINAL  Final  Respiratory Panel by PCR     Status: None   Collection Time: 08/04/16  9:56 AM  Result Value Ref Range Status     Adenovirus NOT DETECTED NOT DETECTED Final   Coronavirus 229E NOT DETECTED NOT DETECTED Final   Coronavirus HKU1 NOT DETECTED NOT DETECTED Final   Coronavirus NL63 NOT DETECTED NOT DETECTED Final   Coronavirus OC43 NOT DETECTED NOT  DETECTED Final   Metapneumovirus NOT DETECTED NOT DETECTED Final   Rhinovirus / Enterovirus NOT DETECTED NOT DETECTED Final   Influenza A NOT DETECTED NOT DETECTED Final   Influenza B NOT DETECTED NOT DETECTED Final   Parainfluenza Virus 1 NOT DETECTED NOT DETECTED Final   Parainfluenza Virus 2 NOT DETECTED NOT DETECTED Final   Parainfluenza Virus 3 NOT DETECTED NOT DETECTED Final   Parainfluenza Virus 4 NOT DETECTED NOT DETECTED Final   Respiratory Syncytial Virus NOT DETECTED NOT DETECTED Final   Bordetella pertussis NOT DETECTED NOT DETECTED Final   Chlamydophila pneumoniae NOT DETECTED NOT DETECTED Final   Mycoplasma pneumoniae NOT DETECTED NOT DETECTED Final  MRSA PCR Screening     Status: None   Collection Time: 08/04/16  2:44 PM  Result Value Ref Range Status   MRSA by PCR NEGATIVE NEGATIVE Final    Comment:        The GeneXpert MRSA Assay (FDA approved for NASAL specimens only), is one component of a comprehensive MRSA colonization surveillance program. It is not intended to diagnose MRSA infection nor to guide or monitor treatment for MRSA infections.   Culture, body fluid-bottle     Status: None (Preliminary result)   Collection Time: 08/05/16  1:48 PM  Result Value Ref Range Status   Specimen Description FLUID PLEURAL RIGHT  Final   Special Requests NONE  Final   Culture NO GROWTH 4 DAYS  Final   Report Status PENDING  Incomplete  Gram stain     Status: None   Collection Time: 08/05/16  1:48 PM  Result Value Ref Range Status   Specimen Description FLUID  Final   Special Requests NONE  Final   Gram Stain   Final    WBC PRESENT, PREDOMINANTLY PMN NO ORGANISMS SEEN CYTOSPIN SMEAR    Report Status 08/05/2016 FINAL  Final     RADIOLOGY STUDIES/RESULTS:    LOS: 6 days   Nikkia Devoss,  If 7PM-7AM, please contact night-coverage [email or web address] Password [email or web address] 08/10/2016, 10:06 AM

## 2016-08-10 NOTE — Plan of Care (Signed)
Problem: Bowel/Gastric: Goal: Will not experience complications related to bowel motility Outcome: Not Progressing Covering MD notified pt reports no BM since 2/18

## 2016-08-11 ENCOUNTER — Inpatient Hospital Stay (HOSPITAL_COMMUNITY): Payer: Medicare Other

## 2016-08-11 DIAGNOSIS — S270XXA Traumatic pneumothorax, initial encounter: Secondary | ICD-10-CM

## 2016-08-11 LAB — BASIC METABOLIC PANEL
ANION GAP: 11 (ref 5–15)
BUN: 27 mg/dL — ABNORMAL HIGH (ref 6–20)
CALCIUM: 9.6 mg/dL (ref 8.9–10.3)
CO2: 34 mmol/L — AB (ref 22–32)
Chloride: 94 mmol/L — ABNORMAL LOW (ref 101–111)
Creatinine, Ser: 1.23 mg/dL (ref 0.61–1.24)
GFR, EST AFRICAN AMERICAN: 59 mL/min — AB (ref >60)
GFR, EST NON AFRICAN AMERICAN: 51 mL/min — AB (ref >60)
Glucose, Bld: 145 mg/dL — ABNORMAL HIGH (ref 65–99)
Potassium: 3.6 mmol/L (ref 3.5–5.1)
Sodium: 139 mmol/L (ref 135–145)

## 2016-08-11 LAB — CBC
HEMATOCRIT: 33.7 % — AB (ref 39.0–52.0)
Hemoglobin: 10.6 g/dL — ABNORMAL LOW (ref 13.0–17.0)
MCH: 27.3 pg (ref 26.0–34.0)
MCHC: 31.5 g/dL (ref 30.0–36.0)
MCV: 86.9 fL (ref 78.0–100.0)
PLATELETS: 254 10*3/uL (ref 150–400)
RBC: 3.88 MIL/uL — ABNORMAL LOW (ref 4.22–5.81)
RDW: 15.5 % (ref 11.5–15.5)
WBC: 10.6 10*3/uL — AB (ref 4.0–10.5)

## 2016-08-11 LAB — GLUCOSE, CAPILLARY
GLUCOSE-CAPILLARY: 170 mg/dL — AB (ref 65–99)
GLUCOSE-CAPILLARY: 178 mg/dL — AB (ref 65–99)
Glucose-Capillary: 189 mg/dL — ABNORMAL HIGH (ref 65–99)

## 2016-08-11 LAB — HEPARIN LEVEL (UNFRACTIONATED): HEPARIN UNFRACTIONATED: 0.53 [IU]/mL (ref 0.30–0.70)

## 2016-08-11 NOTE — Progress Notes (Signed)
PROGRESS NOTE        PATIENT DETAILS Name: Kyle CairoRobert P Laws Age: 83 y.o. Sex: male Date of Birth: 30-Nov-1925 Admit Date: 08/04/2016 Admitting Physician Haydee SalterPhillip M Hobbs, MD ZOX:[id number],XBJYNPCP:KWIATKOWSKI,PETER [name], MD  Brief Narrative: Patient is a 83 y.o. male with history of atrial fibrillation, trifascicular block and pacemaker placement, HTN, hyperlipidemia, diabetes, COPD, OSA, diastolic heart failure, and pulmonary HTN. He was admitted for acute respiratory failure with hypoxia due to acute diastolic CHF and bilateral transudative pleural effusion. He was started on Lasix and underwent thoracocentesis on 2/19, unfortunately he developed a small Pneumothorax post thoracocentesis requiring chest tube placement. See below for further details.   Subjective: Lying comfortably in bed-"still here doc". Denies shortness of breath  Assessment/Plan: Acute respiratory failure with hypoxia due to bilateral pleural effusion and acute diastolic heart failure: Better, appears more compensated, weight decreased to 158 pounds (171 pounds on admission) and > -13 L since admission. Continue Lasix.  Right pleural effusion: Likely secondary to heart failure-transudative by light's criteria. Continue diuresis-underwent thoracocentesis on 2/19.   Right-sided pneumothorax: Iatrogenic-likely due to recent thoracocentesis. Chest tube placed on 2/21, and was transitioned to water seal, however on 2/23-CXR on 2/24 showed recurrence of PTX-and chest tube was placed back on suction. IR managing chest 2.   Possible pneumonia: Likely has atelectasis due to effusion, no fever, had mild leukocytosis, appears nontoxic-Levaquin stopped 2/22 (completed 5 days of antimicrobial therapy).  Type 2 Diabetes Mellitus: CBGs minimally elevated- Continue sliding scale insulin. Plan on resuming oral medications on discharge  Dyslipidemia: Continue simvastatin.   Hypertension: Controlled- Continue metoprolol and  lisinopril.   COPD: Stable without any signs of exacerbation-continue albuterol neb.   Chronic Atrial fibrillation: Eliquis switched to heparin-continue for now till chest tube removed. Continue metoprolol.[place] vasc 2 score of at least 4   OSA. BiPAP daily at bedtime.   DVT Prophylaxis: Heparin   Code Status: DNR  Family Communication: None at bedside this am-Note spoke with the daughter at bedside on 2/22  Disposition Plan: Remain inpatient- SNF on discharge-once Chest tube has been removed.  Antimicrobial agents: Anti-infectives    Start     Dose/Rate Route Frequency Ordered Stop   08/08/16 1400  levofloxacin (LEVAQUIN) tablet 750 mg  Status:  Discontinued     750 mg Oral Every 48 hours 08/07/16 1102 08/08/16 1140   08/06/16 1400  levofloxacin (LEVAQUIN) tablet 750 mg  Status:  Discontinued     750 mg Oral Every 24 hours 08/06/16 1253 08/07/16 1102   08/05/16 0230  vancomycin (VANCOCIN) IVPB 1000 mg/200 mL premix  Status:  Discontinued     1,000 mg 200 mL/hr over 60 Minutes Intravenous Every 12 hours 08/04/16 1416 08/06/16 1205   08/04/16 1430  ceFEPIme (MAXIPIME) 1 g in dextrose 5 % 50 mL IVPB  Status:  Discontinued     1 g 100 mL/hr over 30 Minutes Intravenous Every 8 hours 08/04/16 1407 08/06/16 1205   08/04/16 1430  vancomycin (VANCOCIN) 1,750 mg in sodium chloride 0.9 % 500 mL IVPB     1,750 mg 250 mL/hr over 120 Minutes Intravenous  Once 08/04/16 1416 08/04/16 1751   08/04/16 0830  vancomycin (VANCOCIN) 1,250 mg in sodium chloride 0.9 % 250 mL IVPB     1,250 mg 166.7 mL/hr over 90 Minutes Intravenous  Once 08/04/16 0758 08/04/16 1020  08/04/16 0800  piperacillin-tazobactam (ZOSYN) IVPB 3.375 g  Status:  Discontinued     3.375 g 12.5 mL/hr over 240 Minutes Intravenous Every 8 hours 08/04/16 0741 08/04/16 0745   08/04/16 0800  piperacillin-tazobactam (ZOSYN) IVPB 3.375 g     3.375 g 100 mL/hr over 30 Minutes Intravenous  Once 08/04/16 0758 08/04/16 0850   08/04/16  0745  vancomycin (VANCOCIN) 1,250 mg in sodium chloride 0.9 % 250 mL IVPB  Status:  Discontinued     1,250 mg 166.7 mL/hr over 90 Minutes Intravenous  Once 08/04/16 0741 08/04/16 0745      Procedures: 2/19>>Thoracocentesis 2/21>>Chest tube placement  CONSULTS:  Radiology  Time spent: 25 minutes-Greater than 50% of this time was spent in counseling, explanation of diagnosis, planning of further management, and coordination of care.  MEDICATIONS: Scheduled Meds: . albuterol  2.5 mg Nebulization TID  . doxazosin  4 mg Oral Daily  . escitalopram  10 mg Oral Daily  . furosemide  40 mg Oral BID  . insulin aspart  0-5 Units Subcutaneous QHS  . insulin aspart  0-9 Units Subcutaneous TID WC  . latanoprost  1 drop Both Eyes QHS  . lisinopril  20 mg Oral Daily  . mouth rinse  15 mL Mouth Rinse BID  . metoprolol succinate  25 mg Oral Daily  . nitroGLYCERIN  0.5 inch Topical Q6H  . polyethylene glycol  17 g Oral Daily  . simvastatin  20 mg Oral QHS   Continuous Infusions: . heparin 1,250 Units/hr (08/10/16 1900)   PRN Meds:.acetaminophen, albuterol, bisacodyl, ondansetron (ZOFRAN) IV, senna-docusate   PHYSICAL EXAM: Vital signs: Vitals:   08/11/16 0000 08/11/16 0400 08/11/16 0800 08/11/16 0840  BP:  (!) 163/83    Pulse: 70 71    Resp: (!) 24 (!) 24    Temp:  98 F (36.7 C)    TempSrc:  Oral    SpO2: 93% 97%  96%  Weight:   72.1 kg (158 lb 14.4 oz)   Height:       Filed Weights   08/09/16 0419 08/10/16 0500 08/11/16 0800  Weight: 74 kg (163 lb 1.6 oz) 72.6 kg (160 lb) 72.1 kg (158 lb 14.4 oz)   Body mass index is 22.48 kg/m.   General appearance :Awake, alert, not in any distress. Speech Clear. Chronically sick appearing. Eyes:, pupils equally reactive to light and accomodation,no scleral icterus.Pink conjunctiva HEENT: Atraumatic and Normocephalic Neck: supple, no JVD. No cervical lymphadenopathy. No thyromegaly Resp:Good air entry -anteriorly CVS: S1 S2 regular, no  murmurs.  GI: Bowel sounds present, Non tender and not distended with no gaurding, rigidity or rebound.No organomegaly Extremities: B/L Lower Ext shows no edema, both legs are warm to touch Neurology:  speech clear,Non focal, sensation is grossly intact. Psychiatric: Normal judgment and insight. Alert and oriented x 3. Normal mood. Musculoskeletal:No digital cyanosis Skin:No Rash, warm and dry Wounds:N/A  I have personally reviewed following labs and imaging studies  LABORATORY DATA: CBC:  Recent Labs Lab 08/05/16 0449  08/07/16 0404 08/08/16 0231 08/09/16 0422 08/10/16 0342 08/11/16 0509  WBC 9.8  < > 10.3 11.3* 9.1 10.0 10.6*  NEUTROABS 6.5  --   --   --   --   --   --   HGB 9.5*  < > 9.9* 10.5* 10.1* 10.4* 10.6*  HCT 30.5*  < > 31.7* 33.2* 32.3* 32.6* 33.7*  MCV 88.4  < > 87.3 87.6 87.1 86.5 86.9  PLT 240  < > 242  225 245 242 254  < > = values in this interval not displayed.  Basic Metabolic Panel:  Recent Labs Lab 08/06/16 0639 08/07/16 0404 08/08/16 0231 08/09/16 0422 08/11/16 0509  NA 137 137 137 136 139  K 3.7 3.5 3.9 3.6 3.6  CL 96* 93* 91* 94* 94*  CO2 34* 36* 34* 35* 34*  GLUCOSE 168* 128* 214* 149* 145*  BUN 14 20 22* 23* 27*  CREATININE 0.88 1.25* 1.21 1.21 1.23  CALCIUM 8.6* 8.7* 9.0 9.2 9.6    GFR: Estimated Creatinine Clearance: 42.3 mL/min (by C-G formula based on SCr of 1.23 mg/dL).  Liver Function Tests:  Recent Labs Lab 08/05/16 1023  AST 14*  ALT 13*  ALKPHOS 60  BILITOT 1.1  PROT 6.1*  ALBUMIN 3.1*   No results for input(s): LIPASE, AMYLASE in the last 168 hours. No results for input(s): AMMONIA in the last 168 hours.  Coagulation Profile:  Recent Labs Lab 08/07/16 1211  INR 1.33    Cardiac Enzymes: No results for input(s): CKTOTAL, CKMB, CKMBINDEX, TROPONINI in the last 168 hours.  BNP (last 3 results)  Recent Labs  04/05/16 1102  PROBNP 367.0*    HbA1C: No results for input(s): HGBA1C in the last 72  hours.  CBG:  Recent Labs Lab 08/10/16 0804 08/10/16 1140 08/10/16 1619 08/10/16 2145 08/10/16 2159  GLUCAP 158* 169* 119* 152* 148*    Lipid Profile: No results for input(s): CHOL, HDL, LDLCALC, TRIG, CHOLHDL, LDLDIRECT in the last 72 hours.  Thyroid Function Tests: No results for input(s): TSH, T4TOTAL, FREET4, T3FREE, THYROIDAB in the last 72 hours.  Anemia Panel: No results for input(s): VITAMINB12, FOLATE, FERRITIN, TIBC, IRON, RETICCTPCT in the last 72 hours.  Urine analysis:    Component Value Date/Time   COLORURINE AMBER (A) 06/12/2016 1748   APPEARANCEUR HAZY (A) 06/12/2016 1748   LABSPEC 1.027 06/12/2016 1748   PHURINE 5.0 06/12/2016 1748   GLUCOSEU NEGATIVE 06/12/2016 1748   HGBUR NEGATIVE 06/12/2016 1748   HGBUR negative 04/05/2009 0856   BILIRUBINUR NEGATIVE 06/12/2016 1748   BILIRUBINUR n 09/20/2015 0840   KETONESUR NEGATIVE 06/12/2016 1748   PROTEINUR 100 (A) 06/12/2016 1748   UROBILINOGEN 0.2 09/20/2015 0840   UROBILINOGEN 0.2 04/05/2009 0856   NITRITE NEGATIVE 06/12/2016 1748   LEUKOCYTESUR NEGATIVE 06/12/2016 1748    Sepsis Labs: Lactic Acid, Venous    Component Value Date/Time   LATICACIDVEN 1.23 08/04/2016 1107    MICROBIOLOGY: Recent Results (from the past 240 hour(s))  Blood culture (routine x 2)     Status: None   Collection Time: 08/04/16  7:50 AM  Result Value Ref Range Status   Specimen Description BLOOD RIGHT HAND  Final   Special Requests BOTTLES DRAWN AEROBIC AND ANAEROBIC  5CC  Final   Culture NO GROWTH 5 DAYS  Final   Report Status 08/09/2016 FINAL  Final  Blood culture (routine x 2)     Status: None   Collection Time: 08/04/16  8:01 AM  Result Value Ref Range Status   Specimen Description BLOOD RIGHT ANTECUBITAL  Final   Special Requests BOTTLES DRAWN AEROBIC AND ANAEROBIC  5CC  Final   Culture NO GROWTH 5 DAYS  Final   Report Status 08/09/2016 FINAL  Final  Respiratory Panel by PCR     Status: None   Collection Time:  08/04/16  9:56 AM  Result Value Ref Range Status   Adenovirus NOT DETECTED NOT DETECTED Final   Coronavirus 229E NOT DETECTED NOT DETECTED  Final   Coronavirus HKU1 NOT DETECTED NOT DETECTED Final   Coronavirus NL63 NOT DETECTED NOT DETECTED Final   Coronavirus OC43 NOT DETECTED NOT DETECTED Final   Metapneumovirus NOT DETECTED NOT DETECTED Final   Rhinovirus / Enterovirus NOT DETECTED NOT DETECTED Final   Influenza A NOT DETECTED NOT DETECTED Final   Influenza B NOT DETECTED NOT DETECTED Final   Parainfluenza Virus 1 NOT DETECTED NOT DETECTED Final   Parainfluenza Virus 2 NOT DETECTED NOT DETECTED Final   Parainfluenza Virus 3 NOT DETECTED NOT DETECTED Final   Parainfluenza Virus 4 NOT DETECTED NOT DETECTED Final   Respiratory Syncytial Virus NOT DETECTED NOT DETECTED Final   Bordetella pertussis NOT DETECTED NOT DETECTED Final   Chlamydophila pneumoniae NOT DETECTED NOT DETECTED Final   Mycoplasma pneumoniae NOT DETECTED NOT DETECTED Final  MRSA PCR Screening     Status: None   Collection Time: 08/04/16  2:44 PM  Result Value Ref Range Status   MRSA by PCR NEGATIVE NEGATIVE Final    Comment:        The GeneXpert MRSA Assay (FDA approved for NASAL specimens only), is one component of a comprehensive MRSA colonization surveillance program. It is not intended to diagnose MRSA infection nor to guide or monitor treatment for MRSA infections.   Culture, body fluid-bottle     Status: None   Collection Time: 08/05/16  1:48 PM  Result Value Ref Range Status   Specimen Description FLUID PLEURAL RIGHT  Final   Special Requests NONE  Final   Culture NO GROWTH 5 DAYS  Final   Report Status 08/10/2016 FINAL  Final  Gram stain     Status: None   Collection Time: 08/05/16  1:48 PM  Result Value Ref Range Status   Specimen Description FLUID  Final   Special Requests NONE  Final   Gram Stain   Final    WBC PRESENT, PREDOMINANTLY PMN NO ORGANISMS SEEN CYTOSPIN SMEAR    Report Status  08/05/2016 FINAL  Final    RADIOLOGY STUDIES/RESULTS:    LOS: 7 days   GHIMIRE,SHANKER,  If 7PM-7AM, please contact night-coverage [email or web address] Password [email or web address] 08/11/2016, 10:45 AM

## 2016-08-11 NOTE — Progress Notes (Signed)
ANTICOAGULATION CONSULT NOTE - Follow Up Consult  Pharmacy Consult:  Heparin Indication: atrial fibrillation  No Known Allergies  Patient Measurements: Height: 5' 10.5" (179.1 cm) Weight: 158 lb 14.4 oz (72.1 kg) IBW/kg (Calculated) : 74.15 Heparin Dosing Weight: 71 kg  Vital Signs: Temp: 98 F (36.7 C) (02/25 0400) Temp Source: Oral (02/25 0400) BP: 163/83 (02/25 0400) Pulse Rate: 71 (02/25 0400)  Labs:  Recent Labs  08/09/16 0422 08/10/16 0342 08/11/16 0509  HGB 10.1* 10.4* 10.6*  HCT 32.3* 32.6* 33.7*  PLT 245 242 254  APTT 79*  --   --   HEPARINUNFRC 0.67 0.60 0.53  CREATININE 1.21  --  1.23    Estimated Creatinine Clearance: 42.3 mL/min (by C-G formula based on SCr of 1.23 mg/dL).    Assessment: 89 YOM on Eliquis PTA for history of AFib, which was placed on hold for thoracentesis on 08/05/16 and then resumed that evening.  He was switched to IV heparin on 08/06/16.  Now s/p chest tube placement and restarted heparin.  Heparin level therapeutic at 0.53 on 1250 units/hr. CBC stable. No s/sx bleeding noted.   Goal of Therapy:  Heparin level 0.3-0.7 units/ml Monitor platelets by anticoagulation protocol: Yes    Plan:  - Continue IV heparin 1250 units/hr - Monitor closely for s/sx bleeding -Heparin levels daily -Follow up transition to PO AC  Allena Katzaroline E Teegan Brandis, Pharm.D. PGY1 Pharmacy Resident 2/25/201810:45 AM Pager [phone]

## 2016-08-11 NOTE — Progress Notes (Signed)
Referring Physician(s): S. Ghimire  Supervising Physician: Ruel FavorsShick, Kyle  Patient [facility]:  [facility]  Chief Complaint:  [name]  Subjective:  Pt without complaints.  Allergies: Patient has no known allergies.  Medications: Prior to Admission medications   Medication Sig Start Date End Date Taking? Authorizing Provider  albuterol (PROVENTIL,VENTOLIN) 90 MCG/ACT inhaler Inhale 2 puffs into the lungs 2 (two) times daily.     Yes Historical Provider, MD  apixaban (ELIQUIS) 2.5 MG TABS tablet Take 1 tablet (2.5 mg total) by mouth 2 (two) times daily. 11/23/15  Yes Newman Niponna C Carroll, NP  doxazosin (CARDURA) 4 MG tablet TAKE 1 TABLET EACH DAY. 03/07/16  Yes Gordy SaversPeter F Kwiatkowski, MD  escitalopram (LEXAPRO) 10 MG tablet Take 10 mg by mouth daily.   Yes Historical Provider, MD  Fluticasone Propionate, Inhal, (FLOVENT DISKUS) 100 MCG/BLIST AEPB Inhale 1 puff into the lungs 2 (two) times daily.   Yes Historical Provider, MD  latanoprost (XALATAN) 0.005 % ophthalmic solution 1 drop at bedtime.   Yes Historical Provider, MD  lisinopril (PRINIVIL,ZESTRIL) 40 MG tablet TAKE 1 TABLET ONCE DAILY. 08/17/15  Yes Gordy SaversPeter F Kwiatkowski, MD  metFORMIN (GLUCOPHAGE-XR) 500 MG 24 hr tablet Take 1,500 mg by mouth daily with breakfast.   Yes Historical Provider, MD  metoprolol succinate (TOPROL-XL) 50 MG 24 hr tablet Take 1/2 tablet by mouth daily (25mg ) 11/02/15  Yes Newman Niponna C Carroll, NP  doxycycline (VIBRAMYCIN) 100 MG capsule Take 1 capsule (100 mg total) by mouth 2 (two) times daily. Patient not taking: Reported on 08/04/2016 06/13/16   Maia PlanJoshua G Long, MD  furosemide (LASIX) 40 MG tablet 1 half tablet daily as needed for fluid control 05/14/16   Gordy SaversPeter F Kwiatkowski, MD  glucose blood (PRECISION XTRA TEST STRIPS) test strip 1 each by Other route daily as needed for other. Use as instructed 06/24/13   Gordy SaversPeter F Kwiatkowski, MD     Vital Signs: BP (!) 163/83 (BP Location: Left Arm)   Pulse 71    Temp 98 F (36.7 C) (Oral)   Resp (!) 24   Ht 5' 10.5" (1.791 m)   Wt 158 lb 14.4 oz (72.1 kg)   SpO2 96%   BMI 22.48 kg/m   Physical Exam Awake, lying in bed Chest tube in place - no air leak ~ 240 mL output overnight. PTX resolved on CXR Reviewed by Dr. Miles CostainShick Chest tube to remain to suction at 20 mmHg  Imaging: Dg Chest Port 1 View  Result Date: 08/11/2016 CLINICAL DATA:  Pneumothorax. EXAM: PORTABLE CHEST 1 VIEW COMPARISON:  One-view chest x-ray 08/10/2016. FINDINGS: The heart is enlarged. Mild edema is stable. Pacing wires are stable. A right-sided chest tube remains in place. The previously seen pneumothorax is no longer present. Right hemidiaphragm is elevated. Asymmetric right lower lobe airspace disease is present. A right effusion is again noted. IMPRESSION: 1. Interval resolution of pneumothorax. Chest tube is stable in position. 2. Persistent asymmetric right lower lobe airspace disease and effusion. Electronically Signed   By: Marin Robertshristopher  Mattern M.D.   On: 08/11/2016 09:11   Dg Chest Port 1 View  Result Date: 08/10/2016 CLINICAL DATA:  Pneumothorax. EXAM: PORTABLE CHEST 1 VIEW COMPARISON:  One-view chest x-ray 08/09/2016 FINDINGS: The heart is enlarged. Left subclavian pacemaker wires are in place. A right pleural effusion and airspace disease is similar the prior exam. A small right-sided pneumothorax has increased. Right-sided chest tube remains in place. There is no left-sided pneumothorax. A small left  pleural effusion is stable. IMPRESSION: 1. Re- emergence of small right-sided pneumothorax of 5-10% despite chest tube in place. 2. Large right pleural effusion and airspace disease on the right persists. Electronically Signed   By: Marin Roberts M.D.   On: 08/10/2016 09:03   Dg Chest Port 1 View  Result Date: 08/09/2016 CLINICAL DATA:  83 y/o male with multilobar pneumonia and bilateral pleural effusions. Right pneumothorax following thoracentesis. Initial  encounter. EXAM: PORTABLE CHEST 1 VIEW COMPARISON:  08/08/2016 and earlier. FINDINGS: Portable AP semi upright view at 0744 hours. Small caliber right chest tube remains in place. No residual right pneumothorax. Increased peripheral right lung opacity which may reflect loculated pleural fluid. Continued patchy and confluent right lower lung opacity. Continued confluent retrocardiac opacity. Stable cardiomegaly and mediastinal contours. Calcified aortic atherosclerosis. Stable left chest stool lead cardiac pacemaker. Visualized tracheal air column is within normal limits. IMPRESSION: 1. Stable right chest tube.  No residual pneumothorax. 2. Suspect some reaccumulation of partially loculated right pleural fluid. 3. Continued patchy right lung and dense retrocardiac opacity compatible with pneumonia. Electronically Signed   By: Odessa Fleming M.D.   On: 08/09/2016 08:03   Dg Chest Port 1 View  Result Date: 08/08/2016 CLINICAL DATA:  Chest tube placement for pneumothorax EXAM: PORTABLE CHEST 1 VIEW COMPARISON:  August 07, 2016 FINDINGS: A chest tube has been placed on the right. The right pneumothorax has largely resolved with only a minimal apical pneumothorax currently evident. No tension component. There is widespread interstitial edema with patchy airspace consolidation in both mid and lower lung zones. There are small pleural effusions bilaterally. There is cardiomegaly. The pulmonary vascularity is within normal limits. Pacemaker leads are attached to the right atrium and right ventricle. There is atherosclerotic calcification in the aorta. No bone lesions. IMPRESSION: Only minimal pneumothorax on the right following chest tube placement. There is interstitial and alveolar edema bilaterally in a pattern suggesting congestive heart failure. There may be some re-expansion phenomenon on the right as well. There is questionable superimposed pneumonia in the bases, particular in the left lower lobe. Stable cardiomegaly.  There is aortic atherosclerosis. Electronically Signed   By: Bretta Bang III M.D.   On: 08/08/2016 09:03   Ct Image Guided Drainage By Percutaneous Catheter  Result Date: 08/07/2016 INDICATION: Respiratory insufficiency. Enlarging right hydropneumothorax post thoracentesis. EXAM: CT IMAGE GUIDED RIGHT CHEST DRAINAGE BY PERCUTANEOUS CATHETER MEDICATIONS: The patient is currently admitted to the hospital and receiving intravenous antibiotics. The antibiotics were administered within an appropriate time frame prior to the initiation of the procedure. ANESTHESIA/SEDATION: Intravenous Fentanyl and Versed were administered as conscious sedation during continuous monitoring of the patient's level of consciousness and physiological / cardiorespiratory status by the radiology RN, with a total moderate sedation time of 22 minutes. COMPLICATIONS: None immediate. PROCEDURE: Informed written consent was obtained from the patient after a thorough discussion of the procedural risks, benefits and alternatives. All questions were addressed. Maximal Sterile Barrier Technique was utilized including caps, mask, sterile gowns, sterile gloves, sterile drape, hand hygiene and skin antiseptic. A timeout was performed prior to the initiation of the procedure. Select axial scans through the thorax were obtained. An appropriate skin entry site was determined and marked. Site was prepped and draped in usual sterile fashion. Maximal barrier sterile technique was utilized including mask, sterile gloves, sterile drape, hand hygiene and skin antiseptic. Under CT fluoroscopic guidance, a 19 gauge percutaneous entry needle was advanced into the right pleural space. Gas could be aspirated. An  Amplatz guidewire advanced easily, position confirmed on CT. Tract dilated to facilitate placement of a 16 French Thal drain catheter, directed towards the apex anteriorly. CT confirmed appropriate catheter position. Catheter secured externally with 0  Prolene suture and placed to a Pleur-Evac. The patient tolerated the procedure well. IMPRESSION: 1. Technically successful 16 French right chest tube placement for hydropneumothorax. Electronically Signed   By: Corlis Leak M.D.   On: 08/07/2016 17:42    Labs:  CBC:  Recent Labs  08/08/16 0231 08/09/16 0422 08/10/16 0342 08/11/16 0509  WBC 11.3* 9.1 10.0 10.6*  HGB 10.5* 10.1* 10.4* 10.6*  HCT 33.2* 32.3* 32.6* 33.7*  PLT 225 245 242 254    COAGS:  Recent Labs  08/07/16 0404 08/07/16 1211 08/08/16 0231 08/08/16 0925 08/09/16 0422  INR  --  1.33  --   --   --   APTT 59*  --  64* 79* 79*    BMP:  Recent Labs  08/07/16 0404 08/08/16 0231 08/09/16 0422 08/11/16 0509  NA 137 137 136 139  K 3.5 3.9 3.6 3.6  CL 93* 91* 94* 94*  CO2 36* 34* 35* 34*  GLUCOSE 128* 214* 149* 145*  BUN 20 22* 23* 27*  CALCIUM 8.7* 9.0 9.2 9.6  CREATININE 1.25* 1.21 1.21 1.23  GFRNONAA 50* 52* 52* 51*  GFRAA 57* 60* 60* 59*    LIVER FUNCTION TESTS:  Recent Labs  04/05/16 1102 06/12/16 2316 08/04/16 0708 08/05/16 1023  BILITOT 0.7 0.8 0.9 1.1  AST 13 16 16  14*  ALT 10 13* 16* 13*  ALKPHOS 71 63 60 60  PROT 7.3 6.7 6.2* 6.1*  ALBUMIN 4.0 3.7 3.1* 3.1*    Assessment and Plan:  Pneumothorax after thoracentesis  CXR PTX resolved (Reviewed by Dr. Miles Costain)  Continue CT to suction at 20 mmHg for another night  Probable water seal in am then repeat CXR later tomorrow or Tuesday am.  Electronically Signed: Gwynneth Macleod PA-C 08/11/2016, 11:34 AM   I spent a total of 15 Minutes at the the patient's bedside AND on the patient's hospital floor or unit, greater than 50% of which was counseling/coordinating care for f/u chest tube

## 2016-08-12 ENCOUNTER — Inpatient Hospital Stay (HOSPITAL_COMMUNITY): Payer: Medicare Other

## 2016-08-12 LAB — GLUCOSE, CAPILLARY
GLUCOSE-CAPILLARY: 146 mg/dL — AB (ref 65–99)
GLUCOSE-CAPILLARY: 164 mg/dL — AB (ref 65–99)
Glucose-Capillary: 155 mg/dL — ABNORMAL HIGH (ref 65–99)
Glucose-Capillary: 202 mg/dL — ABNORMAL HIGH (ref 65–99)
Glucose-Capillary: 191 mg/dL — ABNORMAL HIGH (ref 65–99)

## 2016-08-12 LAB — CBC
HCT: 31.6 % — ABNORMAL LOW (ref 39.0–52.0)
Hemoglobin: 10.1 g/dL — ABNORMAL LOW (ref 13.0–17.0)
MCH: 27.4 pg (ref 26.0–34.0)
MCHC: 32 g/dL (ref 30.0–36.0)
MCV: 85.6 fL (ref 78.0–100.0)
PLATELETS: 257 10*3/uL (ref 150–400)
RBC: 3.69 MIL/uL — ABNORMAL LOW (ref 4.22–5.81)
RDW: 15.2 % (ref 11.5–15.5)
WBC: 12.5 10*3/uL — ABNORMAL HIGH (ref 4.0–10.5)

## 2016-08-12 LAB — BLOOD GAS, ARTERIAL
Acid-Base Excess: 9.9 mmol/L — ABNORMAL HIGH (ref 0.0–2.0)
Bicarbonate: 33.6 mmol/L — ABNORMAL HIGH (ref 20.0–28.0)
Drawn by: 301361
O2 Saturation: 92.4 %
PCO2 ART: 41.2 mmHg (ref 32.0–48.0)
Patient temperature: 97.6
pH, Arterial: 7.519 — ABNORMAL HIGH (ref 7.350–7.450)
pO2, Arterial: 62.1 mmHg — ABNORMAL LOW (ref 83.0–108.0)

## 2016-08-12 LAB — HEPARIN LEVEL (UNFRACTIONATED): HEPARIN UNFRACTIONATED: 0.42 [IU]/mL (ref 0.30–0.70)

## 2016-08-12 NOTE — Clinical Social Work Note (Signed)
CSW continues to follow patient for discharge needs. CSW will keep [facility] updated on patient's condition.  Roddie McBryant Cydnie Deason MSW, [place]CS[place], [place], [phone]

## 2016-08-12 NOTE — Progress Notes (Signed)
Pt acting more lethargic than earlier in shift.  Patient only able to answer with one word answers and dozes off during attempts to communicate. Vital signs within normal limits. MD notified, Blood gas ordered.

## 2016-08-12 NOTE — Progress Notes (Signed)
PROGRESS NOTE        PATIENT DETAILS Name: Kyle [name] Age: 83 y.o. Sex: male Date of Birth: 04-27-1926 Admit Date: 08/04/2016 Admitting Physician Haydee Salter, MD ZOX:[id number] Homero Fellers, MD  Brief Narrative: Patient is a 83 y.o. male with history of atrial fibrillation, trifascicular block and pacemaker placement, HTN, hyperlipidemia, diabetes, COPD, OSA, diastolic heart failure, and pulmonary HTN. He was admitted for acute respiratory failure with hypoxia due to acute diastolic CHF and bilateral transudative pleural effusion. He was started on Lasix and underwent thoracocentesis on 2/19, unfortunately he developed a small Pneumothorax post thoracocentesis requiring chest tube placement. See below for further details.   Subjective: No major issues overnight-denies any SOB  Assessment/Plan: Acute respiratory failure with hypoxia due to bilateral pleural effusion and acute diastolic heart failure: Better, appears more compensated, weight decreased to 156 pounds (171 pounds on admission) and > -14 L since admission. Continue Lasix.  Right pleural effusion: Likely secondary to heart failure-transudative by light's criteria. Continue diuresis-underwent thoracocentesis on 2/19.   Right-sided pneumothorax: Iatrogenic-likely due to recent thoracocentesis. Chest tube placed on 2/21, and was transitioned to water seal on 2/23. CXR on 2/26 shows no PTX. Chest tube management is per IR.   Possible pneumonia: Likely has atelectasis due to effusion, no fever, had mild leukocytosis, appears nontoxic-Levaquin stopped 2/22.  Type 2 Diabetes Mellitus: CBGs minimally elevated- Continue sliding scale insulin. Plan on resuming oral medications on discharge  Dyslipidemia: Continue simvastatin.   Hypertension: Controlled- Continue metoprolol and lisinopril.   COPD: Stable without any signs of exacerbation-continue albuterol neb.   Chronic Atrial fibrillation: Eliquis  switched to heparin-continue for now till chest tube removed. Continue metoprolol. [place] vasc 2 score of at least 4   OSA. BiPAP daily at bedtime.   DVT Prophylaxis: Heparin   Code Status: DNR  Family Communication: None at bedside this am  Disposition Plan: Remain inpatient- SNF on discharge-once Chest tube has been removed.  Antimicrobial agents: Anti-infectives    Start     Dose/Rate Route Frequency Ordered Stop   08/08/16 1400  levofloxacin (LEVAQUIN) tablet 750 mg  Status:  Discontinued     750 mg Oral Every 48 hours 08/07/16 1102 08/08/16 1140   08/06/16 1400  levofloxacin (LEVAQUIN) tablet 750 mg  Status:  Discontinued     750 mg Oral Every 24 hours 08/06/16 1253 08/07/16 1102   08/05/16 0230  vancomycin (VANCOCIN) IVPB 1000 mg/200 mL premix  Status:  Discontinued     1,000 mg 200 mL/hr over 60 Minutes Intravenous Every 12 hours 08/04/16 1416 08/06/16 1205   08/04/16 1430  ceFEPIme (MAXIPIME) 1 g in dextrose 5 % 50 mL IVPB  Status:  Discontinued     1 g 100 mL/hr over 30 Minutes Intravenous Every 8 hours 08/04/16 1407 08/06/16 1205   08/04/16 1430  vancomycin (VANCOCIN) 1,750 mg in sodium chloride 0.9 % 500 mL IVPB     1,750 mg 250 mL/hr over 120 Minutes Intravenous  Once 08/04/16 1416 08/04/16 1751   08/04/16 0830  vancomycin (VANCOCIN) 1,250 mg in sodium chloride 0.9 % 250 mL IVPB     1,250 mg 166.7 mL/hr over 90 Minutes Intravenous  Once 08/04/16 0758 08/04/16 1020   08/04/16 0800  piperacillin-tazobactam (ZOSYN) IVPB 3.375 g  Status:  Discontinued     3.375 g 12.5 mL/hr over 240 Minutes  Intravenous Every 8 hours 08/04/16 0741 08/04/16 0745   08/04/16 0800  piperacillin-tazobactam (ZOSYN) IVPB 3.375 g     3.375 g 100 mL/hr over 30 Minutes Intravenous  Once 08/04/16 0758 08/04/16 0850   08/04/16 0745  vancomycin (VANCOCIN) 1,250 mg in sodium chloride 0.9 % 250 mL IVPB  Status:  Discontinued     1,250 mg 166.7 mL/hr over 90 Minutes Intravenous  Once 08/04/16 0741  08/04/16 0745      Procedures: 2/19>>Thoracocentesis 2/21>>Chest tube placement  CONSULTS:  Radiology  Time spent: 25 minutes-Greater than 50% of this time was spent in counseling, explanation of diagnosis, planning of further management, and coordination of care.  MEDICATIONS: Scheduled Meds: . albuterol  2.5 mg Nebulization TID  . doxazosin  4 mg Oral Daily  . escitalopram  10 mg Oral Daily  . furosemide  40 mg Oral BID  . insulin aspart  0-5 Units Subcutaneous QHS  . insulin aspart  0-9 Units Subcutaneous TID WC  . latanoprost  1 drop Both Eyes QHS  . lisinopril  20 mg Oral Daily  . mouth rinse  15 mL Mouth Rinse BID  . metoprolol succinate  25 mg Oral Daily  . nitroGLYCERIN  0.5 inch Topical Q6H  . polyethylene glycol  17 g Oral Daily  . simvastatin  20 mg Oral QHS   Continuous Infusions: . heparin 1,250 Units/hr (08/11/16 1612)   PRN Meds:.acetaminophen, albuterol, bisacodyl, ondansetron (ZOFRAN) IV, senna-docusate   PHYSICAL EXAM: Vital signs: Vitals:   08/11/16 1928 08/11/16 2035 08/12/16 0332 08/12/16 0842  BP:  (!) 168/85 (!) 125/55   Pulse:  69 70   Resp:  20 (!) 23   Temp:  98.2 F (36.8 C) 98.7 F (37.1 C)   TempSrc:      SpO2: 96% 98% 98% 90%  Weight:   71.1 kg (156 lb 12.8 oz)   Height:       Filed Weights   08/10/16 0500 08/11/16 0800 08/12/16 0332  Weight: 72.6 kg (160 lb) 72.1 kg (158 lb 14.4 oz) 71.1 kg (156 lb 12.8 oz)   Body mass index is 22.18 kg/m.   General appearance :Awake, alert, not in any distress. Speech Clear. Chronically sick appearing. Eyes:, pupils equally reactive to light and accomodation,no scleral icterus.Pink conjunctiva HEENT: Atraumatic and Normocephalic Neck: supple, no JVD. No cervical lymphadenopathy. No thyromegaly Resp:Good air entry -anteriorly CVS: S1 S2 regular, no murmurs.  GI: Bowel sounds present, Non tender and not distended with no gaurding, rigidity or rebound.No organomegaly Extremities: B/L Lower  Ext shows no edema, both legs are warm to touch Neurology:  speech clear,Non focal, sensation is grossly intact. Psychiatric: Normal judgment and insight. Alert and oriented x 3. Normal mood. Musculoskeletal:No digital cyanosis Skin:No Rash, warm and dry Wounds:N/A  I have personally reviewed following labs and imaging studies  LABORATORY DATA: CBC:  Recent Labs Lab 08/08/16 0231 08/09/16 0422 08/10/16 0342 08/11/16 0509 08/12/16 0309  WBC 11.3* 9.1 10.0 10.6* 12.5*  HGB 10.5* 10.1* 10.4* 10.6* 10.1*  HCT 33.2* 32.3* 32.6* 33.7* 31.6*  MCV 87.6 87.1 86.5 86.9 85.6  PLT 225 245 242 254 257    Basic Metabolic Panel:  Recent Labs Lab 08/06/16 0639 08/07/16 0404 08/08/16 0231 08/09/16 0422 08/11/16 0509  NA 137 137 137 136 139  K 3.7 3.5 3.9 3.6 3.6  CL 96* 93* 91* 94* 94*  CO2 34* 36* 34* 35* 34*  GLUCOSE 168* 128* 214* 149* 145*  BUN 14 20 22*  23* 27*  CREATININE 0.88 1.25* 1.21 1.21 1.23  CALCIUM 8.6* 8.7* 9.0 9.2 9.6    GFR: Estimated Creatinine Clearance: 41.7 mL/min (by C-G formula based on SCr of 1.23 mg/dL).  Liver Function Tests:  Recent Labs Lab 08/05/16 1023  AST 14*  ALT 13*  ALKPHOS 60  BILITOT 1.1  PROT 6.1*  ALBUMIN 3.1*   No results for input(s): LIPASE, AMYLASE in the last 168 hours. No results for input(s): AMMONIA in the last 168 hours.  Coagulation Profile:  Recent Labs Lab 08/07/16 1211  INR 1.33    Cardiac Enzymes: No results for input(s): CKTOTAL, CKMB, CKMBINDEX, TROPONINI in the last 168 hours.  BNP (last 3 results)  Recent Labs  04/05/16 1102  PROBNP 367.0*    HbA1C: No results for input(s): HGBA1C in the last 72 hours.  CBG:  Recent Labs Lab 08/11/16 0733 08/11/16 1111 08/11/16 1622 08/11/16 2043 08/12/16 0759  GLUCAP 146* 189* 170* 178* 164*    Lipid Profile: No results for input(s): CHOL, HDL, LDLCALC, TRIG, CHOLHDL, LDLDIRECT in the last 72 hours.  Thyroid Function Tests: No results for  input(s): TSH, T4TOTAL, FREET4, T3FREE, THYROIDAB in the last 72 hours.  Anemia Panel: No results for input(s): VITAMINB12, FOLATE, FERRITIN, TIBC, IRON, RETICCTPCT in the last 72 hours.  Urine analysis:    Component Value Date/Time   COLORURINE AMBER (A) 06/12/2016 1748   APPEARANCEUR HAZY (A) 06/12/2016 1748   LABSPEC 1.027 06/12/2016 1748   PHURINE 5.0 06/12/2016 1748   GLUCOSEU NEGATIVE 06/12/2016 1748   HGBUR NEGATIVE 06/12/2016 1748   HGBUR negative 04/05/2009 0856   BILIRUBINUR NEGATIVE 06/12/2016 1748   BILIRUBINUR n 09/20/2015 0840   KETONESUR NEGATIVE 06/12/2016 1748   PROTEINUR 100 (A) 06/12/2016 1748   UROBILINOGEN 0.2 09/20/2015 0840   UROBILINOGEN 0.2 04/05/2009 0856   NITRITE NEGATIVE 06/12/2016 1748   LEUKOCYTESUR NEGATIVE 06/12/2016 1748    Sepsis Labs: Lactic Acid, Venous    Component Value Date/Time   LATICACIDVEN 1.23 08/04/2016 1107    MICROBIOLOGY: Recent Results (from the past 240 hour(s))  Blood culture (routine x 2)     Status: None   Collection Time: 08/04/16  7:50 AM  Result Value Ref Range Status   Specimen Description BLOOD RIGHT HAND  Final   Special Requests BOTTLES DRAWN AEROBIC AND ANAEROBIC  5CC  Final   Culture NO GROWTH 5 DAYS  Final   Report Status 08/09/2016 FINAL  Final  Blood culture (routine x 2)     Status: None   Collection Time: 08/04/16  8:01 AM  Result Value Ref Range Status   Specimen Description BLOOD RIGHT ANTECUBITAL  Final   Special Requests BOTTLES DRAWN AEROBIC AND ANAEROBIC  5CC  Final   Culture NO GROWTH 5 DAYS  Final   Report Status 08/09/2016 FINAL  Final  Respiratory Panel by PCR     Status: None   Collection Time: 08/04/16  9:56 AM  Result Value Ref Range Status   Adenovirus NOT DETECTED NOT DETECTED Final   Coronavirus 229E NOT DETECTED NOT DETECTED Final   Coronavirus HKU1 NOT DETECTED NOT DETECTED Final   Coronavirus NL63 NOT DETECTED NOT DETECTED Final   Coronavirus OC43 NOT DETECTED NOT DETECTED  Final   Metapneumovirus NOT DETECTED NOT DETECTED Final   Rhinovirus / Enterovirus NOT DETECTED NOT DETECTED Final   Influenza A NOT DETECTED NOT DETECTED Final   Influenza B NOT DETECTED NOT DETECTED Final   Parainfluenza Virus 1 NOT DETECTED NOT  DETECTED Final   Parainfluenza Virus 2 NOT DETECTED NOT DETECTED Final   Parainfluenza Virus 3 NOT DETECTED NOT DETECTED Final   Parainfluenza Virus 4 NOT DETECTED NOT DETECTED Final   Respiratory Syncytial Virus NOT DETECTED NOT DETECTED Final   Bordetella pertussis NOT DETECTED NOT DETECTED Final   Chlamydophila pneumoniae NOT DETECTED NOT DETECTED Final   Mycoplasma pneumoniae NOT DETECTED NOT DETECTED Final  MRSA PCR Screening     Status: None   Collection Time: 08/04/16  2:44 PM  Result Value Ref Range Status   MRSA by PCR NEGATIVE NEGATIVE Final    Comment:        The GeneXpert MRSA Assay (FDA approved for NASAL specimens only), is one component of a comprehensive MRSA colonization surveillance program. It is not intended to diagnose MRSA infection nor to guide or monitor treatment for MRSA infections.   Culture, body fluid-bottle     Status: None   Collection Time: 08/05/16  1:48 PM  Result Value Ref Range Status   Specimen Description FLUID PLEURAL RIGHT  Final   Special Requests NONE  Final   Culture NO GROWTH 5 DAYS  Final   Report Status 08/10/2016 FINAL  Final  Gram stain     Status: None   Collection Time: 08/05/16  1:48 PM  Result Value Ref Range Status   Specimen Description FLUID  Final   Special Requests NONE  Final   Gram Stain   Final    WBC PRESENT, PREDOMINANTLY PMN NO ORGANISMS SEEN CYTOSPIN SMEAR    Report Status 08/05/2016 FINAL  Final    RADIOLOGY STUDIES/RESULTS:    LOS: 8 days   Corwin LevinsSelby Rouch, PA-S  If 7PM-7AM, please contact night-coverage [email or web address] Password [email or web address] 08/12/2016, 9:29 AM  Attending MD note  Patient was seen, examined,treatment plan was discussed with the PA-S.  I have  personally reviewed the clinical findings, lab, imaging studies and management of this patient in detail. I agree with the documentation, as recorded by the PA-S.   Patient is Essentially unchanged-lying comfortably in bed.  On Exam: Gen. exam: Awake, alert, not in any distress Chest: Good air entry bilaterally, no rhonchi or rales CVS: S1-S2 regular, no murmurs Abdomen: Soft, nontender and nondistended Neurology: Non-focal Skin: No rash or lesions  Impression Acute diastolic heart failure-more compensated Pleural effusion: Likely secondary to above status post thoracocentesis-now with iatrogenic pneumothorax  Plan Continue oral Lasix IR following and managing chest tube Back to SNF when a chest tube removed  Rest as above  [facility] Regional Asc LLCGHIMIRE,Kenyah Luba [facility]

## 2016-08-12 NOTE — Progress Notes (Signed)
Physical Therapy Discharge Patient Details Name: Kyle CairoRobert P Sickman MRN: [id number] DOB: 14-May-1926 Today's Date: 08/12/2016 Time:  -     Patient discharged from PT services secondary to patient has refused 3 (three) consecutive times without medical reason.  Please see latest therapy progress note for current level of functioning and progress toward goals.    Progress and discharge plan discussed with patient and/or caregiver: Patient/Caregiver agrees with plan. Pt specifically requested that therapy stop coming to see him and he just wants to be left alone. He also reports that he understands the need for mobility and does not care at this point. Let him know that if he changes his mind to let MD know and we will come back at that time.   GP   Lyanne CoVictoria Mikahla Wisor, PT  Acute Rehab Services  [phone]   Lawana ChambersVictoria L Jasimine Simms 08/12/2016, 11:49 AM

## 2016-08-12 NOTE — Plan of Care (Signed)
Problem: Activity: Goal: Risk for activity intolerance will decrease Outcome: Progressing Patient ambulated to the recliner with assistance of RN and steady. Patient tolerated activity well

## 2016-08-12 NOTE — Progress Notes (Signed)
Patient ID: Kyle [name], male   DOB: 1925/07/26, 83 y.o.   MRN: [id number]    Referring Physician(s): Dr. Jeoffrey Massed  Supervising Physician: Oley Balm  Patient Status: [facility] - In-pt  Chief Complaint: [name]  Subjective: Patient with no complaints.  No SOB.  Off Oxygen.  Allergies: Patient has no known allergies.  Medications: Prior to Admission medications   Medication Sig Start Date End Date Taking? Authorizing Provider  albuterol (PROVENTIL,VENTOLIN) 90 MCG/ACT inhaler Inhale 2 puffs into the lungs 2 (two) times daily.     Yes Historical Provider, MD  apixaban (ELIQUIS) 2.5 MG TABS tablet Take 1 tablet (2.5 mg total) by mouth 2 (two) times daily. 11/23/15  Yes Newman Nip, NP  doxazosin (CARDURA) 4 MG tablet TAKE 1 TABLET EACH DAY. 03/07/16  Yes Gordy Savers, MD  escitalopram (LEXAPRO) 10 MG tablet Take 10 mg by mouth daily.   Yes Historical Provider, MD  Fluticasone Propionate, Inhal, (FLOVENT DISKUS) 100 MCG/BLIST AEPB Inhale 1 puff into the lungs 2 (two) times daily.   Yes Historical Provider, MD  latanoprost (XALATAN) 0.005 % ophthalmic solution 1 drop at bedtime.   Yes Historical Provider, MD  lisinopril (PRINIVIL,ZESTRIL) 40 MG tablet TAKE 1 TABLET ONCE DAILY. 08/17/15  Yes Gordy Savers, MD  metFORMIN (GLUCOPHAGE-XR) 500 MG 24 hr tablet Take 1,500 mg by mouth daily with breakfast.   Yes Historical Provider, MD  metoprolol succinate (TOPROL-XL) 50 MG 24 hr tablet Take 1/2 tablet by mouth daily (25mg ) 11/02/15  Yes Newman Nip, NP  doxycycline (VIBRAMYCIN) 100 MG capsule Take 1 capsule (100 mg total) by mouth 2 (two) times daily. Patient not taking: Reported on 08/04/2016 06/13/16   [name], MD  furosemide (LASIX) 40 MG tablet 1 half tablet daily as needed for fluid control 05/14/16   Gordy Savers, MD  glucose blood (PRECISION XTRA TEST STRIPS) test strip 1 each by Other route daily as needed for other. Use as  instructed 06/24/13   Gordy Savers, MD    Vital Signs: BP (!) 125/55   Pulse 70   Temp 98.7 F (37.1 C)   Resp (!) 23   Ht 5' 10.5" (1.791 m)   Wt 156 lb 12.8 oz (71.1 kg)   SpO2 90%   BMI 22.18 kg/m   Physical Exam: Chest: chest tube in place and site is c/d/i  No air leak.  Output minimal  Imaging: Dg Chest Port 1 View  Result Date: 08/11/2016 CLINICAL DATA:  Pneumothorax. EXAM: PORTABLE CHEST 1 VIEW COMPARISON:  One-view chest x-ray 08/10/2016. FINDINGS: The heart is enlarged. Mild edema is stable. Pacing wires are stable. A right-sided chest tube remains in place. The previously seen pneumothorax is no longer present. Right hemidiaphragm is elevated. Asymmetric right lower lobe airspace disease is present. A right effusion is again noted. IMPRESSION: 1. Interval resolution of pneumothorax. Chest tube is stable in position. 2. Persistent asymmetric right lower lobe airspace disease and effusion. Electronically Signed   By: Marin Roberts M.D.   On: 08/11/2016 09:11   Dg Chest Port 1 View  Result Date: 08/10/2016 CLINICAL DATA:  Pneumothorax. EXAM: PORTABLE CHEST 1 VIEW COMPARISON:  One-view chest x-ray 08/09/2016 FINDINGS: The heart is enlarged. Left subclavian pacemaker wires are in place. A right pleural effusion and airspace disease is similar the prior exam. A small right-sided pneumothorax has increased. Right-sided chest tube remains in place. There is no left-sided pneumothorax. A small left pleural effusion  is stable. IMPRESSION: 1. Re- emergence of small right-sided pneumothorax of 5-10% despite chest tube in place. 2. Large right pleural effusion and airspace disease on the right persists. Electronically Signed   By: Marin Roberts M.D.   On: 08/10/2016 09:03   Dg Chest Port 1 View  Result Date: 08/09/2016 CLINICAL DATA:  83 y/o male with multilobar pneumonia and bilateral pleural effusions. Right pneumothorax following thoracentesis. Initial encounter.  EXAM: PORTABLE CHEST 1 VIEW COMPARISON:  08/08/2016 and earlier. FINDINGS: Portable AP semi upright view at 0744 hours. Small caliber right chest tube remains in place. No residual right pneumothorax. Increased peripheral right lung opacity which may reflect loculated pleural fluid. Continued patchy and confluent right lower lung opacity. Continued confluent retrocardiac opacity. Stable cardiomegaly and mediastinal contours. Calcified aortic atherosclerosis. Stable left chest stool lead cardiac pacemaker. Visualized tracheal air column is within normal limits. IMPRESSION: 1. Stable right chest tube.  No residual pneumothorax. 2. Suspect some reaccumulation of partially loculated right pleural fluid. 3. Continued patchy right lung and dense retrocardiac opacity compatible with pneumonia. Electronically Signed   By: Odessa Fleming M.D.   On: 08/09/2016 08:03   Dg Chest Port 1v Same Day  Result Date: 08/12/2016 CLINICAL DATA:  Shortness of breath. EXAM: PORTABLE CHEST 1 VIEW COMPARISON:  08/11/2016 . FINDINGS: Right chest tube in stable position. No pneumothorax. Cardiac pacer with lead tips in right atrium and right ventricle cardiomegaly with diffuse bilateral pulmonary interstitial prominence and bilateral pleural effusions consistent with congestive heart failure. Slight worsening from prior exam . IMPRESSION: 1. Right chest tube in stable position.  No pneumothorax. 2. Cardiac pacer in stable position. Cardiomegaly with slight worsening of pulmonary interstitial edema and bilateral pleural effusions. Electronically Signed   By: Maisie Fus  Register   On: 08/12/2016 07:30    Labs:  CBC:  Recent Labs  08/09/16 0422 08/10/16 0342 08/11/16 0509 08/12/16 0309  WBC 9.1 10.0 10.6* 12.5*  HGB 10.1* 10.4* 10.6* 10.1*  HCT 32.3* 32.6* 33.7* 31.6*  PLT 245 242 254 257    COAGS:  Recent Labs  08/07/16 0404 08/07/16 1211 08/08/16 0231 08/08/16 0925 08/09/16 0422  INR  --  1.33  --   --   --   APTT 59*  --   64* 79* 79*    BMP:  Recent Labs  08/07/16 0404 08/08/16 0231 08/09/16 0422 08/11/16 0509  NA 137 137 136 139  K 3.5 3.9 3.6 3.6  CL 93* 91* 94* 94*  CO2 36* 34* 35* 34*  GLUCOSE 128* 214* 149* 145*  BUN 20 22* 23* 27*  CALCIUM 8.7* 9.0 9.2 9.6  CREATININE 1.25* 1.21 1.21 1.23  GFRNONAA 50* 52* 52* 51*  GFRAA 57* 60* 60* 59*    LIVER FUNCTION TESTS:  Recent Labs  04/05/16 1102 06/12/16 2316 08/04/16 0708 08/05/16 1023  BILITOT 0.7 0.8 0.9 1.1  AST 13 16 16  14*  ALT 10 13* 16* 13*  ALKPHOS 71 63 60 60  PROT 7.3 6.7 6.2* 6.1*  ALBUMIN 4.0 3.7 3.1* 3.1*    Assessment and Plan: 1. [name], s/p chest tube placement -chest tube placed to waterseal as CXR shows no PTX.  We will repeat a CXR in the am and if no PTX, then we can hopefully remove the chest tube at that time.  Electronically Signed: Letha Cape 08/12/2016, 12:16 PM   I spent a total of 15 Minutes at the the patient's bedside AND on the patient's hospital floor or  unit, greater than 50% of which was counseling/coordinating care for PTX after thora

## 2016-08-12 NOTE — Progress Notes (Signed)
OT Cancellation Note  Patient Details Name: Kyle CairoRobert P [name] MRN: [id number] DOB: 1926-03-19   Cancelled Treatment:    Reason Eval/Treat Not Completed: Other (comment). Pt politely reported he had bee up earlier to sit in recliner and he did not feel like getting up again.   Evette GeorgesLeonard, Aryaa Bunting Eva [phone] 08/12/2016, 2:47 PM

## 2016-08-12 NOTE — Progress Notes (Signed)
ANTICOAGULATION CONSULT NOTE - Follow Up Consult  Pharmacy Consult:  Heparin Indication: atrial fibrillation  No Known Allergies  Patient Measurements: Height: 5' 10.5" (179.1 cm) Weight: 156 lb 12.8 oz (71.1 kg) IBW/kg (Calculated) : 74.15 Heparin Dosing Weight: 71 kg  Vital Signs: Temp: 98.7 F (37.1 C) (02/26 0332) BP: 125/55 (02/26 0332) Pulse Rate: 70 (02/26 0332)  Labs:  Recent Labs  08/10/16 0342 08/11/16 0509 08/12/16 0309  HGB 10.4* 10.6* 10.1*  HCT 32.6* 33.7* 31.6*  PLT 242 254 257  HEPARINUNFRC 0.60 0.53 0.42  CREATININE  --  1.23  --     Estimated Creatinine Clearance: 41.7 mL/min (by C-G formula based on SCr of 1.23 mg/dL).    Assessment: Kyle [name] on Eliquis PTA for history of AFib, which was placed on hold for thoracentesis on 08/05/16 and then resumed that evening.  He was switched to IV heparin on 08/06/16.  Now s/p chest tube placement for pheumothorax.  Heparin level therapeutic at 0.42 on 1250 units/hr. CBC stable. No s/sx bleeding noted.   Goal of Therapy:  Heparin level 0.3-0.7 units/ml Monitor platelets by anticoagulation protocol: Yes    Plan:  - Continue IV heparin 1250 units/hr - Monitor closely for s/sx bleeding - Heparin levels daily - Follow up restart PO AC  Payden Bonus, Pharm.D., BCPS Clinical Pharmacist Pager [phone] 08/12/2016 12:52 PM

## 2016-08-12 NOTE — Care Management Important Message (Signed)
Important Message  Patient Details  Name: Kyle [name] MRN: [id number] Date of Birth: 1926-04-13   Medicare Important Message Given:  Yes    Taren Toops Abena 08/12/2016, 1:35 PM

## 2016-08-13 ENCOUNTER — Inpatient Hospital Stay (HOSPITAL_COMMUNITY): Payer: Medicare Other

## 2016-08-13 LAB — CBC
HCT: 32.4 % — ABNORMAL LOW (ref 39.0–52.0)
Hemoglobin: 10.2 g/dL — ABNORMAL LOW (ref 13.0–17.0)
MCH: 27.2 pg (ref 26.0–34.0)
MCHC: 31.5 g/dL (ref 30.0–36.0)
MCV: 86.4 fL (ref 78.0–100.0)
PLATELETS: 236 10*3/uL (ref 150–400)
RBC: 3.75 MIL/uL — AB (ref 4.22–5.81)
RDW: 15.5 % (ref 11.5–15.5)
WBC: 10.4 10*3/uL (ref 4.0–10.5)

## 2016-08-13 LAB — GLUCOSE, CAPILLARY
GLUCOSE-CAPILLARY: 175 mg/dL — AB (ref 65–99)
GLUCOSE-CAPILLARY: 184 mg/dL — AB (ref 65–99)
Glucose-Capillary: 154 mg/dL — ABNORMAL HIGH (ref 65–99)
Glucose-Capillary: 184 mg/dL — ABNORMAL HIGH (ref 65–99)

## 2016-08-13 LAB — HEPARIN LEVEL (UNFRACTIONATED): Heparin Unfractionated: 0.35 IU/mL (ref 0.30–0.70)

## 2016-08-13 MED ORDER — FUROSEMIDE 40 MG PO TABS
40.0000 mg | ORAL_TABLET | Freq: Every day | ORAL | Status: DC
  Filled 2016-08-13: qty 1

## 2016-08-13 NOTE — Progress Notes (Signed)
Referring Physician(s): Dr Windell Norfolk  Supervising Physician: Ruel Favors  Patient Status:  [facility] - In-pt  Chief Complaint:  PTX post thoracentesis 2/19 Chest tube placed 2/21  Subjective:  Chest tube has again been on water seal since 2/26 am 2/27 CXR: IMPRESSION: Mild increase RIGHT pneumothorax, up to 5%. Otherwise little change Aeration. Pt is asymptomatic denies chest pain Mild sob----normal for pt.  Discussed with Dr Miles Costain Will recheck cxr 1100 am   Allergies: Patient has no known allergies.  Medications: Prior to Admission medications   Medication Sig Start Date End Date Taking? Authorizing Provider  albuterol (PROVENTIL,VENTOLIN) 90 MCG/ACT inhaler Inhale 2 puffs into the lungs 2 (two) times daily.     Yes Historical Provider, MD  apixaban (ELIQUIS) 2.5 MG TABS tablet Take 1 tablet (2.5 mg total) by mouth 2 (two) times daily. 11/23/15  Yes Newman Nip, NP  doxazosin (CARDURA) 4 MG tablet TAKE 1 TABLET EACH DAY. 03/07/16  Yes Gordy Savers, MD  escitalopram (LEXAPRO) 10 MG tablet Take 10 mg by mouth daily.   Yes Historical Provider, MD  Fluticasone Propionate, Inhal, (FLOVENT DISKUS) 100 MCG/BLIST AEPB Inhale 1 puff into the lungs 2 (two) times daily.   Yes Historical Provider, MD  latanoprost (XALATAN) 0.005 % ophthalmic solution 1 drop at bedtime.   Yes Historical Provider, MD  lisinopril (PRINIVIL,ZESTRIL) 40 MG tablet TAKE 1 TABLET ONCE DAILY. 08/17/15  Yes Gordy Savers, MD  metFORMIN (GLUCOPHAGE-XR) 500 MG 24 hr tablet Take 1,500 mg by mouth daily with breakfast.   Yes Historical Provider, MD  metoprolol succinate (TOPROL-XL) 50 MG 24 hr tablet Take 1/2 tablet by mouth daily (25mg ) 11/02/15  Yes Newman Nip, NP  doxycycline (VIBRAMYCIN) 100 MG capsule Take 1 capsule (100 mg total) by mouth 2 (two) times daily. Patient not taking: Reported on 08/04/2016 06/13/16   [name], MD  furosemide (LASIX) 40 MG tablet 1 half tablet daily as needed  for fluid control 05/14/16   Gordy Savers, MD  glucose blood (PRECISION XTRA TEST STRIPS) test strip 1 each by Other route daily as needed for other. Use as instructed 06/24/13   Gordy Savers, MD     Vital Signs: BP (!) 105/56 (BP Location: Left Arm)   Pulse 70   Temp 97.6 F (36.4 C) (Oral)   Resp (!) 22   Ht 5' 10.5" (1.791 m)   Wt 164 lb (74.4 kg)   SpO2 95%   BMI 23.20 kg/m   Physical Exam  Constitutional: He is oriented to person, place, and time.  Pulmonary/Chest: Effort normal and breath sounds normal.  Neurological: He is alert and oriented to person, place, and time.  Skin: Skin is warm and dry.  Site of Rt chest tube clean and dry NT No bleeding No air leak with cough  Nursing note and vitals reviewed.   Imaging: Dg Chest Port 1 View  Result Date: 08/13/2016 CLINICAL DATA:  Continued surveillance pneumothorax. EXAM: PORTABLE CHEST 1 VIEW COMPARISON:  08/12/2016. FINDINGS: RIGHT lateral pneumothorax is increased, up to 5%. RIGHT chest tube unchanged. Cardiomegaly. Mild basilar opacities, likely representing edema, atelectasis, possible infiltrate. BILATERAL effusions. Unchanged pacemaker. Thoracic atherosclerosis. IMPRESSION: Mild increase RIGHT pneumothorax, up to 5%. Otherwise little change aeration. Electronically Signed   By: Elsie Stain M.D.   On: 08/13/2016 07:22   Dg Chest Port 1 View  Result Date: 08/11/2016 CLINICAL DATA:  Pneumothorax. EXAM: PORTABLE CHEST 1 VIEW COMPARISON:  One-view chest  x-ray 08/10/2016. FINDINGS: The heart is enlarged. Mild edema is stable. Pacing wires are stable. A right-sided chest tube remains in place. The previously seen pneumothorax is no longer present. Right hemidiaphragm is elevated. Asymmetric right lower lobe airspace disease is present. A right effusion is again noted. IMPRESSION: 1. Interval resolution of pneumothorax. Chest tube is stable in position. 2. Persistent asymmetric right lower lobe airspace disease  and effusion. Electronically Signed   By: Marin Robertshristopher  Mattern M.D.   On: 08/11/2016 09:11   Dg Chest Port 1 View  Result Date: 08/10/2016 CLINICAL DATA:  Pneumothorax. EXAM: PORTABLE CHEST 1 VIEW COMPARISON:  One-view chest x-ray 08/09/2016 FINDINGS: The heart is enlarged. Left subclavian pacemaker wires are in place. A right pleural effusion and airspace disease is similar the prior exam. A small right-sided pneumothorax has increased. Right-sided chest tube remains in place. There is no left-sided pneumothorax. A small left pleural effusion is stable. IMPRESSION: 1. Re- emergence of small right-sided pneumothorax of 5-10% despite chest tube in place. 2. Large right pleural effusion and airspace disease on the right persists. Electronically Signed   By: Marin Robertshristopher  Mattern M.D.   On: 08/10/2016 09:03   Dg Chest Port 1v Same Day  Result Date: 08/12/2016 CLINICAL DATA:  Shortness of breath. EXAM: PORTABLE CHEST 1 VIEW COMPARISON:  08/11/2016 . FINDINGS: Right chest tube in stable position. No pneumothorax. Cardiac pacer with lead tips in right atrium and right ventricle cardiomegaly with diffuse bilateral pulmonary interstitial prominence and bilateral pleural effusions consistent with congestive heart failure. Slight worsening from prior exam . IMPRESSION: 1. Right chest tube in stable position.  No pneumothorax. 2. Cardiac pacer in stable position. Cardiomegaly with slight worsening of pulmonary interstitial edema and bilateral pleural effusions. Electronically Signed   By: Maisie Fushomas  Register   On: 08/12/2016 07:30    Labs:  CBC:  Recent Labs  08/10/16 0342 08/11/16 0509 08/12/16 0309 08/13/16 0405  WBC 10.0 10.6* 12.5* 10.4  HGB 10.4* 10.6* 10.1* 10.2*  HCT 32.6* 33.7* 31.6* 32.4*  PLT 242 254 257 236    COAGS:  Recent Labs  08/07/16 0404 08/07/16 1211 08/08/16 0231 08/08/16 0925 08/09/16 0422  INR  --  1.33  --   --   --   APTT 59*  --  64* 79* 79*    BMP:  Recent Labs   08/07/16 0404 08/08/16 0231 08/09/16 0422 08/11/16 0509  NA 137 137 136 139  K 3.5 3.9 3.6 3.6  CL 93* 91* 94* 94*  CO2 36* 34* 35* 34*  GLUCOSE 128* 214* 149* 145*  BUN 20 22* 23* 27*  CALCIUM 8.7* 9.0 9.2 9.6  CREATININE 1.25* 1.21 1.21 1.23  GFRNONAA 50* 52* 52* 51*  GFRAA 57* 60* 60* 59*    LIVER FUNCTION TESTS:  Recent Labs  04/05/16 1102 06/12/16 2316 08/04/16 0708 08/05/16 1023  BILITOT 0.7 0.8 0.9 1.1  AST 13 16 16  14*  ALT 10 13* 16* 13*  ALKPHOS 71 63 60 60  PROT 7.3 6.7 6.2* 6.1*  ALBUMIN 4.0 3.7 3.1* 3.1*    Assessment and Plan:  2/19 Thoracentesis with post PTX Chest tube placed 2/21 Water seal trial again last 24 hrs New very small 5% PTX this am Asymptomatic Will recheck cxr 1100 am today per Dr Miles CostainShick Pt aware and agreeable  Electronically Signed: Ralene MuskratURPIN,Daniyah Fohl A 08/13/2016, 9:15 AM   I spent a total of 15 Minutes at the the patient's bedside AND on the patient's hospital floor or unit,  greater than 50% of which was counseling/coordinating care for Rt chest tube

## 2016-08-13 NOTE — Progress Notes (Signed)
Patient ID: Kyle CairoRobert P [name], male   DOB: 1925/09/04, 83 y.o.   MRN: [id number]   Repeat CXR at 1100 today: Right chest tube is unchanged in position. No pneumothorax is noted. Persistent small bilateral pleural effusion with bilateral basilar atelectasis. Mild interstitial prominence bilaterally probable mild interstitial edema.  Pt is asymptomatic Removal per Dr Miles CostainShick  Removed chest tube at bedside Pt tolerated well vaseline gauze dressing applied  Port CXR pending post removal

## 2016-08-13 NOTE — Progress Notes (Signed)
PROGRESS NOTE        PATIENT DETAILS Name: Kyle [name] Age: 83 y.o. Sex: male Date of Birth: Oct 01, 1925 Admit Date: 08/04/2016 Admitting Physician Haydee Salter, MD ZOX:[id number] Homero Fellers, MD  Brief Narrative: Patient is a 83 y.o. male with history of atrial fibrillation, trifascicular block and pacemaker placement, HTN, hyperlipidemia, diabetes, COPD, OSA, diastolic heart failure, and pulmonary HTN. He was admitted for acute respiratory failure with hypoxia due to acute diastolic CHF and bilateral transudative pleural effusion. He was started on Lasix and underwent thoracocentesis on 2/19, unfortunately he developed a small Pneumothorax post thoracocentesis requiring chest tube placement. See below for further details.   Subjective: No major issues overnight-denies any SOB. He is persistently coughing while eating.   Assessment/Plan: Acute respiratory failure with hypoxia due to bilateral pleural effusion and acute diastolic heart failure: Better, appears more compensated, weight decreased to 164 pounds (171 pounds on admission) and > -15 L since admission. Continue Lasix.  Right pleural effusion: Likely secondary to heart failure-transudative by light's criteria. Continue diuresis-underwent thoracocentesis on 2/19.   Right-sided pneumothorax: Iatrogenic-likely due to recent thoracocentesis. Chest tube placed on 2/21, and was transitioned to water seal on 2/23. CXR on 2/27 shows right lateral pneumothorax is increased, up to 5%. Chest tube management is per IR.   Persistent cough while eating: SLP evaluation for swallowing evaluation.   Possible pneumonia: Likely has atelectasis due to effusion, no fever, had mild leukocytosis, appears nontoxic-Levaquin stopped 2/22.  Type 2 Diabetes Mellitus: CBGs stable- Continue sliding scale insulin. Plan on resuming oral medications on discharge  Dyslipidemia: Continue simvastatin.   Hypertension: Controlled-  Continue metoprolol and lisinopril.   COPD: Stable without any signs of exacerbation-continue albuterol neb.   Chronic Atrial fibrillation: Eliquis switched to heparin-continue for now till chest tube removed. Continue metoprolol. [place] vasc 2 score of at least 4   OSA. BiPAP daily at bedtime.   DVT Prophylaxis: Heparin   Code Status: DNR  Family Communication: None at bedside this am  Disposition Plan: Remain inpatient- SNF on discharge-once Chest tube has been removed.  Antimicrobial agents: Anti-infectives    Start     Dose/Rate Route Frequency Ordered Stop   08/08/16 1400  levofloxacin (LEVAQUIN) tablet 750 mg  Status:  Discontinued     750 mg Oral Every 48 hours 08/07/16 1102 08/08/16 1140   08/06/16 1400  levofloxacin (LEVAQUIN) tablet 750 mg  Status:  Discontinued     750 mg Oral Every 24 hours 08/06/16 1253 08/07/16 1102   08/05/16 0230  vancomycin (VANCOCIN) IVPB 1000 mg/200 mL premix  Status:  Discontinued     1,000 mg 200 mL/hr over 60 Minutes Intravenous Every 12 hours 08/04/16 1416 08/06/16 1205   08/04/16 1430  ceFEPIme (MAXIPIME) 1 g in dextrose 5 % 50 mL IVPB  Status:  Discontinued     1 g 100 mL/hr over 30 Minutes Intravenous Every 8 hours 08/04/16 1407 08/06/16 1205   08/04/16 1430  vancomycin (VANCOCIN) 1,750 mg in sodium chloride 0.9 % 500 mL IVPB     1,750 mg 250 mL/hr over 120 Minutes Intravenous  Once 08/04/16 1416 08/04/16 1751   08/04/16 0830  vancomycin (VANCOCIN) 1,250 mg in sodium chloride 0.9 % 250 mL IVPB     1,250 mg 166.7 mL/hr over 90 Minutes Intravenous  Once 08/04/16 0758 08/04/16 1020  08/04/16 0800  piperacillin-tazobactam (ZOSYN) IVPB 3.375 g  Status:  Discontinued     3.375 g 12.5 mL/hr over 240 Minutes Intravenous Every 8 hours 08/04/16 0741 08/04/16 0745   08/04/16 0800  piperacillin-tazobactam (ZOSYN) IVPB 3.375 g     3.375 g 100 mL/hr over 30 Minutes Intravenous  Once 08/04/16 0758 08/04/16 0850   08/04/16 0745  vancomycin  (VANCOCIN) 1,250 mg in sodium chloride 0.9 % 250 mL IVPB  Status:  Discontinued     1,250 mg 166.7 mL/hr over 90 Minutes Intravenous  Once 08/04/16 0741 08/04/16 0745      Procedures: 2/19>>Thoracocentesis 2/21>>Chest tube placement  CONSULTS:  Radiology  Time spent: 25 minutes-Greater than 50% of this time was spent in counseling, explanation of diagnosis, planning of further management, and coordination of care.  MEDICATIONS: Scheduled Meds: . albuterol  2.5 mg Nebulization TID  . doxazosin  4 mg Oral Daily  . escitalopram  10 mg Oral Daily  . furosemide  40 mg Oral BID  . insulin aspart  0-5 Units Subcutaneous QHS  . insulin aspart  0-9 Units Subcutaneous TID WC  . latanoprost  1 drop Both Eyes QHS  . lisinopril  20 mg Oral Daily  . mouth rinse  15 mL Mouth Rinse BID  . metoprolol succinate  25 mg Oral Daily  . nitroGLYCERIN  0.5 inch Topical Q6H  . polyethylene glycol  17 g Oral Daily  . simvastatin  20 mg Oral QHS   Continuous Infusions: . heparin 1,250 Units/hr (08/13/16 0838)   PRN Meds:.acetaminophen, albuterol, bisacodyl, ondansetron (ZOFRAN) IV, senna-docusate   PHYSICAL EXAM: Vital signs: Vitals:   08/13/16 0300 08/13/16 0700 08/13/16 0800 08/13/16 0806  BP: (!) 105/56     Pulse: 70 70 70   Resp: 20 (!) 23 (!) 22   Temp: 97.6 F (36.4 C)     TempSrc: Oral     SpO2: 93% 93% 96% 95%  Weight: 74.4 kg (164 lb)     Height:       Filed Weights   08/11/16 0800 08/12/16 0332 08/13/16 0300  Weight: 72.1 kg (158 lb 14.4 oz) 71.1 kg (156 lb 12.8 oz) 74.4 kg (164 lb)   Body mass index is 23.2 kg/m.   General appearance :Awake, alert, not in any distress. Speech Clear. Chronically sick appearing. Eyes:, pupils equally reactive to light and accomodation,no scleral icterus.Pink conjunctiva HEENT: Atraumatic and Normocephalic Neck: supple, no JVD. No cervical lymphadenopathy. No thyromegaly Resp:Good air entry -anteriorly CVS: S1 S2 regular, no murmurs.    GI: Bowel sounds present, Non tender and not distended with no gaurding, rigidity or rebound.No organomegaly Extremities: B/L Lower Ext shows no edema, both legs are warm to touch Neurology:  speech clear,Non focal, sensation is grossly intact. Psychiatric: Normal judgment and insight. Alert and oriented x 3. Normal mood. Musculoskeletal:No digital cyanosis Skin:No Rash, warm and dry Wounds:N/A  I have personally reviewed following labs and imaging studies  LABORATORY DATA: CBC:  Recent Labs Lab 08/09/16 0422 08/10/16 0342 08/11/16 0509 08/12/16 0309 08/13/16 0405  WBC 9.1 10.0 10.6* 12.5* 10.4  HGB 10.1* 10.4* 10.6* 10.1* 10.2*  HCT 32.3* 32.6* 33.7* 31.6* 32.4*  MCV 87.1 86.5 86.9 85.6 86.4  PLT 245 242 254 257 236    Basic Metabolic Panel:  Recent Labs Lab 08/07/16 0404 08/08/16 0231 08/09/16 0422 08/11/16 0509  NA 137 137 136 139  K 3.5 3.9 3.6 3.6  CL 93* 91* 94* 94*  CO2 36* 34* 35*  34*  GLUCOSE 128* 214* 149* 145*  BUN 20 22* 23* 27*  CREATININE 1.25* 1.21 1.21 1.23  CALCIUM 8.7* 9.0 9.2 9.6    GFR: Estimated Creatinine Clearance: 43.6 mL/min (by C-G formula based on SCr of 1.23 mg/dL).  Liver Function Tests: No results for input(s): AST, ALT, ALKPHOS, BILITOT, PROT, ALBUMIN in the last 168 hours. No results for input(s): LIPASE, AMYLASE in the last 168 hours. No results for input(s): AMMONIA in the last 168 hours.  Coagulation Profile:  Recent Labs Lab 08/07/16 1211  INR 1.33    Cardiac Enzymes: No results for input(s): CKTOTAL, CKMB, CKMBINDEX, TROPONINI in the last 168 hours.  BNP (last 3 results)  Recent Labs  04/05/16 1102  PROBNP 367.0*    HbA1C: No results for input(s): HGBA1C in the last 72 hours.  CBG:  Recent Labs Lab 08/12/16 0759 08/12/16 1136 08/12/16 1620 08/12/16 1938 08/13/16 0722  GLUCAP 164* 155* 202* 191* 154*    Lipid Profile: No results for input(s): CHOL, HDL, LDLCALC, TRIG, CHOLHDL, LDLDIRECT in  the last 72 hours.  Thyroid Function Tests: No results for input(s): TSH, T4TOTAL, FREET4, T3FREE, THYROIDAB in the last 72 hours.  Anemia Panel: No results for input(s): VITAMINB12, FOLATE, FERRITIN, TIBC, IRON, RETICCTPCT in the last 72 hours.  Urine analysis:    Component Value Date/Time   COLORURINE AMBER (A) 06/12/2016 1748   APPEARANCEUR HAZY (A) 06/12/2016 1748   LABSPEC 1.027 06/12/2016 1748   PHURINE 5.0 06/12/2016 1748   GLUCOSEU NEGATIVE 06/12/2016 1748   HGBUR NEGATIVE 06/12/2016 1748   HGBUR negative 04/05/2009 0856   BILIRUBINUR NEGATIVE 06/12/2016 1748   BILIRUBINUR n 09/20/2015 0840   KETONESUR NEGATIVE 06/12/2016 1748   PROTEINUR 100 (A) 06/12/2016 1748   UROBILINOGEN 0.2 09/20/2015 0840   UROBILINOGEN 0.2 04/05/2009 0856   NITRITE NEGATIVE 06/12/2016 1748   LEUKOCYTESUR NEGATIVE 06/12/2016 1748    Sepsis Labs: Lactic Acid, Venous    Component Value Date/Time   LATICACIDVEN 1.23 08/04/2016 1107    MICROBIOLOGY: Recent Results (from the past 240 hour(s))  Blood culture (routine x 2)     Status: None   Collection Time: 08/04/16  7:50 AM  Result Value Ref Range Status   Specimen Description BLOOD RIGHT HAND  Final   Special Requests BOTTLES DRAWN AEROBIC AND ANAEROBIC  5CC  Final   Culture NO GROWTH 5 DAYS  Final   Report Status 08/09/2016 FINAL  Final  Blood culture (routine x 2)     Status: None   Collection Time: 08/04/16  8:01 AM  Result Value Ref Range Status   Specimen Description BLOOD RIGHT ANTECUBITAL  Final   Special Requests BOTTLES DRAWN AEROBIC AND ANAEROBIC  5CC  Final   Culture NO GROWTH 5 DAYS  Final   Report Status 08/09/2016 FINAL  Final  Respiratory Panel by PCR     Status: None   Collection Time: 08/04/16  9:56 AM  Result Value Ref Range Status   Adenovirus NOT DETECTED NOT DETECTED Final   Coronavirus 229E NOT DETECTED NOT DETECTED Final   Coronavirus HKU1 NOT DETECTED NOT DETECTED Final   Coronavirus NL63 NOT DETECTED NOT  DETECTED Final   Coronavirus OC43 NOT DETECTED NOT DETECTED Final   Metapneumovirus NOT DETECTED NOT DETECTED Final   Rhinovirus / Enterovirus NOT DETECTED NOT DETECTED Final   Influenza A NOT DETECTED NOT DETECTED Final   Influenza B NOT DETECTED NOT DETECTED Final   Parainfluenza Virus 1 NOT DETECTED NOT DETECTED Final  Parainfluenza Virus 2 NOT DETECTED NOT DETECTED Final   Parainfluenza Virus 3 NOT DETECTED NOT DETECTED Final   Parainfluenza Virus 4 NOT DETECTED NOT DETECTED Final   Respiratory Syncytial Virus NOT DETECTED NOT DETECTED Final   Bordetella pertussis NOT DETECTED NOT DETECTED Final   Chlamydophila pneumoniae NOT DETECTED NOT DETECTED Final   Mycoplasma pneumoniae NOT DETECTED NOT DETECTED Final  MRSA PCR Screening     Status: None   Collection Time: 08/04/16  2:44 PM  Result Value Ref Range Status   MRSA by PCR NEGATIVE NEGATIVE Final    Comment:        The GeneXpert MRSA Assay (FDA approved for NASAL specimens only), is one component of a comprehensive MRSA colonization surveillance program. It is not intended to diagnose MRSA infection nor to guide or monitor treatment for MRSA infections.   Culture, body fluid-bottle     Status: None   Collection Time: 08/05/16  1:48 PM  Result Value Ref Range Status   Specimen Description FLUID PLEURAL RIGHT  Final   Special Requests NONE  Final   Culture NO GROWTH 5 DAYS  Final   Report Status 08/10/2016 FINAL  Final  Gram stain     Status: None   Collection Time: 08/05/16  1:48 PM  Result Value Ref Range Status   Specimen Description FLUID  Final   Special Requests NONE  Final   Gram Stain   Final    WBC PRESENT, PREDOMINANTLY PMN NO ORGANISMS SEEN CYTOSPIN SMEAR    Report Status 08/05/2016 FINAL  Final    RADIOLOGY STUDIES/RESULTS:    LOS: 9 days   Corwin Levins, PA-S  If 7PM-7AM, please contact night-coverage [email or web address] Password [facility] 08/13/2016, 9:26 AM  Attending MD note  Patient was seen,  examined,treatment plan was discussed with the PA-S.  I have personally reviewed the clinical findings, lab, imaging studies and management of this patient in detail. I agree with the documentation, as recorded by the PA-S.   Patient is Essentially unchanged-lying comfortably in bed.  On Exam: Gen. exam: Awake, alert, not in any distress Chest: Good air entry bilaterally, no rhonchi or rales CVS: S1-S2 regular, no murmurs Abdomen: Soft, nontender and nondistended Neurology: Non-focal Skin: No rash or lesions  Impression Acute diastolic heart failure-more compensated Pleural effusion: Likely secondary to above status post thoracocentesis-now with iatrogenic pneumothorax Cough-persistent while eating breakfast this morning-await SLP evaluation  Plan Continue oral Lasix IR following and managing chest tube Await SLP Back to SNF when a chest tube removed  Rest as above  Windell Norfolk MD

## 2016-08-13 NOTE — Progress Notes (Signed)
ANTICOAGULATION CONSULT NOTE - Follow Up Consult  Pharmacy Consult:  Heparin Indication: atrial fibrillation  No Known Allergies  Patient Measurements: Height: 5' 10.5" (179.1 cm) Weight: 164 lb (74.4 kg) IBW/kg (Calculated) : 74.15 Heparin Dosing Weight: 71 kg  Vital Signs: Temp: 97.6 F (36.4 C) (02/27 0300) Temp Source: Oral (02/27 0300) BP: 105/56 (02/27 0300) Pulse Rate: 70 (02/27 0800)  Labs:  Recent Labs  08/11/16 0509 08/12/16 0309 08/13/16 0405  HGB 10.6* 10.1* 10.2*  HCT 33.7* 31.6* 32.4*  PLT 254 257 236  HEPARINUNFRC 0.53 0.42 0.35  CREATININE 1.23  --   --     Estimated Creatinine Clearance: 43.6 mL/min (by C-G formula based on SCr of 1.23 mg/dL).    Assessment: 89 YOM on Eliquis PTA for history of AFib, which was placed on hold for thoracentesis on 08/05/16 and then resumed that evening.  He was switched to IV heparin on 08/06/16.  Now s/p chest tube placement for pheumothorax.  Heparin level therapeutic at 0.35 on 1250 units/hr. CBC stable. No s/sx bleeding noted.   Goal of Therapy:  Heparin level 0.3-0.7 units/ml Monitor platelets by anticoagulation protocol: Yes    Plan:  - Continue IV heparin 1250 units/hr - Monitor closely for s/sx bleeding - Heparin levels daily - Follow up restart PO AC after chest tube removal  Toys 'R' UsKimberly Tayvon Culley, Pharm.D., BCPS Clinical Pharmacist Pager [phone] 08/13/2016 10:07 AM

## 2016-08-13 NOTE — Progress Notes (Signed)
Daughter called and given update per her request. Chest tube removed.

## 2016-08-13 NOTE — Progress Notes (Signed)
Pt found with nasal cannula off.  Sat 94%.  Pt tolerating room air well at this time. RN notified.

## 2016-08-14 ENCOUNTER — Ambulatory Visit: Payer: Medicare Other | Admitting: Internal Medicine

## 2016-08-14 ENCOUNTER — Inpatient Hospital Stay (HOSPITAL_COMMUNITY): Payer: Medicare Other

## 2016-08-14 LAB — CBC
HCT: 32.4 % — ABNORMAL LOW (ref 39.0–52.0)
HCT: 33 % — ABNORMAL LOW (ref 39.0–52.0)
HEMOGLOBIN: 10.5 g/dL — AB (ref 13.0–17.0)
Hemoglobin: 10.1 g/dL — ABNORMAL LOW (ref 13.0–17.0)
MCH: 26.8 pg (ref 26.0–34.0)
MCH: 27.4 pg (ref 26.0–34.0)
MCHC: 31.2 g/dL (ref 30.0–36.0)
MCHC: 31.8 g/dL (ref 30.0–36.0)
MCV: 85.9 fL (ref 78.0–100.0)
MCV: 86.2 fL (ref 78.0–100.0)
PLATELETS: 249 10*3/uL (ref 150–400)
Platelets: 261 10*3/uL (ref 150–400)
RBC: 3.77 MIL/uL — ABNORMAL LOW (ref 4.22–5.81)
RBC: 3.83 MIL/uL — ABNORMAL LOW (ref 4.22–5.81)
RDW: 15.4 % (ref 11.5–15.5)
RDW: 15.3 % (ref 11.5–15.5)
WBC: 10 10*3/uL (ref 4.0–10.5)
WBC: 9 10*3/uL (ref 4.0–10.5)

## 2016-08-14 LAB — BASIC METABOLIC PANEL
Anion gap: 11 (ref 5–15)
BUN: 33 mg/dL — AB (ref 6–20)
CHLORIDE: 94 mmol/L — AB (ref 101–111)
CO2: 32 mmol/L (ref 22–32)
CREATININE: 1.33 mg/dL — AB (ref 0.61–1.24)
Calcium: 9.5 mg/dL (ref 8.9–10.3)
GFR calc Af Amer: 53 mL/min — ABNORMAL LOW (ref >60)
GFR calc non Af Amer: 46 mL/min — ABNORMAL LOW (ref >60)
Glucose, Bld: 239 mg/dL — ABNORMAL HIGH (ref 65–99)
Potassium: 3.9 mmol/L (ref 3.5–5.1)
SODIUM: 137 mmol/L (ref 135–145)

## 2016-08-14 LAB — BRAIN NATRIURETIC PEPTIDE: B NATRIURETIC PEPTIDE 5: 244.8 pg/mL — AB (ref 0.0–100.0)

## 2016-08-14 LAB — GLUCOSE, CAPILLARY
GLUCOSE-CAPILLARY: 190 mg/dL — AB (ref 65–99)
Glucose-Capillary: 181 mg/dL — ABNORMAL HIGH (ref 65–99)
Glucose-Capillary: 187 mg/dL — ABNORMAL HIGH (ref 65–99)
Glucose-Capillary: 175 mg/dL — ABNORMAL HIGH (ref 65–99)

## 2016-08-14 LAB — HEPARIN LEVEL (UNFRACTIONATED): Heparin Unfractionated: 0.25 IU/mL — ABNORMAL LOW (ref 0.30–0.70)

## 2016-08-14 MED ORDER — APIXABAN 2.5 MG PO TABS
2.5000 mg | ORAL_TABLET | Freq: Two times a day (BID) | ORAL | Status: DC
  Administered 2016-08-14 – 2016-08-15 (×3): 2.5 mg via ORAL
  Filled 2016-08-14 (×3): qty 1

## 2016-08-14 MED ORDER — PIPERACILLIN-TAZOBACTAM 3.375 G IVPB
3.3750 g | Freq: Three times a day (TID) | INTRAVENOUS | Status: DC
  Administered 2016-08-14 – 2016-08-15 (×3): 3.375 g via INTRAVENOUS
  Filled 2016-08-14 (×4): qty 50

## 2016-08-14 MED ORDER — VANCOMYCIN HCL IN DEXTROSE 1-5 GM/200ML-% IV SOLN
1000.0000 mg | INTRAVENOUS | Status: DC
  Administered 2016-08-14: 1000 mg via INTRAVENOUS
  Filled 2016-08-14 (×2): qty 200

## 2016-08-14 MED ORDER — FUROSEMIDE 10 MG/ML IJ SOLN: 40.0000 mg | Freq: Two times a day (BID) | INTRAMUSCULAR | Status: DC

## 2016-08-14 MED ORDER — FUROSEMIDE 10 MG/ML IJ SOLN
40.0000 mg | Freq: Two times a day (BID) | INTRAMUSCULAR | Status: DC
  Administered 2016-08-14 – 2016-08-15 (×2): 40 mg via INTRAVENOUS
  Filled 2016-08-14 (×2): qty 4

## 2016-08-14 MED ORDER — FUROSEMIDE 10 MG/ML IJ SOLN
40.0000 mg | Freq: Once | INTRAMUSCULAR | Status: AC
  Administered 2016-08-14: 40 mg via INTRAVENOUS
  Filled 2016-08-14: qty 4

## 2016-08-14 NOTE — Plan of Care (Signed)
Problem: Safety: Goal: Ability to remain free from injury will improve Outcome: Progressing Pt is on bed alarm & has yellow socks. Pt & family both educated on it.

## 2016-08-14 NOTE — Plan of Care (Signed)
Problem: Physical Regulation: Goal: Ability to maintain clinical measurements within normal limits will improve Outcome: Progressing Pt started on antibiotics today.

## 2016-08-14 NOTE — Progress Notes (Signed)
Pharmacy Antibiotic Note  Kyle [name] is a 83 y.o. male admitted on 08/04/2016 with pneumonia.  Pt initially started antibiotics (2/18 >> 2/22) and had a chest tube placed.  Chest tube removed 2/27.  Clinically, pt remains SOB.  Pharmacy has been consulted to restart antibiotics, Vancomycin and Zosyn.  Plan: Vancomycin 1 gm IV every 24 hours.  Goal trough 15-20 mcg/mL. Zosyn 3.375g IV q8h (4 hour infusion).  Height: 5' 10.5" (179.1 cm) Weight: 155 lb 6.4 oz (70.5 kg) IBW/kg (Calculated) : 74.15  Temp (24hrs), Avg:97.5 F (36.4 C), Min:97.5 F (36.4 C), Max:97.5 F (36.4 C)   Recent Labs Lab 08/08/16 0231 08/09/16 0422  08/11/16 0509 08/12/16 0309 08/13/16 0405 08/14/16 0432 08/14/16 0856  WBC 11.3* 9.1  < > 10.6* 12.5* 10.4 10.0 9.0  CREATININE 1.21 1.21  --  1.23  --   --   --  1.33*  < > = values in this interval not displayed.  Estimated Creatinine Clearance: 38.3 mL/min (by C-G formula based on SCr of 1.33 mg/dL (H)).    No Known Allergies  Antimicrobials this admission: Vanc 2/18 >>2/20, restart 2/28 >>  Cefepime 2/18 >>2/20 Levaquin PO 2/20>> 2/22 Zosyn 2/28 >>  Dose adjustments this admission:   Microbiology results: 2/18 Sputum - no sample yet 2/18 MRSA PCR - negative 2/18 respiratory panel: negative 2/18 Flu negative 2/18 HIV negative 2/18 Strep Ag negative 2/18 Blood x 2 - ng -final 2/19 right pleural fluid - ng - final 2/28 blood x 2  Thank you for allowing pharmacy to be a part of this patient's care.  Toys 'R' UsKimberly Mekiyah [name], Pharm.D., BCPS Clinical Pharmacist Pager [phone] 08/14/2016 10:34 AM

## 2016-08-14 NOTE — Progress Notes (Signed)
ANTICOAGULATION CONSULT NOTE - Follow Up Consult  Pharmacy Consult for heparin Indication: atrial fibrillation  Labs:  Recent Labs  08/12/16 0309 08/13/16 0405 08/14/16 0432  HGB 10.1* 10.2* 10.1*  HCT 31.6* 32.4* 32.4*  PLT 257 236 249  HEPARINUNFRC 0.42 0.35 0.25*    Assessment: 83yo male now subtherapeutic on heparin after several levels at goal though had been trending down.  Goal of Therapy:  Heparin level 0.3-0.7 units/ml   Plan:  Will increase heparin gtt by 2 units/kg/hr to 1400 units/hr and check level in 8hr.  Vernard GamblesVeronda Talisa Petrak, PharmD, BCPS  08/14/2016,6:20 AM

## 2016-08-14 NOTE — Progress Notes (Signed)
OT Cancellation Note  Patient Details Name: Kyle CairoRobert P Skarda MRN: [id number] DOB: Mar 22, 1926   Cancelled Treatment:    Reason Eval/Treat Not Completed: Patient declined, no reason specified. Discussed role of acute OT with pt; he is clear that at this time he does not feel like therapy would be a productive use of his time and politely declines further therapy services. Will d/c from acute OT at this time; please re-consult if needs change.   Gaye AlkenBailey A Sayvion Vigen M.S., OTR/L Pager: [phone]  08/14/2016, 3:06 PM

## 2016-08-14 NOTE — Plan of Care (Signed)
Problem: Safety: Goal: Ability to remain free from injury will improve Outcome: Progressing Patient high fall risk per RN assessment.  RN instructed patient to call and wait for staff assistance prior to getting out of bed.  Patient has made no attempts to get out of bed unassisted thus far this shift.

## 2016-08-14 NOTE — Progress Notes (Signed)
PROGRESS NOTE        PATIENT DETAILS Name: Kyle [name] Age: 83 y.o. Sex: male Date of Birth: 25-Mar-1926 Admit Date: 08/04/2016 Admitting Physician Haydee Salter, MD ZOX:[id number] Homero Fellers, MD  Brief Narrative: Patient is a 83 y.o. male with history of atrial fibrillation, trifascicular block and pacemaker placement, HTN, hyperlipidemia, diabetes, COPD, OSA, diastolic heart failure, and pulmonary HTN. He was admitted for acute respiratory failure with hypoxia due to acute diastolic CHF and bilateral transudative pleural effusion. He was started on Lasix and underwent thoracocentesis on 2/19, unfortunately he developed a small Pneumothorax post thoracocentesis requiring chest tube placement. See below for further details.   Subjective: No major issues overnight-denies any SOB. Coughing with eating is better today.   Assessment/Plan: Acute respiratory failure with hypoxia due to bilateral pleural effusion and acute diastolic heart failure: Better, appears more compensated, weight decreased to 155 pounds (171 pounds on admission) and > -15 L since admission. Continue Lasix.  Right pleural effusion: Likely secondary to heart failure-transudative by light's criteria. Continue diuresis-underwent thoracocentesis on 2/19.   Right-sided pneumothorax: Iatrogenic-likely due to recent thoracocentesis. Chest tube placed on 2/21, and was transitioned to water seal on 2/23. Chest tube was removed on 2/27 and CXR shows no further pneumothorax.   Suspect Aspiration pneumonia vs HCAP: Empirically has been started on vancomycin and Zosyn, await cultures. SLP evaluation.  Persistent cough while eating: SLP evaluation for swallowing evaluation- improving as of 2/28.   Type 2 Diabetes Mellitus: CBGs stable- Continue sliding scale insulin. Plan on resuming oral medications on discharge  Dyslipidemia: Continue simvastatin.   Hypertension: Controlled- Continue metoprolol  and lisinopril.   COPD: Stable without any signs of exacerbation-continue albuterol neb.   Chronic Atrial fibrillation: Heparin switched back to Eliquis after chest tube was removed on 2/27. Continue metoprolol. [place] vasc 2 score of at least 4   OSA. BiPAP daily at bedtime.   Goals of care/palliative care: Very frail patient with prolonged hospital stay-now with worsening dyspnea this morning. Somewhat improved after IV Lasix. Long discussion with family, they seem to be very realistic and do not want any heroic/aggressive measures. Plans are to monitor overnight, and discharge back to the neph tomorrow, if patient deteriorates, family was willing to transition to  DVT Prophylaxis: Eliquis, SCDs  Code Status: DNR  Family Communication: Spoke at length with daughter earlier this morning over the phone and subsequently at bedside  Disposition Plan: Remain inpatient- SNF on discharge.  Antimicrobial agents: Anti-infectives    Start     Dose/Rate Route Frequency Ordered Stop   08/14/16 1100  vancomycin (VANCOCIN) IVPB 1000 mg/200 mL premix     1,000 mg 200 mL/hr over 60 Minutes Intravenous Every 24 hours 08/14/16 1035     08/14/16 1100  piperacillin-tazobactam (ZOSYN) IVPB 3.375 g     3.375 g 12.5 mL/hr over 240 Minutes Intravenous Every 8 hours 08/14/16 1035     08/08/16 1400  levofloxacin (LEVAQUIN) tablet 750 mg  Status:  Discontinued     750 mg Oral Every 48 hours 08/07/16 1102 08/08/16 1140   08/06/16 1400  levofloxacin (LEVAQUIN) tablet 750 mg  Status:  Discontinued     750 mg Oral Every 24 hours 08/06/16 1253 08/07/16 1102   08/05/16 0230  vancomycin (VANCOCIN) IVPB 1000 mg/200 mL premix  Status:  Discontinued     1,000  mg 200 mL/hr over 60 Minutes Intravenous Every 12 hours 08/04/16 1416 08/06/16 1205   08/04/16 1430  ceFEPIme (MAXIPIME) 1 g in dextrose 5 % 50 mL IVPB  Status:  Discontinued     1 g 100 mL/hr over 30 Minutes Intravenous Every 8 hours 08/04/16 1407 08/06/16  1205   08/04/16 1430  vancomycin (VANCOCIN) 1,750 mg in sodium chloride 0.9 % 500 mL IVPB     1,750 mg 250 mL/hr over 120 Minutes Intravenous  Once 08/04/16 1416 08/04/16 1751   08/04/16 0830  vancomycin (VANCOCIN) 1,250 mg in sodium chloride 0.9 % 250 mL IVPB     1,250 mg 166.7 mL/hr over 90 Minutes Intravenous  Once 08/04/16 0758 08/04/16 1020   08/04/16 0800  piperacillin-tazobactam (ZOSYN) IVPB 3.375 g  Status:  Discontinued     3.375 g 12.5 mL/hr over 240 Minutes Intravenous Every 8 hours 08/04/16 0741 08/04/16 0745   08/04/16 0800  piperacillin-tazobactam (ZOSYN) IVPB 3.375 g     3.375 g 100 mL/hr over 30 Minutes Intravenous  Once 08/04/16 0758 08/04/16 0850   08/04/16 0745  vancomycin (VANCOCIN) 1,250 mg in sodium chloride 0.9 % 250 mL IVPB  Status:  Discontinued     1,250 mg 166.7 mL/hr over 90 Minutes Intravenous  Once 08/04/16 0741 08/04/16 0745      Procedures: 2/19>>Thoracocentesis 2/21>>Chest tube placement  CONSULTS:  Radiology  Time spent: 25 minutes-Greater than 50% of this time was spent in counseling, explanation of diagnosis, planning of further management, and coordination of care.  MEDICATIONS: Scheduled Meds: . albuterol  2.5 mg Nebulization TID  . apixaban  2.5 mg Oral BID  . doxazosin  4 mg Oral Daily  . escitalopram  10 mg Oral Daily  . furosemide  40 mg Oral Daily  . insulin aspart  0-5 Units Subcutaneous QHS  . insulin aspart  0-9 Units Subcutaneous TID WC  . latanoprost  1 drop Both Eyes QHS  . lisinopril  20 mg Oral Daily  . mouth rinse  15 mL Mouth Rinse BID  . metoprolol succinate  25 mg Oral Daily  . nitroGLYCERIN  0.5 inch Topical Q6H  . piperacillin-tazobactam (ZOSYN)  IV  3.375 g Intravenous Q8H  . polyethylene glycol  17 g Oral Daily  . simvastatin  20 mg Oral QHS  . vancomycin  1,000 mg Intravenous Q24H   Continuous Infusions:  PRN Meds:.acetaminophen, albuterol, bisacodyl, ondansetron (ZOFRAN) IV, senna-docusate   PHYSICAL  EXAM: Vital signs: Vitals:   08/13/16 1957 08/14/16 0500 08/14/16 0944 08/14/16 1010  BP: 139/63 117/78  (!) 120/55  Pulse: 70 70  70  Resp: 18 (!) 21    Temp: 97.5 F (36.4 C) 97.5 F (36.4 C)    TempSrc: Oral Oral    SpO2: 95% 92% 97%   Weight:  70.5 kg (155 lb 6.4 oz)    Height:       Filed Weights   08/12/16 0332 08/13/16 0300 08/14/16 0500  Weight: 71.1 kg (156 lb 12.8 oz) 74.4 kg (164 lb) 70.5 kg (155 lb 6.4 oz)   Body mass index is 21.98 kg/m.   General appearance :Awake, alert, not in any distress. Speech Clear. Chronically sick appearing. Eyes:, pupils equally reactive to light and accomodation,no scleral icterus.Pink conjunctiva HEENT: Atraumatic and Normocephalic Neck: supple, no JVD. No cervical lymphadenopathy. No thyromegaly Resp:Good air entry -anteriorly CVS: S1 S2 regular, no murmurs.  GI: Bowel sounds present, Non tender and not distended with no gaurding, rigidity or rebound.No  organomegaly Extremities: B/L Lower Ext shows no edema, both legs are warm to touch Neurology:  speech clear,Non focal, sensation is grossly intact. Psychiatric: Normal judgment and insight. Alert and oriented x 3. Normal mood. Musculoskeletal:No digital cyanosis Skin:No Rash, warm and dry Wounds:N/A  I have personally reviewed following labs and imaging studies  LABORATORY DATA: CBC:  Recent Labs Lab 08/11/16 0509 08/12/16 0309 08/13/16 0405 08/14/16 0432 08/14/16 0856  WBC 10.6* 12.5* 10.4 10.0 9.0  HGB 10.6* 10.1* 10.2* 10.1* 10.5*  HCT 33.7* 31.6* 32.4* 32.4* 33.0*  MCV 86.9 85.6 86.4 85.9 86.2  PLT 254 257 236 249 261    Basic Metabolic Panel:  Recent Labs Lab 08/08/16 0231 08/09/16 0422 08/11/16 0509 08/14/16 0856  NA 137 136 139 137  K 3.9 3.6 3.6 3.9  CL 91* 94* 94* 94*  CO2 34* 35* 34* 32  GLUCOSE 214* 149* 145* 239*  BUN 22* 23* 27* 33*  CREATININE 1.21 1.21 1.23 1.33*  CALCIUM 9.0 9.2 9.6 9.5    GFR: Estimated Creatinine Clearance: 38.3  mL/min (by C-G formula based on SCr of 1.33 mg/dL (H)).  Liver Function Tests: No results for input(s): AST, ALT, ALKPHOS, BILITOT, PROT, ALBUMIN in the last 168 hours. No results for input(s): LIPASE, AMYLASE in the last 168 hours. No results for input(s): AMMONIA in the last 168 hours.  Coagulation Profile:  Recent Labs Lab 08/07/16 1211  INR 1.33    Cardiac Enzymes: No results for input(s): CKTOTAL, CKMB, CKMBINDEX, TROPONINI in the last 168 hours.  BNP (last 3 results)  Recent Labs  04/05/16 1102  PROBNP 367.0*    HbA1C: No results for input(s): HGBA1C in the last 72 hours.  CBG:  Recent Labs Lab 08/13/16 0722 08/13/16 1202 08/13/16 1616 08/13/16 2000 08/14/16 0746  GLUCAP 154* 175* 184* 184* 181*    Lipid Profile: No results for input(s): CHOL, HDL, LDLCALC, TRIG, CHOLHDL, LDLDIRECT in the last 72 hours.  Thyroid Function Tests: No results for input(s): TSH, T4TOTAL, FREET4, T3FREE, THYROIDAB in the last 72 hours.  Anemia Panel: No results for input(s): VITAMINB12, FOLATE, FERRITIN, TIBC, IRON, RETICCTPCT in the last 72 hours.  Urine analysis:    Component Value Date/Time   COLORURINE AMBER (A) 06/12/2016 1748   APPEARANCEUR HAZY (A) 06/12/2016 1748   LABSPEC 1.027 06/12/2016 1748   PHURINE 5.0 06/12/2016 1748   GLUCOSEU NEGATIVE 06/12/2016 1748   HGBUR NEGATIVE 06/12/2016 1748   HGBUR negative 04/05/2009 0856   BILIRUBINUR NEGATIVE 06/12/2016 1748   BILIRUBINUR n 09/20/2015 0840   KETONESUR NEGATIVE 06/12/2016 1748   PROTEINUR 100 (A) 06/12/2016 1748   UROBILINOGEN 0.2 09/20/2015 0840   UROBILINOGEN 0.2 04/05/2009 0856   NITRITE NEGATIVE 06/12/2016 1748   LEUKOCYTESUR NEGATIVE 06/12/2016 1748    Sepsis Labs: Lactic Acid, Venous    Component Value Date/Time   LATICACIDVEN 1.23 08/04/2016 1107    MICROBIOLOGY: Recent Results (from the past 240 hour(s))  MRSA PCR Screening     Status: None   Collection Time: 08/04/16  2:44 PM  Result  Value Ref Range Status   MRSA by PCR NEGATIVE NEGATIVE Final    Comment:        The GeneXpert MRSA Assay (FDA approved for NASAL specimens only), is one component of a comprehensive MRSA colonization surveillance program. It is not intended to diagnose MRSA infection nor to guide or monitor treatment for MRSA infections.   Culture, body fluid-bottle     Status: None   Collection Time: 08/05/16  1:48 PM  Result Value Ref Range Status   Specimen Description FLUID PLEURAL RIGHT  Final   Special Requests NONE  Final   Culture NO GROWTH 5 DAYS  Final   Report Status 08/10/2016 FINAL  Final  Gram stain     Status: None   Collection Time: 08/05/16  1:48 PM  Result Value Ref Range Status   Specimen Description FLUID  Final   Special Requests NONE  Final   Gram Stain   Final    WBC PRESENT, PREDOMINANTLY PMN NO ORGANISMS SEEN CYTOSPIN SMEAR    Report Status 08/05/2016 FINAL  Final  Culture, blood (routine x 2)     Status: None (Preliminary result)   Collection Time: 08/14/16 10:04 AM  Result Value Ref Range Status   Specimen Description BLOOD RIGHT ANTECUBITAL  Final   Special Requests BOTTLES DRAWN AEROBIC ONLY 9CC  Final   Culture PENDING  Incomplete   Report Status PENDING  Incomplete    RADIOLOGY STUDIES/RESULTS:    LOS: 10 days   Corwin LevinsSelby Rouch, PA-S  If 7PM-7AM, please contact night-coverage [email or web address] Password [email or web address] 08/14/2016, 10:46 AM  Attending MD note  Patient was seen, examined,treatment plan was discussed with the PA-S.  I have personally reviewed the clinical findings, lab, imaging studies and management of this patient in detail. I agree with the documentation, as recorded by the PA-S.   Patient is Essentially unchanged-lying comfortably in bed.  On Exam: Gen. exam: Awake, alert, not in any distress Chest: Good air entry bilaterally, no rhonchi or rales CVS: S1-S2 regular, no murmurs Abdomen: Soft, nontender and nondistended Neurology:  Non-focal Skin: No rash or lesions  Impression Acute diastolic heart failure- Pleural effusion: Likely secondary to above status post thoracocentesis-now with iatrogenic pneumothorax HCAP  Plan Change to IV Lasix Continue Vanco/Zosyn Chest tube removed yesterday Long discussion with the patient's daughter over the phone and subsequently at bedside-hopefully he will be okay with IV diuretic therapy, however if he deteriorates family willing to transition to his care. Plans are to tentatively discharge back to SNF tomorrow, if he deteriorates-[facility]   Rest as above  Windell NorfolkS Maryjayne Kleven MD

## 2016-08-14 NOTE — Clinical Social Work Note (Signed)
CS[facility] confirmed with [facility] that they will be able to bring the patient back to the facility under hospice services if the family opts for this.   Roddie McBryant Lew Prout MSW, [place] LakeLCSW, [place], [phone]

## 2016-08-14 NOTE — Evaluation (Signed)
Clinical/Bedside Swallow Evaluation Patient Details  Name: Kyle [name] MRN: [id number] Date of Birth: 1926/03/14  Today's Date: 08/14/2016 Time: SLP Start Time (ACUTE ONLY): 1413 SLP Stop Time (ACUTE ONLY): 1433 SLP Time Calculation (min) (ACUTE ONLY): 20 min  Past Medical History:  Past Medical History:  Diagnosis Date  . Benign prostatic hypertrophy   . CHF (congestive heart failure) (HCC)   . COPD (chronic obstructive pulmonary disease) (HCC)   . Diabetes mellitus   . Elevated PSA   . Glaucoma   . Hyperlipidemia   . Hypertension   . Intermittent complete heart block (HCC)   . Pacemaker   . PAF (paroxysmal atrial fibrillation) (HCC)    Detected on pacemaker; the duration 44 seconds  . Trifascicular block    Past Surgical History:  Past Surgical History:  Procedure Laterality Date  . CATARACT EXTRACTION, BILATERAL    . PERMANENT PACEMAKER INSERTION N/A 08/24/2012   Procedure: PERMANENT PACEMAKER INSERTION;  Surgeon: Duke SalviaSteven C Klein, MD;  [facility];  Service: Cardiovascular;  Laterality: N/A;  . TONSILLECTOMY     HPI:  83 y.o.malewith history of atrial fibrillation, trifascicular block and pacemaker placement, HTN, hyperlipidemia, diabetes, COPD, OSA, diastolic heart failure, and pulmonary HTN. He was admitted for acute respiratory failure with hypoxia due to acute diastolic CHF and bilateral transudative pleural effusion.  R pneumothorax.  Persistent cough while eating, hence swallow evaluation orders.    Assessment / Plan / Recommendation Clinical Impression  Pt presents with a likely primary esophageal dysphagia c/b symptoms of occasional globus, frequent belching.  Pt's oropharyngeal swallow appears to be WNL with no overt s/s of aspiration today.  Esophageal signs are not troublesome to pt; suggested that pt inform MD should s/s begin to interfere with eating/comfort.  He agrees.  No SLP f/u is warranted.  SLP Visit Diagnosis: Dysphagia, pharyngoesophageal  phase (R13.14)    Aspiration Risk  No limitations    Diet Recommendation   continue regular diet, thin liquids  Medication Administration: Whole meds with liquid    Other  Recommendations Oral Care Recommendations: Oral care BID   Follow up Recommendations        Frequency and Duration            Prognosis        Swallow Study   General HPI: 83 y.o.malewith history of atrial fibrillation, trifascicular block and pacemaker placement, HTN, hyperlipidemia, diabetes, COPD, OSA, diastolic heart failure, and pulmonary HTN. He was admitted for acute respiratory failure with hypoxia due to acute diastolic CHF and bilateral transudative pleural effusion.  R pneumothorax.  Persistent cough while eating, hence swallow evaluation orders.  Type of Study: Bedside Swallow Evaluation Previous Swallow Assessment: no Diet Prior to this Study: Regular;Thin liquids Temperature Spikes Noted: No Respiratory Status: Nasal cannula History of Recent Intubation: No Behavior/Cognition: Alert;Cooperative;Pleasant mood Oral Cavity Assessment: Within Functional Limits Oral Care Completed by SLP: No Oral Cavity - Dentition: Dentures, top;Dentures, bottom (partials) Vision: Functional for self-feeding Self-Feeding Abilities: Able to feed self Patient Positioning: Upright in bed Baseline Vocal Quality: Normal Volitional Cough: Weak Volitional Swallow: Able to elicit    Oral/Motor/Sensory Function Overall Oral Motor/Sensory Function: Within functional limits   Ice Chips Ice chips: Within functional limits   Thin Liquid Thin Liquid: Within functional limits Presentation: Cup    Nectar Thick Nectar Thick Liquid: Not tested   Honey Thick Honey Thick Liquid: Not tested   Puree Puree: Within functional limits   Solid   GO  Solid: Within functional limits        Kyle [name] Kyle [name] 08/14/2016,2:37 PM

## 2016-08-15 DIAGNOSIS — S270XXS Traumatic pneumothorax, sequela: Secondary | ICD-10-CM

## 2016-08-15 DIAGNOSIS — Z515 Encounter for palliative care: Secondary | ICD-10-CM

## 2016-08-15 DIAGNOSIS — Z7189 Other specified counseling: Secondary | ICD-10-CM

## 2016-08-15 LAB — GLUCOSE, CAPILLARY
GLUCOSE-CAPILLARY: 216 mg/dL — AB (ref 65–99)
Glucose-Capillary: 153 mg/dL — ABNORMAL HIGH (ref 65–99)

## 2016-08-15 MED ORDER — POLYETHYLENE GLYCOL 3350 17 G PO PACK: 17.0000 g | PACK | Freq: Every day | ORAL | 0 refills | Status: AC

## 2016-08-15 MED ORDER — LORAZEPAM 2 MG/ML PO CONC: 0.5000 mg | ORAL | 0 refills | Status: AC | PRN

## 2016-08-15 MED ORDER — ALBUTEROL SULFATE (2.5 MG/3ML) 0.083% IN NEBU: 2.5000 mg | INHALATION_SOLUTION | RESPIRATORY_TRACT | 12 refills | Status: AC | PRN

## 2016-08-15 MED ORDER — ALBUTEROL SULFATE (2.5 MG/3ML) 0.083% IN NEBU: 2.5000 mg | INHALATION_SOLUTION | Freq: Three times a day (TID) | RESPIRATORY_TRACT | 12 refills | Status: AC

## 2016-08-15 MED ORDER — OLANZAPINE 5 MG PO TBDP: 5.0000 mg | ORAL_TABLET | Freq: Every day | ORAL | 0 refills | Status: AC

## 2016-08-15 MED ORDER — OLANZAPINE 5 MG PO TBDP: 5.0000 mg | ORAL_TABLET | Freq: Every day | ORAL | Status: DC

## 2016-08-15 MED ORDER — MORPHINE SULFATE (CONCENTRATE) 10 MG/0.5ML PO SOLN
5.0000 mg | ORAL | Status: DC | PRN
Start: 2016-08-15 — End: 2016-08-15
  Administered 2016-08-15: 5 mg via ORAL
  Filled 2016-08-15: qty 0.5

## 2016-08-15 MED ORDER — AMOXICILLIN-POT CLAVULANATE 875-125 MG PO TABS
1.0000 | ORAL_TABLET | Freq: Two times a day (BID) | ORAL | Status: DC
  Administered 2016-08-15: 1 via ORAL
  Filled 2016-08-15: qty 1

## 2016-08-15 MED ORDER — MORPHINE SULFATE (CONCENTRATE) 10 MG/0.5ML PO SOLN: 5.0000 mg | ORAL | 0 refills | Status: AC | PRN

## 2016-08-15 MED ORDER — LORAZEPAM 2 MG/ML PO CONC: 0.5000 mg | ORAL | Status: DC | PRN

## 2016-08-15 MED ORDER — AMOXICILLIN-POT CLAVULANATE 875-125 MG PO TABS: 1.0000 | ORAL_TABLET | Freq: Two times a day (BID) | ORAL | Status: AC

## 2016-08-15 MED ORDER — FUROSEMIDE 40 MG PO TABS: 40.0000 mg | ORAL_TABLET | Freq: Two times a day (BID) | ORAL | 3 refills | Status: AC

## 2016-08-15 MED ORDER — HALOPERIDOL LACTATE 2 MG/ML PO CONC
1.0000 mg | ORAL | Status: DC | PRN
  Filled 2016-08-15: qty 0.5

## 2016-08-15 MED ORDER — HALOPERIDOL LACTATE 2 MG/ML PO CONC: 1.0000 mg | ORAL | 0 refills | Status: AC | PRN

## 2016-08-15 NOTE — Progress Notes (Signed)
Met with patient and daughter.    Plan is back to [facility] with Hospice.  D/w SW and Dr. Sloan Leiter  Full consult note to follow.  Micheline Rough, MD [place] Team [phone]

## 2016-08-15 NOTE — Clinical Social Work Note (Signed)
Per MD patient ready to [facility]. RN, patient/family, and facility notified of patient's DC. RN given number for report. DC packet on patient's chart. Ambulance transport requested for patient. CSW signing off at this time.   Roddie McBryant Leliana Kontz MSW, [place]CSW, [place]ASA, [phone]

## 2016-08-15 NOTE — Progress Notes (Signed)
Report given to [facility] at [facility]. All questions were answered. Will call facility when [facility] arrives to pickup the pt. Sanda LingerMilam, Anaid Haney R, RN

## 2016-08-15 NOTE — [place]2 (Signed)
[place] MEDICAID FL2 LEVEL OF CARE SCREENING TOOL     IDENTIFICATION  Patient Name: Kyle [name] Birthdate: 28-Dec-1925 Sex: male Admission Date (Current Location): 08/04/2016  [facility] [place]   Facility and Address:  [facility], [address], [place], [place] [address]      Provider Number: [id number]  Attending Physician Name and Address:  Maretta BeesShanker M Ghimire, MD  Relative Name and Phone Number:       Current Level of Care: Hospital Recommended Level of Care: Skilled Nursing Facility Prior Approval Number:    Date Approved/Denied:   PASRR Number: [id number]  Discharge Plan: SNF    Current Diagnoses: Patient Active Problem List   Diagnosis Date Noted  . Acute respiratory failure with hypoxia (HCC) 08/04/2016  . Pleural effusion, right 08/04/2016  . Acute diastolic heart failure (HCC) 08/04/2016  . Pulmonary HTN 08/04/2016  . Pneumonia 06/13/2016  . Depression 10/19/2015  . Atrial fibrillation (HCC) 11/25/2012  . Syncope 08/17/2012  . Trifascicular block-RBBB+LAFB 08/17/2012  . Edema 08/17/2012  . Obstructive sleep apnea 08/17/2012  . PROSTATE SPECIFIC ANTIGEN, ELEVATED 01/10/2009  . COPD with acute exacerbation (HCC) 10/11/2008  . BENIGN PROSTATIC HYPERTROPHY 01/13/2008  . Dyslipidemia 01/07/2007  . Diabetes mellitus due to underlying condition with diabetic polyneuropathy (HCC) 12/24/2006  . GLAUCOMA 12/24/2006  . Essential hypertension 12/24/2006    Orientation RESPIRATION BLADDER Height & Weight     Self, Time, Situation, Place  O2 (4L) Patient also requires nightly bipap Continent Weight: 74.1 kg (163 lb 4.8 oz) Height:  5' 10.5" (179.1 cm)  BEHAVIORAL SYMPTOMS/MOOD NEUROLOGICAL BOWEL NUTRITION STATUS   (NONE)  (NONE) Continent Diet (Heart Healthy Carb Modified)  AMBULATORY STATUS COMMUNICATION OF NEEDS Skin   Limited Assist Verbally Other (Comment) (Left Arm Skin Tear)                        Personal Care Assistance Level of Assistance  Bathing, Feeding, Dressing Bathing Assistance: Limited assistance Feeding assistance: Independent Dressing Assistance: Limited assistance     Functional Limitations Info  Sight, Hearing, Speech Sight Info: Adequate Hearing Info: Adequate Speech Info: Adequate    SPECIAL CARE FACTORS FREQUENCY  PT (By licensed PT), OT (By licensed OT)     PT Frequency: 5/week OT Frequency: 5/week            Contractures Contractures Info: Not present    Additional Factors Info  Code Status, Allergies, Insulin Sliding Scale Code Status Info: DNR Allergies Info: NKDA   Insulin Sliding Scale Info: 3/day       Current Medications (08/15/2016):  This is the current hospital active medication list Current Facility-Administered Medications  Medication Dose Route Frequency Provider Last Rate Last Dose  . acetaminophen (TYLENOL) tablet 650 mg  650 mg Oral Q4H PRN Russella DarAllison L Ellis, NP      . albuterol (PROVENTIL) (2.5 MG/3ML) 0.083% nebulizer solution 2.5 mg  2.5 mg Nebulization TID Russella DarAllison L Ellis, NP   2.5 mg at 08/15/16 0927  . albuterol (PROVENTIL) (2.5 MG/3ML) 0.083% nebulizer solution 2.5 mg  2.5 mg Nebulization Q4H PRN Leroy SeaPrashant K Singh, MD      . amoxicillin-clavulanate (AUGMENTIN) 875-125 MG per tablet 1 tablet  1 tablet Oral Q12H Shanker Levora DredgeM Ghimire, MD      . apixaban Everlene Balls(ELIQUIS) tablet 2.5 mg  2.5 mg Oral BID Maretta BeesShanker M Ghimire, MD   2.5 mg at 08/15/16 0900  . bisacodyl (DULCOLAX) EC  tablet 5 mg  5 mg Oral Daily PRN Maretta Bees, MD   5 mg at 08/12/16 0025  . doxazosin (CARDURA) tablet 4 mg  4 mg Oral Daily Russella Dar, NP   4 mg at 08/15/16 0900  . escitalopram (LEXAPRO) tablet 10 mg  10 mg Oral Daily Russella Dar, NP   10 mg at 08/15/16 0901  . furosemide (LASIX) injection 40 mg  40 mg Intravenous Q12H Maretta Bees, MD   40 mg at 08/15/16 0502  . haloperidol (HALDOL) 2 MG/ML solution 1 mg  1 mg Oral Q4H PRN Romie Minus, MD       . insulin aspart (novoLOG) injection 0-5 Units  0-5 Units Subcutaneous QHS Russella Dar, NP   Stopped at 08/10/16 2150  . insulin aspart (novoLOG) injection 0-9 Units  0-9 Units Subcutaneous TID WC Russella Dar, NP   2 Units at 08/15/16 [phone]  . latanoprost (XALATAN) 0.005 % ophthalmic solution 1 drop  1 drop Both Eyes QHS Russella Dar, NP   1 drop at 08/14/16 2138  . lisinopril (PRINIVIL,ZESTRIL) tablet 20 mg  20 mg Oral Daily Leroy Sea, MD   20 mg at 08/15/16 0901  . LORazepam (ATIVAN) 2 MG/ML concentrated solution 0.5 mg  0.5 mg Oral Q4H PRN Romie Minus, MD      . MEDLINE mouth rinse  15 mL Mouth Rinse BID Haydee Salter, MD   15 mL at 08/14/16 2200  . metoprolol succinate (TOPROL-XL) 24 hr tablet 25 mg  25 mg Oral Daily Russella Dar, NP   25 mg at 08/15/16 0901  . morphine CONCENTRATE 10 MG/0.5ML oral solution 5 mg  5 mg Oral Q4H PRN Maretta Bees, MD      . nitroGLYCERIN (NITROGLYN) 2 % ointment 0.5 inch  0.5 inch Topical Q6H Leroy Sea, MD   0.5 inch at 08/15/16 0505  . OLANZapine zydis (ZYPREXA) disintegrating tablet 5 mg  5 mg Oral QHS Gene Freeman, MD      . ondansetron [facility]) injection 4 mg  4 mg Intravenous Q6H PRN Russella Dar, NP      . polyethylene glycol (MIRALAX / GLYCOLAX) packet 17 g  17 g Oral Daily Maretta Bees, MD   17 g at 08/15/16 0901  . senna-docusate (Senokot-S) tablet 2 tablet  2 tablet Oral QHS PRN Maretta Bees, MD      . simvastatin (ZOCOR) tablet 20 mg  20 mg Oral QHS Russella Dar, NP   20 mg at 08/14/16 2138     Discharge Medications: Please see discharge summary for a list of discharge medications.  Relevant Imaging Results:  Relevant Lab Results:   Additional Information SSN: [id number]. The patient will need hospice services ordered at [facility].   Venita Lick, LCSW

## 2016-08-15 NOTE — Discharge Summary (Signed)
PATIENT DETAILS Name: Kyle CairoRobert P Mitchelle Age: 83 y.o. Sex: male Date of Birth: 1926/01/25 MRN: [id number]. Admitting Physician: Haydee SalterPhillip M Hobbs, MD JYN:[id number],VHQIOPCP:[id number],PETER [name], MD  Admit Date: 08/04/2016 Discharge date: 08/15/2016  Recommendations for Outpatient Follow-up:  1. Follow up with PCP in 1-2 weeks 2. Goals of care mostly for comfort-being discharged with home hospice. But okay to continue gentle medical treatment 3. DO NOT RESUSCITATE   Admitted From:  SNF  Disposition: SNF   Home Health: No  Equipment/Devices: None  Discharge Condition: Guarded  CODE STATUS: DNR  Diet recommendation:  Heart Healthy / Carb Modified   Brief Summary: See H&P, Labs, Consult and Test reports for all details in brief, Patient is a 83 y.o. male with history of atrial fibrillation, trifascicular block and pacemaker placement, HTN, hyperlipidemia, diabetes, COPD, OSA, diastolic heart failure, and pulmonary HTN. He was admitted for acute respiratory failure with hypoxia due to acute diastolic CHF and bilateral transudative pleural effusion. He was started on Lasix and underwent thoracocentesis on 2/19, unfortunately he developed a small Pneumothorax post thoracocentesis requiring chest tube placement. See below for further details.   Brief Hospital Course: Acute respiratory failure with hypoxia due to bilateral pleural effusion and acute diastolic heart failure: Better, appears more compensated,  -14 L since admission. Continue Lasix discharge  Right pleural effusion: Likely secondary to heart failure-transudative by light's criteria. Continue diuresis-underwent thoracocentesis on 2/19.   Right-sided pneumothorax: Iatrogenic-likely due to recent thoracocentesis. Chest tube placed on 2/21, and was transitioned to water seal on 2/23. Chest tube was removed on 2/27 and CXR shows no further pneumothorax.   Suspect Aspiration pneumonia vs HCAP:  continues to have some amount of shortness  of breath, empirically started on  vancomycin and Zosyn-family does not desire any aggressive care-anxious for patient to go back to SNF-we will transition to Augmentin for a few more days. SLP evaluation completed, plans are to continue with his regular diet. Note-goals are mostly for comfort, with gentle medical treatment. Being discharged back to SNF with hospice care.  Type 2 Diabetes Mellitus: CBGs stable- Continue sliding scale insulin. Plan on resuming oral medications on discharge  Dyslipidemia: Continue simvastatin.   Hypertension: Controlled- Continue metoprolol and lisinopril.   COPD: Stable without any signs of exacerbation-continue albuterol neb.   Chronic Atrial fibrillation: Heparin switched back to Eliquis after chest tube was removed on 2/27. Continue metoprolol. [place]asc 2 score of at least 4. Plans to continue with Eliquis-this can be subsequently discontinued if he deteriorates further  OSA. BiPAP daily at bedtime.   Generalized weakness/physical deconditioning: Very frail and weak-has persistently refused to work with physical therapy/occupational therapy. Poor overall prognoses, being discharged back to SNF with hospice care.  Goals of care/palliative care: Very frail patient with prolonged hospital course-initially admitted for acute decompensated diastolic heart failure, found to have pleural effusion and underwent thoracocentesis. Unfortunately developed a small pneumothorax requiring chest tube placement. He continues to have issues with shortness of breath, suspected aspiration pneumonia in at times appears to be infused. DO NOT RESUSCITATE in place. On discussion with family, they do not desire any heroic/aggressive measures, they have had a long discussion with palliative care today-plans are to discharge back to SNF with hospice care. Okay to continue with gentle medical treatment only, but no aggressive/heroic care as outlined  above.  Procedures/Studies: 2/19>>Thoracocentesis 2/21>>Chest tube placement  Discharge Diagnoses:  Principal Problem:   Acute respiratory failure with hypoxia (HCC) Active Problems:   Diabetes mellitus due to  underlying condition with diabetic polyneuropathy (HCC)   Dyslipidemia   Essential hypertension   COPD with acute exacerbation (HCC)   Obstructive sleep apnea   Atrial fibrillation (HCC)   Pleural effusion, right   Acute diastolic heart failure (HCC)   Pulmonary HTN   Discharge Instructions:  Activity:  As tolerated with Full fall precautions use walker/cane & assistance as needed  Discharge Instructions    (HEART FAILURE PATIENTS) Call MD:  Anytime you have any of the following symptoms: 1) 3 pound weight gain in 24 hours or 5 pounds in 1 week 2) shortness of breath, with or without a dry hacking cough 3) swelling in the hands, feet or stomach 4) if you have to sleep on extra pillows at night in order to breathe.    Complete by:  As directed    Call MD for:  difficulty breathing, headache or visual disturbances    Complete by:  As directed    Diet - low sodium heart healthy    Complete by:  As directed    Diet Carb Modified    Complete by:  As directed    Increase activity slowly    Complete by:  As directed      Allergies as of 08/15/2016   No Known Allergies     Medication List    STOP taking these medications   albuterol 90 MCG/ACT inhaler Commonly known as:  PROVENTIL,VENTOLIN   doxycycline 100 MG capsule Commonly known as:  VIBRAMYCIN     TAKE these medications   albuterol (2.5 MG/3ML) 0.083% nebulizer solution Commonly known as:  PROVENTIL Take 3 mLs (2.5 mg total) by nebulization 3 (three) times daily. What changed:  Another medication with the same name was added. Make sure you understand how and when to take each.   albuterol (2.5 MG/3ML) 0.083% nebulizer solution Commonly known as:  PROVENTIL Take 3 mLs (2.5 mg total) by nebulization every 4  (four) hours as needed for wheezing or shortness of breath. What changed:  You were already taking a medication with the same name, and this prescription was added. Make sure you understand how and when to take each.   amoxicillin-clavulanate 875-125 MG tablet Commonly known as:  AUGMENTIN Take 1 tablet by mouth every 12 (twelve) hours. For 5 more days from 3/1   apixaban 2.5 MG Tabs tablet Commonly known as:  ELIQUIS Take 1 tablet (2.5 mg total) by mouth 2 (two) times daily.   doxazosin 4 MG tablet Commonly known as:  CARDURA TAKE 1 TABLET EACH DAY.   escitalopram 10 MG tablet Commonly known as:  LEXAPRO Take 10 mg by mouth daily.   FLOVENT DISKUS 100 MCG/BLIST Aepb Generic drug:  Fluticasone Propionate (Inhal) Inhale 1 puff into the lungs 2 (two) times daily.   furosemide 40 MG tablet Commonly known as:  LASIX Take 1 tablet (40 mg total) by mouth 2 (two) times daily. 1 half tablet daily as needed for fluid control What changed:  how much to take  how to take this  when to take this   glucose blood test strip Commonly known as:  PRECISION XTRA TEST STRIPS 1 each by Other route daily as needed for other. Use as instructed   haloperidol 2 MG/ML solution Commonly known as:  HALDOL Take 0.5 mLs (1 mg total) by mouth every 4 (four) hours as needed for agitation (or nausea).   latanoprost 0.005 % ophthalmic solution Commonly known as:  XALATAN 1 drop at bedtime.  lisinopril 40 MG tablet Commonly known as:  PRINIVIL,ZESTRIL TAKE 1 TABLET ONCE DAILY.   LORazepam 2 MG/ML concentrated solution Commonly known as:  ATIVAN Take 0.3 mLs (0.6 mg total) by mouth every 4 (four) hours as needed for anxiety.   metFORMIN 500 MG 24 hr tablet Commonly known as:  GLUCOPHAGE-XR Take 1,500 mg by mouth daily with breakfast.   metoprolol succinate 50 MG 24 hr tablet Commonly known as:  TOPROL-XL Take 1/2 tablet by mouth daily (25mg )   morphine CONCENTRATE 10 MG/0.5ML Soln  concentrated solution Take 0.25 mLs (5 mg total) by mouth every 4 (four) hours as needed for anxiety or shortness of breath.   OLANZapine zydis 5 MG disintegrating tablet Commonly known as:  ZYPREXA Take 1 tablet (5 mg total) by mouth at bedtime.   polyethylene glycol packet Commonly known as:  MIRALAX / GLYCOLAX Take 17 g by mouth daily. Start taking on:  08/16/2016      Follow-up Information    Rogelia Boga, MD. Schedule an appointment as soon as possible for a visit.   Specialty:  Internal Medicine Why:  As needed Contact information: [address] Jencarl[place] Nicolson [facility] [place] [address] [phone]          No Known Allergies   Consultations:   cardiology and Palliative, IR  Other Procedures/Studies: Dg Chest 2 View  Result Date: 08/05/2016 CLINICAL DATA:  Acute respiratory failure with hypoxia. EXAM: CHEST  2 VIEW COMPARISON:  Thoracentesis from the same day. FINDINGS: Heart size  the the heart is enlarged. There is significant reduction in the right pleural effusion. A small pneumothorax is present on the right at approximately 20%. Left basilar airspace disease and moderate bilateral pulmonary vascular congestion is stable. A left subclavian pacemaker is in place. Atherosclerotic changes are present at the aorta. IMPRESSION: 1. Small right-sided pneumothorax measuring 03-2019 percent following thoracentesis. 2. Marked reduction in the right pleural effusion. 3. Moderate pulmonary vascular congestion. 4. Left basilar airspace disease likely reflects atelectasis. Critical Value/emergent results were called by telephone at the time of interpretation on 08/05/2016 at 3:50 pm to Dr. Richarda Overlie, who verbally acknowledged these results. Electronically Signed   By: Marin Roberts M.D.   On: 08/05/2016 15:55   Ct Chest Wo Contrast  Result Date: 08/14/2016 CLINICAL DATA:  Shortness of breath, follow-up pneumonia EXAM: CT CHEST WITHOUT CONTRAST TECHNIQUE: Multidetector CT  imaging of the chest was performed following the standard protocol without IV contrast. COMPARISON:  08/04/2016 FINDINGS: Cardiovascular: Cardiomegaly again noted. Dual lead cardiac pacemaker leads are unchanged in position. Extensive atherosclerotic calcifications of thoracic aorta and coronary arteries again noted. Mediastinum/Nodes: No mediastinal or hilar adenopathy is noted on this unenhanced scan. A precarinal lymph node Measures 8 mm short-axis not pathologic by size criteria. Lungs/Pleura: Persistent small to moderate right pleural effusion. Small left pleural effusion. Persistent probable atelectasis with minimal air bronchogram bilateral lower lobe posteriorly. Persistent some atelectasis in right middle lobe with slight improvement from prior exam. There is improvement in aeration in inferior aspect of the lingula and anterior/ lateral in left lower lobe without definite residual pneumonia. No pulmonary edema. There is peripheral mild interstitial prominence in right upper lobe and mild thickening of peripheral interlobular septa. Most likely due to mild peripheral edema or fibrotic changes. Mild thickening of peripheral left major fissure probable small fluid along the fissure. Central airways are patent. Upper Abdomen: The visualized upper abdomen shows no adrenal gland mass. Unenhanced liver pancreas and tail of the pancreas is unremarkable.  Atherosclerotic calcifications of abdominal aorta. Musculoskeletal: No destructive bony lesions are noted. Sagittal images of the spine shows diffuse osteopenia. Stable significant compression deformity of T8 vertebral body. Stable compression deformity upper endplate of T10 vertebral body. IMPRESSION: 1. Persistent small to moderate right pleural effusion. Small left pleural effusion. Persistent probable atelectasis with minimal air bronchogram bilateral lower lobe posteriorly. Persistent some atelectasis in right middle lobe with slight improvement from prior  exam. There is improvement in aeration in inferior aspect of the lingula and anterior/ lateral in left lower lobe without definite residual pneumonia. No pulmonary edema. There is peripheral mild interstitial prominence in right upper lobe and mild thickening of peripheral interlobular septa. Most likely due to mild peripheral edema or fibrotic changes. 2. Cardiomegaly again noted. Extensive atherosclerotic calcifications of abdominal aorta and coronary arteries. Stable dual lead cardiac pacemaker position. 3. Patent central airways.  No mediastinal or hilar adenopathy. 4. Stable compression deformity T8 and T10 vertebral bodies. Electronically Signed   By: Natasha Mead M.D.   On: 08/14/2016 11:37   Ct Chest Wo Contrast  Result Date: 08/04/2016 CLINICAL DATA:  83 year old male with history of congestive heart failure. Large right pleural effusion. EXAM: CT CHEST WITHOUT CONTRAST TECHNIQUE: Multidetector CT imaging of the chest was performed following the standard protocol without IV contrast. COMPARISON:  No priors. FINDINGS: Cardiovascular: Heart size is mildly enlarged. Small amount of pericardial fluid and/or thickening, unlikely to be of hemodynamic significance at this time. There is aortic atherosclerosis, as well as atherosclerosis of the great vessels of the mediastinum and the coronary arteries, including calcified atherosclerotic plaque in the left main, left anterior descending, left circumflex and right coronary arteries. Mild calcifications of the aortic valve. Mild calcifications of the mitral annulus. Left-sided pacemaker device with lead tips terminating in the right atrial appendage and right ventricular apex. Mediastinum/Nodes: No pathologically enlarged mediastinal or hilar lymph nodes. Please note that accurate exclusion of hilar adenopathy is limited on noncontrast CT scans. Esophagus is unremarkable in appearance. No axillary lymphadenopathy. Lungs/Pleura: Large right and moderate left-sided  pleural effusions. This is associated with what appears to be passive atelectasis in the lungs. Specifically, the entire right lower lobe and right middle lobe are collapsed, and much of the dependent portions of the left lower lobe also appear collapse. There does appear to be some patchy airspace consolidation, most evident in the periphery of the inferior segment of the lingula and in the remaining aerated portions of the left lower lobe where there are some air bronchograms, indicative of underlying pneumonia. Retained secretions are seen filling the bronchus intermedius largely occluding the bronchi to the right middle and lower lobes. Upper Abdomen: Aortic atherosclerosis. Trace volume of perihepatic ascites. Musculoskeletal: Old vertebral body compression fracture of T8 with approximately 80% loss of anterior vertebral body height. Old vertebral body compression fracture of T10 with approximately 10% loss of anterior vertebral body height. There are no aggressive appearing lytic or blastic lesions noted in the visualized portions of the skeleton. IMPRESSION: 1. Areas of airspace consolidation in the left lower lobe and inferior segment of the lingula, concerning for multilobar pneumonia. 2. Large right and moderate left-sided pleural effusions with associated extensive atelectasis in the entire right lower lobe and right middle lobe, and in the dependent portions of the left lower lobe. 3. Aortic atherosclerosis, in addition to left main and 3 vessel coronary artery disease. 4. There are calcifications of the aortic valve and mitral annulus. Echocardiographic correlation for evaluation of potential valvular  dysfunction may be warranted if clinically indicated. 5. Trace amount of pericardial fluid and/or thickening, unlikely to be of any hemodynamic significance at this time. 6. Trace volume of perihepatic ascites. 7. Additional incidental findings, as above. Electronically Signed   By: Trudie Reed M.D.    On: 08/04/2016 12:24   Dg Chest Port 1 View  Result Date: 08/14/2016 CLINICAL DATA:  Shortness of Breath EXAM: PORTABLE CHEST 1 VIEW COMPARISON:  08/13/2016 FINDINGS: Cardiac shadow remains enlarged. Pacing device is again seen and stable. Right-sided pleural effusion is noted which is increased in the interval from the prior exam. Right basilar atelectasis/infiltrate is noted as well. The left lung remains clear. No recurrent pneumothorax is seen. IMPRESSION: Slight increase in right-sided pleural effusion with basilar atelectasis/ infiltrate. No pneumothorax is noted. Electronically Signed   By: Alcide Clever M.D.   On: 08/14/2016 09:11   Dg Chest Port 1 View  Result Date: 08/13/2016 CLINICAL DATA:  Right chest tube removal.  Pneumothorax. EXAM: PORTABLE CHEST 1 VIEW COMPARISON:  08/13/2016 FINDINGS: Left pacer remains in place, unchanged. Interval removal of right chest tube. No visible pneumothorax. Cardiomegaly. Probable bilateral pleural effusions. Vascular congestion with diffuse right lung airspace disease and left basilar opacity, unchanged. IMPRESSION: No pneumothorax following right chest tube removal. Electronically Signed   By: Charlett Nose M.D.   On: 08/13/2016 14:56   Dg Chest Port 1 View  Result Date: 08/13/2016 CLINICAL DATA:  Follow-up right pneumothorax EXAM: PORTABLE CHEST 1 VIEW COMPARISON:  08/13/2016 FINDINGS: Cardiomediastinal silhouette is stable. Right chest tube is unchanged in position. No pneumothorax is noted. Persistent small bilateral pleural effusion with bilateral basilar atelectasis. Mild interstitial prominence bilaterally probable mild interstitial edema. IMPRESSION: Right chest tube is unchanged in position. No pneumothorax is noted. Persistent small bilateral pleural effusion with bilateral basilar atelectasis. Mild interstitial prominence bilaterally probable mild interstitial edema. Electronically Signed   By: Natasha Mead M.D.   On: 08/13/2016 11:23   Dg Chest  Port 1 View  Result Date: 08/13/2016 CLINICAL DATA:  Continued surveillance pneumothorax. EXAM: PORTABLE CHEST 1 VIEW COMPARISON:  08/12/2016. FINDINGS: RIGHT lateral pneumothorax is increased, up to 5%. RIGHT chest tube unchanged. Cardiomegaly. Mild basilar opacities, likely representing edema, atelectasis, possible infiltrate. BILATERAL effusions. Unchanged pacemaker. Thoracic atherosclerosis. IMPRESSION: Mild increase RIGHT pneumothorax, up to 5%. Otherwise little change aeration. Electronically Signed   By: Elsie Stain M.D.   On: 08/13/2016 07:22   Dg Chest Port 1 View  Result Date: 08/11/2016 CLINICAL DATA:  Pneumothorax. EXAM: PORTABLE CHEST 1 VIEW COMPARISON:  One-view chest x-ray 08/10/2016. FINDINGS: The heart is enlarged. Mild edema is stable. Pacing wires are stable. A right-sided chest tube remains in place. The previously seen pneumothorax is no longer present. Right hemidiaphragm is elevated. Asymmetric right lower lobe airspace disease is present. A right effusion is again noted. IMPRESSION: 1. Interval resolution of pneumothorax. Chest tube is stable in position. 2. Persistent asymmetric right lower lobe airspace disease and effusion. Electronically Signed   By: Marin Roberts M.D.   On: 08/11/2016 09:11   Dg Chest Port 1 View  Result Date: 08/10/2016 CLINICAL DATA:  Pneumothorax. EXAM: PORTABLE CHEST 1 VIEW COMPARISON:  One-view chest x-ray 08/09/2016 FINDINGS: The heart is enlarged. Left subclavian pacemaker wires are in place. A right pleural effusion and airspace disease is similar the prior exam. A small right-sided pneumothorax has increased. Right-sided chest tube remains in place. There is no left-sided pneumothorax. A small left pleural effusion is stable. IMPRESSION: 1.  Re- emergence of small right-sided pneumothorax of 5-10% despite chest tube in place. 2. Large right pleural effusion and airspace disease on the right persists. Electronically Signed   By: Marin Roberts M.D.   On: 08/10/2016 09:03   Dg Chest Port 1 View  Result Date: 08/09/2016 CLINICAL DATA:  83 y/o male with multilobar pneumonia and bilateral pleural effusions. Right pneumothorax following thoracentesis. Initial encounter. EXAM: PORTABLE CHEST 1 VIEW COMPARISON:  08/08/2016 and earlier. FINDINGS: Portable AP semi upright view at 0744 hours. Small caliber right chest tube remains in place. No residual right pneumothorax. Increased peripheral right lung opacity which may reflect loculated pleural fluid. Continued patchy and confluent right lower lung opacity. Continued confluent retrocardiac opacity. Stable cardiomegaly and mediastinal contours. Calcified aortic atherosclerosis. Stable left chest stool lead cardiac pacemaker. Visualized tracheal air column is within normal limits. IMPRESSION: 1. Stable right chest tube.  No residual pneumothorax. 2. Suspect some reaccumulation of partially loculated right pleural fluid. 3. Continued patchy right lung and dense retrocardiac opacity compatible with pneumonia. Electronically Signed   By: Odessa Fleming M.D.   On: 08/09/2016 08:03   Dg Chest Port 1 View  Result Date: 08/08/2016 CLINICAL DATA:  Chest tube placement for pneumothorax EXAM: PORTABLE CHEST 1 VIEW COMPARISON:  August 07, 2016 FINDINGS: A chest tube has been placed on the right. The right pneumothorax has largely resolved with only a minimal apical pneumothorax currently evident. No tension component. There is widespread interstitial edema with patchy airspace consolidation in both mid and lower lung zones. There are small pleural effusions bilaterally. There is cardiomegaly. The pulmonary vascularity is within normal limits. Pacemaker leads are attached to the right atrium and right ventricle. There is atherosclerotic calcification in the aorta. No bone lesions. IMPRESSION: Only minimal pneumothorax on the right following chest tube placement. There is interstitial and alveolar edema bilaterally  in a pattern suggesting congestive heart failure. There may be some re-expansion phenomenon on the right as well. There is questionable superimposed pneumonia in the bases, particular in the left lower lobe. Stable cardiomegaly. There is aortic atherosclerosis. Electronically Signed   By: Bretta Bang III M.D.   On: 08/08/2016 09:03   Dg Chest Port 1 View  Result Date: 08/07/2016 CLINICAL DATA:  Shortness of breath.  Known right-sided pneumothorax EXAM: PORTABLE CHEST 1 VIEW COMPARISON:  August 06, 2016 FINDINGS: There is a pneumothorax on the right which appears overall slightly larger. No tension component. There is a airspace consolidation on the right with moderate right pleural effusion. Left lung is hyperexpanded. Atelectatic change in the left lower lobe is stable. There is cardiomegaly, stable. Pacemaker leads are attached to the right atrium and right ventricle. There is aortic atherosclerosis. There is degenerative change in each shoulder. IMPRESSION: The known right-sided pneumothorax is slightly larger but without tension component. There is persistent effusion on the right as well as consolidation throughout much of the aerated right lung. There is atelectatic change in the left base, stable. Lungs elsewhere clear. Stable cardiac enlargement. There is aortic atherosclerosis. These results will be called to the ordering clinician or representative by the Radiologist Assistant, and communication documented in the PACS or zVision Dashboard. Electronically Signed   By: Bretta Bang III M.D.   On: 08/07/2016 07:47   Dg Chest Port 1 View  Result Date: 08/06/2016 CLINICAL DATA:  Shortness of breath.  Pneumothorax . EXAM: PORTABLE CHEST 1 VIEW COMPARISON:  08/05/2016 . FINDINGS: Cardiac pacer stable position. Stable cardiomegaly. Increasing interstitial prominence  consistent with CHF. Small bilateral pleural effusions. Right-sided pneumothorax is increased slightly from prior exam.  IMPRESSION: 1. Known right-sided pneumothorax has increased slightly from prior exam. 2. Cardiac pacer stable position. Congestive heart failure with slight increase in pulmonary interstitial edema. Bilateral small pleural effusions also noted. Electronically Signed   By: Maisie Fus  Register   On: 08/06/2016 07:07   Dg Chest Port 1 View  Result Date: 08/04/2016 CLINICAL DATA:  Severe shortness of breath since this morning, fever, hypertension, diabetes mellitus, COPD, paroxysmal atrial fibrillation EXAM: PORTABLE CHEST 1 VIEW COMPARISON:  Portable exam 0726 hours compared to 06/12/2016 FINDINGS: LEFT subclavian sequential pacemaker with leads projecting over RIGHT atrium and RIGHT ventricle unchanged. Enlargement of cardiac silhouette with pulmonary vascular congestion. Atherosclerotic calcification aorta. Moderate to large RIGHT pleural effusion with basilar atelectasis. BILATERAL interstitial infiltrates slightly increased question pulmonary edema though infection in RIGHT lung not excluded. No pneumothorax. Bones demineralized. IMPRESSION: Enlargement of cardiac silhouette post pacemaker with pulmonary vascular congestion and BILATERAL pulmonary infiltrates favor pulmonary edema as above. Moderate to large RIGHT pleural effusion. Aortic atherosclerosis. Electronically Signed   By: Ulyses Southward M.D.   On: 08/04/2016 07:37   Dg Chest Port 1v Same Day  Result Date: 08/12/2016 CLINICAL DATA:  Shortness of breath. EXAM: PORTABLE CHEST 1 VIEW COMPARISON:  08/11/2016 . FINDINGS: Right chest tube in stable position. No pneumothorax. Cardiac pacer with lead tips in right atrium and right ventricle cardiomegaly with diffuse bilateral pulmonary interstitial prominence and bilateral pleural effusions consistent with congestive heart failure. Slight worsening from prior exam . IMPRESSION: 1. Right chest tube in stable position.  No pneumothorax. 2. Cardiac pacer in stable position. Cardiomegaly with slight worsening of  pulmonary interstitial edema and bilateral pleural effusions. Electronically Signed   By: Maisie Fus  Register   On: 08/12/2016 07:30   Ct Image Guided Drainage By Percutaneous Catheter  Result Date: 08/07/2016 INDICATION: Respiratory insufficiency. Enlarging right hydropneumothorax post thoracentesis. EXAM: CT IMAGE GUIDED RIGHT CHEST DRAINAGE BY PERCUTANEOUS CATHETER MEDICATIONS: The patient is currently admitted to the hospital and receiving intravenous antibiotics. The antibiotics were administered within an appropriate time frame prior to the initiation of the procedure. ANESTHESIA/SEDATION: Intravenous Fentanyl and Versed were administered as conscious sedation during continuous monitoring of the patient's level of consciousness and physiological / cardiorespiratory status by the radiology RN, with a total moderate sedation time of 22 minutes. COMPLICATIONS: None immediate. PROCEDURE: Informed written consent was obtained from the patient after a thorough discussion of the procedural risks, benefits and alternatives. All questions were addressed. Maximal Sterile Barrier Technique was utilized including caps, mask, sterile gowns, sterile gloves, sterile drape, hand hygiene and skin antiseptic. A timeout was performed prior to the initiation of the procedure. Select axial scans through the thorax were obtained. An appropriate skin entry site was determined and marked. Site was prepped and draped in usual sterile fashion. Maximal barrier sterile technique was utilized including mask, sterile gloves, sterile drape, hand hygiene and skin antiseptic. Under CT fluoroscopic guidance, a 19 gauge percutaneous entry needle was advanced into the right pleural space. Gas could be aspirated. An Amplatz guidewire advanced easily, position confirmed on CT. Tract dilated to facilitate placement of a 16 French Thal drain catheter, directed towards the apex anteriorly. CT confirmed appropriate catheter position. Catheter secured  externally with 0 Prolene suture and placed to a Pleur-Evac. The patient tolerated the procedure well. IMPRESSION: 1. Technically successful 16 French right chest tube placement for hydropneumothorax. Electronically Signed   By: Algis Downs  Deanne Coffer M.D.   On: 08/07/2016 17:42   US Thoracentesis Asp Pleural Space W/img Guide  Result Date: 08/05/2016 INDICATION: Acute Respiratory failure with hypoxia. Right pleural effusion. Request for diagnostic and therapeutic thoracentesis. EXAM: ULTRASOUND GUIDED RIGHT THORACENTESIS MEDICATIONS: 1% Lidocaine = 10 mL. COMPLICATIONS: None immediate. PROCEDURE: An ultrasound guided thoracentesis was thoroughly discussed with the patient and questions answered. The benefits, risks, alternatives and complications were also discussed. The patient understands and wishes to proceed with the procedure. Written consent was obtained. Ultrasound was performed to localize and mark an adequate pocket of fluid in the right chest. The area was then prepped and draped in the normal sterile fashion. 1% Lidocaine was used for local anesthesia. Under ultrasound guidance a 6 Fr Safe-T-Centesis catheter was introduced. Thoracentesis was performed. The catheter was removed and a dressing applied. FINDINGS: A total of approximately 1.7 liters of clear yellow fluid was removed. Samples were sent to the laboratory as requested by the clinical team. IMPRESSION: Successful ultrasound guided right thoracentesis yielding 1.7 liters of pleural fluid. Read by:  Corrin Parker, PA-C Electronically Signed   By: Richarda Overlie M.D.   On: 08/05/2016 14:09      TODAY-DAY OF DISCHARGE:  Subjective:   Leonides Grills today has no headache,no chest abdominal pain,no new weakness tingling or numbness  Objective:   Blood pressure 113/62, pulse 70, temperature 97.1 F (36.2 C), temperature source Oral, resp. rate (!) 25, height 5' 10.5" (1.791 m), weight 74.1 kg (163 lb 4.8 oz), SpO2 97 %.  Intake/Output Summary (Last  24 hours) at 08/15/16 1133 Last data filed at 08/15/16 0900  Gross per 24 hour  Intake             1190 ml  Output              826 ml  Net              364 ml   Filed Weights   08/13/16 0300 08/14/16 0500 08/14/16 2300  Weight: 74.4 kg (164 lb) 70.5 kg (155 lb 6.4 oz) 74.1 kg (163 lb 4.8 oz)    Exam: Awake Alert,No new F.N deficits [place].AT,PERRAL Supple Neck,No JVD, No cervical lymphadenopathy appriciated.  Symmetrical Chest wall movement,Decreased air entry on the right-but mostly clear to auscultation anteriorly. RRR,No Gallops,Rubs or new Murmurs, No Parasternal Heave +ve B.Sounds, Abd Soft, Non tender, No organomegaly appriciated, No rebound -guarding or rigidity. No Cyanosis, Clubbing or edema, No new Rash or bruise   PERTINENT RADIOLOGIC STUDIES: Dg Chest 2 View  Result Date: 08/05/2016 CLINICAL DATA:  Acute respiratory failure with hypoxia. EXAM: CHEST  2 VIEW COMPARISON:  Thoracentesis from the same day. FINDINGS:  heart is enlarged. There is significant reduction in the right pleural effusion. A small pneumothorax is present on the right at approximately 20%. Left basilar airspace disease and moderate bilateral pulmonary vascular congestion is stable. A left subclavian pacemaker is in place. Atherosclerotic changes are present at the aorta. IMPRESSION: 1. Small right-sided pneumothorax measuring 03-2019 percent following thoracentesis. 2. Marked reduction in the right pleural effusion. 3. Moderate pulmonary vascular congestion. 4. Left basilar airspace disease likely reflects atelectasis. Critical Value/emergent results were called by telephone at the time of interpretation on 08/05/2016 at 3:50 pm to Dr. Richarda Overlie, who verbally acknowledged these results. Electronically Signed   By: Marin Roberts M.D.   On: 08/05/2016 15:55   Ct Chest Wo Contrast  Result Date: 08/14/2016 CLINICAL DATA:  Shortness of breath, follow-up pneumonia EXAM:  CT CHEST WITHOUT CONTRAST TECHNIQUE:  Multidetector CT imaging of the chest was performed following the standard protocol without IV contrast. COMPARISON:  08/04/2016 FINDINGS: Cardiovascular: Cardiomegaly again noted. Dual lead cardiac pacemaker leads are unchanged in position. Extensive atherosclerotic calcifications of thoracic aorta and coronary arteries again noted. Mediastinum/Nodes: No mediastinal or hilar adenopathy is noted on this unenhanced scan. A precarinal lymph node Measures 8 mm short-axis not pathologic by size criteria. Lungs/Pleura: Persistent small to moderate right pleural effusion. Small left pleural effusion. Persistent probable atelectasis with minimal air bronchogram bilateral lower lobe posteriorly. Persistent some atelectasis in right middle lobe with slight improvement from prior exam. There is improvement in aeration in inferior aspect of the lingula and anterior/ lateral in left lower lobe without definite residual pneumonia. No pulmonary edema. There is peripheral mild interstitial prominence in right upper lobe and mild thickening of peripheral interlobular septa. Most likely due to mild peripheral edema or fibrotic changes. Mild thickening of peripheral left major fissure probable small fluid along the fissure. Central airways are patent. Upper Abdomen: The visualized upper abdomen shows no adrenal gland mass. Unenhanced liver pancreas and tail of the pancreas is unremarkable. Atherosclerotic calcifications of abdominal aorta. Musculoskeletal: No destructive bony lesions are noted. Sagittal images of the spine shows diffuse osteopenia. Stable significant compression deformity of T8 vertebral body. Stable compression deformity upper endplate of T10 vertebral body. IMPRESSION: 1. Persistent small to moderate right pleural effusion. Small left pleural effusion. Persistent probable atelectasis with minimal air bronchogram bilateral lower lobe posteriorly. Persistent some atelectasis in right middle lobe with slight  improvement from prior exam. There is improvement in aeration in inferior aspect of the lingula and anterior/ lateral in left lower lobe without definite residual pneumonia. No pulmonary edema. There is peripheral mild interstitial prominence in right upper lobe and mild thickening of peripheral interlobular septa. Most likely due to mild peripheral edema or fibrotic changes. 2. Cardiomegaly again noted. Extensive atherosclerotic calcifications of abdominal aorta and coronary arteries. Stable dual lead cardiac pacemaker position. 3. Patent central airways.  No mediastinal or hilar adenopathy. 4. Stable compression deformity T8 and T10 vertebral bodies. Electronically Signed   By: Natasha Mead M.D.   On: 08/14/2016 11:37   Ct Chest Wo Contrast  Result Date: 08/04/2016 CLINICAL DATA:  83 year old male with history of congestive heart failure. Large right pleural effusion. EXAM: CT CHEST WITHOUT CONTRAST TECHNIQUE: Multidetector CT imaging of the chest was performed following the standard protocol without IV contrast. COMPARISON:  No priors. FINDINGS: Cardiovascular: Heart size is mildly enlarged. Small amount of pericardial fluid and/or thickening, unlikely to be of hemodynamic significance at this time. There is aortic atherosclerosis, as well as atherosclerosis of the great vessels of the mediastinum and the coronary arteries, including calcified atherosclerotic plaque in the left main, left anterior descending, left circumflex and right coronary arteries. Mild calcifications of the aortic valve. Mild calcifications of the mitral annulus. Left-sided pacemaker device with lead tips terminating in the right atrial appendage and right ventricular apex. Mediastinum/Nodes: No pathologically enlarged mediastinal or hilar lymph nodes. Please note that accurate exclusion of hilar adenopathy is limited on noncontrast CT scans. Esophagus is unremarkable in appearance. No axillary lymphadenopathy. Lungs/Pleura: Large right  and moderate left-sided pleural effusions. This is associated with what appears to be passive atelectasis in the lungs. Specifically, the entire right lower lobe and right middle lobe are collapsed, and much of the dependent portions of the left lower lobe also appear collapse. There does appear to be  some patchy airspace consolidation, most evident in the periphery of the inferior segment of the lingula and in the remaining aerated portions of the left lower lobe where there are some air bronchograms, indicative of underlying pneumonia. Retained secretions are seen filling the bronchus intermedius largely occluding the bronchi to the right middle and lower lobes. Upper Abdomen: Aortic atherosclerosis. Trace volume of perihepatic ascites. Musculoskeletal: Old vertebral body compression fracture of T8 with approximately 80% loss of anterior vertebral body height. Old vertebral body compression fracture of T10 with approximately 10% loss of anterior vertebral body height. There are no aggressive appearing lytic or blastic lesions noted in the visualized portions of the skeleton. IMPRESSION: 1. Areas of airspace consolidation in the left lower lobe and inferior segment of the lingula, concerning for multilobar pneumonia. 2. Large right and moderate left-sided pleural effusions with associated extensive atelectasis in the entire right lower lobe and right middle lobe, and in the dependent portions of the left lower lobe. 3. Aortic atherosclerosis, in addition to left main and 3 vessel coronary artery disease. 4. There are calcifications of the aortic valve and mitral annulus. Echocardiographic correlation for evaluation of potential valvular dysfunction may be warranted if clinically indicated. 5. Trace amount of pericardial fluid and/or thickening, unlikely to be of any hemodynamic significance at this time. 6. Trace volume of perihepatic ascites. 7. Additional incidental findings, as above. Electronically Signed   By:  Trudie Reed M.D.   On: 08/04/2016 12:24   Dg Chest Port 1 View  Result Date: 08/14/2016 CLINICAL DATA:  Shortness of Breath EXAM: PORTABLE CHEST 1 VIEW COMPARISON:  08/13/2016 FINDINGS: Cardiac shadow remains enlarged. Pacing device is again seen and stable. Right-sided pleural effusion is noted which is increased in the interval from the prior exam. Right basilar atelectasis/infiltrate is noted as well. The left lung remains clear. No recurrent pneumothorax is seen. IMPRESSION: Slight increase in right-sided pleural effusion with basilar atelectasis/ infiltrate. No pneumothorax is noted. Electronically Signed   By: Alcide Clever M.D.   On: 08/14/2016 09:11   Dg Chest Port 1 View  Result Date: 08/13/2016 CLINICAL DATA:  Right chest tube removal.  Pneumothorax. EXAM: PORTABLE CHEST 1 VIEW COMPARISON:  08/13/2016 FINDINGS: Left pacer remains in place, unchanged. Interval removal of right chest tube. No visible pneumothorax. Cardiomegaly. Probable bilateral pleural effusions. Vascular congestion with diffuse right lung airspace disease and left basilar opacity, unchanged. IMPRESSION: No pneumothorax following right chest tube removal. Electronically Signed   By: Charlett Nose M.D.   On: 08/13/2016 14:56   Dg Chest Port 1 View  Result Date: 08/13/2016 CLINICAL DATA:  Follow-up right pneumothorax EXAM: PORTABLE CHEST 1 VIEW COMPARISON:  08/13/2016 FINDINGS: Cardiomediastinal silhouette is stable. Right chest tube is unchanged in position. No pneumothorax is noted. Persistent small bilateral pleural effusion with bilateral basilar atelectasis. Mild interstitial prominence bilaterally probable mild interstitial edema. IMPRESSION: Right chest tube is unchanged in position. No pneumothorax is noted. Persistent small bilateral pleural effusion with bilateral basilar atelectasis. Mild interstitial prominence bilaterally probable mild interstitial edema. Electronically Signed   By: Natasha Mead M.D.   On:  08/13/2016 11:23   Dg Chest Port 1 View  Result Date: 08/13/2016 CLINICAL DATA:  Continued surveillance pneumothorax. EXAM: PORTABLE CHEST 1 VIEW COMPARISON:  08/12/2016. FINDINGS: RIGHT lateral pneumothorax is increased, up to 5%. RIGHT chest tube unchanged. Cardiomegaly. Mild basilar opacities, likely representing edema, atelectasis, possible infiltrate. BILATERAL effusions. Unchanged pacemaker. Thoracic atherosclerosis. IMPRESSION: Mild increase RIGHT pneumothorax, up to 5%. Otherwise little  change aeration. Electronically Signed   By: Elsie Stain M.D.   On: 08/13/2016 07:22   Dg Chest Port 1 View  Result Date: 08/11/2016 CLINICAL DATA:  Pneumothorax. EXAM: PORTABLE CHEST 1 VIEW COMPARISON:  One-view chest x-ray 08/10/2016. FINDINGS: The heart is enlarged. Mild edema is stable. Pacing wires are stable. A right-sided chest tube remains in place. The previously seen pneumothorax is no longer present. Right hemidiaphragm is elevated. Asymmetric right lower lobe airspace disease is present. A right effusion is again noted. IMPRESSION: 1. Interval resolution of pneumothorax. Chest tube is stable in position. 2. Persistent asymmetric right lower lobe airspace disease and effusion. Electronically Signed   By: Marin Roberts M.D.   On: 08/11/2016 09:11   Dg Chest Port 1 View  Result Date: 08/10/2016 CLINICAL DATA:  Pneumothorax. EXAM: PORTABLE CHEST 1 VIEW COMPARISON:  One-view chest x-ray 08/09/2016 FINDINGS: The heart is enlarged. Left subclavian pacemaker wires are in place. A right pleural effusion and airspace disease is similar the prior exam. A small right-sided pneumothorax has increased. Right-sided chest tube remains in place. There is no left-sided pneumothorax. A small left pleural effusion is stable. IMPRESSION: 1. Re- emergence of small right-sided pneumothorax of 5-10% despite chest tube in place. 2. Large right pleural effusion and airspace disease on the right persists. Electronically  Signed   By: Marin Roberts M.D.   On: 08/10/2016 09:03   Dg Chest Port 1 View  Result Date: 08/09/2016 CLINICAL DATA:  83 y/o male with multilobar pneumonia and bilateral pleural effusions. Right pneumothorax following thoracentesis. Initial encounter. EXAM: PORTABLE CHEST 1 VIEW COMPARISON:  08/08/2016 and earlier. FINDINGS: Portable AP semi upright view at 0744 hours. Small caliber right chest tube remains in place. No residual right pneumothorax. Increased peripheral right lung opacity which may reflect loculated pleural fluid. Continued patchy and confluent right lower lung opacity. Continued confluent retrocardiac opacity. Stable cardiomegaly and mediastinal contours. Calcified aortic atherosclerosis. Stable left chest stool lead cardiac pacemaker. Visualized tracheal air column is within normal limits. IMPRESSION: 1. Stable right chest tube.  No residual pneumothorax. 2. Suspect some reaccumulation of partially loculated right pleural fluid. 3. Continued patchy right lung and dense retrocardiac opacity compatible with pneumonia. Electronically Signed   By: Odessa Fleming M.D.   On: 08/09/2016 08:03   Dg Chest Port 1 View  Result Date: 08/08/2016 CLINICAL DATA:  Chest tube placement for pneumothorax EXAM: PORTABLE CHEST 1 VIEW COMPARISON:  August 07, 2016 FINDINGS: A chest tube has been placed on the right. The right pneumothorax has largely resolved with only a minimal apical pneumothorax currently evident. No tension component. There is widespread interstitial edema with patchy airspace consolidation in both mid and lower lung zones. There are small pleural effusions bilaterally. There is cardiomegaly. The pulmonary vascularity is within normal limits. Pacemaker leads are attached to the right atrium and right ventricle. There is atherosclerotic calcification in the aorta. No bone lesions. IMPRESSION: Only minimal pneumothorax on the right following chest tube placement. There is interstitial and  alveolar edema bilaterally in a pattern suggesting congestive heart failure. There may be some re-expansion phenomenon on the right as well. There is questionable superimposed pneumonia in the bases, particular in the left lower lobe. Stable cardiomegaly. There is aortic atherosclerosis. Electronically Signed   By: Bretta Bang III M.D.   On: 08/08/2016 09:03   Dg Chest Port 1 View  Result Date: 08/07/2016 CLINICAL DATA:  Shortness of breath.  Known right-sided pneumothorax EXAM: PORTABLE CHEST 1 VIEW  COMPARISON:  August 06, 2016 FINDINGS: There is a pneumothorax on the right which appears overall slightly larger. No tension component. There is a airspace consolidation on the right with moderate right pleural effusion. Left lung is hyperexpanded. Atelectatic change in the left lower lobe is stable. There is cardiomegaly, stable. Pacemaker leads are attached to the right atrium and right ventricle. There is aortic atherosclerosis. There is degenerative change in each shoulder. IMPRESSION: The known right-sided pneumothorax is slightly larger but without tension component. There is persistent effusion on the right as well as consolidation throughout much of the aerated right lung. There is atelectatic change in the left base, stable. Lungs elsewhere clear. Stable cardiac enlargement. There is aortic atherosclerosis. These results will be called to the ordering clinician or representative by the Radiologist Assistant, and communication documented in the PACS or zVision Dashboard. Electronically Signed   By: Bretta Bang III M.D.   On: 08/07/2016 07:47   Dg Chest Port 1 View  Result Date: 08/06/2016 CLINICAL DATA:  Shortness of breath.  Pneumothorax . EXAM: PORTABLE CHEST 1 VIEW COMPARISON:  08/05/2016 . FINDINGS: Cardiac pacer stable position. Stable cardiomegaly. Increasing interstitial prominence consistent with CHF. Small bilateral pleural effusions. Right-sided pneumothorax is increased slightly  from prior exam. IMPRESSION: 1. Known right-sided pneumothorax has increased slightly from prior exam. 2. Cardiac pacer stable position. Congestive heart failure with slight increase in pulmonary interstitial edema. Bilateral small pleural effusions also noted. Electronically Signed   By: Maisie Fus  Register   On: 08/06/2016 07:07   Dg Chest Port 1 View  Result Date: 08/04/2016 CLINICAL DATA:  Severe shortness of breath since this morning, fever, hypertension, diabetes mellitus, COPD, paroxysmal atrial fibrillation EXAM: PORTABLE CHEST 1 VIEW COMPARISON:  Portable exam 0726 hours compared to 06/12/2016 FINDINGS: LEFT subclavian sequential pacemaker with leads projecting over RIGHT atrium and RIGHT ventricle unchanged. Enlargement of cardiac silhouette with pulmonary vascular congestion. Atherosclerotic calcification aorta. Moderate to large RIGHT pleural effusion with basilar atelectasis. BILATERAL interstitial infiltrates slightly increased question pulmonary edema though infection in RIGHT lung not excluded. No pneumothorax. Bones demineralized. IMPRESSION: Enlargement of cardiac silhouette post pacemaker with pulmonary vascular congestion and BILATERAL pulmonary infiltrates favor pulmonary edema as above. Moderate to large RIGHT pleural effusion. Aortic atherosclerosis. Electronically Signed   By: Ulyses Southward M.D.   On: 08/04/2016 07:37   Dg Chest Port 1v Same Day  Result Date: 08/12/2016 CLINICAL DATA:  Shortness of breath. EXAM: PORTABLE CHEST 1 VIEW COMPARISON:  08/11/2016 . FINDINGS: Right chest tube in stable position. No pneumothorax. Cardiac pacer with lead tips in right atrium and right ventricle cardiomegaly with diffuse bilateral pulmonary interstitial prominence and bilateral pleural effusions consistent with congestive heart failure. Slight worsening from prior exam . IMPRESSION: 1. Right chest tube in stable position.  No pneumothorax. 2. Cardiac pacer in stable position. Cardiomegaly with  slight worsening of pulmonary interstitial edema and bilateral pleural effusions. Electronically Signed   By: Maisie Fus  Register   On: 08/12/2016 07:30   Ct Image Guided Drainage By Percutaneous Catheter  Result Date: 08/07/2016 INDICATION: Respiratory insufficiency. Enlarging right hydropneumothorax post thoracentesis. EXAM: CT IMAGE GUIDED RIGHT CHEST DRAINAGE BY PERCUTANEOUS CATHETER MEDICATIONS: The patient is currently admitted to the hospital and receiving intravenous antibiotics. The antibiotics were administered within an appropriate time frame prior to the initiation of the procedure. ANESTHESIA/SEDATION: Intravenous Fentanyl and Versed were administered as conscious sedation during continuous monitoring of the patient's level of consciousness and physiological / cardiorespiratory status by the radiology RN,  with a total moderate sedation time of 22 minutes. COMPLICATIONS: None immediate. PROCEDURE: Informed written consent was obtained from the patient after a thorough discussion of the procedural risks, benefits and alternatives. All questions were addressed. Maximal Sterile Barrier Technique was utilized including caps, mask, sterile gowns, sterile gloves, sterile drape, hand hygiene and skin antiseptic. A timeout was performed prior to the initiation of the procedure. Select axial scans through the thorax were obtained. An appropriate skin entry site was determined and marked. Site was prepped and draped in usual sterile fashion. Maximal barrier sterile technique was utilized including mask, sterile gloves, sterile drape, hand hygiene and skin antiseptic. Under CT fluoroscopic guidance, a 19 gauge percutaneous entry needle was advanced into the right pleural space. Gas could be aspirated. An Amplatz guidewire advanced easily, position confirmed on CT. Tract dilated to facilitate placement of a 16 French Thal drain catheter, directed towards the apex anteriorly. CT confirmed appropriate catheter  position. Catheter secured externally with 0 Prolene suture and placed to a Pleur-Evac. The patient tolerated the procedure well. IMPRESSION: 1. Technically successful 16 French right chest tube placement for hydropneumothorax. Electronically Signed   By: Corlis Leak M.D.   On: 08/07/2016 17:42   US Thoracentesis Asp Pleural Space W/img Guide  Result Date: 08/05/2016 INDICATION: Acute Respiratory failure with hypoxia. Right pleural effusion. Request for diagnostic and therapeutic thoracentesis. EXAM: ULTRASOUND GUIDED RIGHT THORACENTESIS MEDICATIONS: 1% Lidocaine = 10 mL. COMPLICATIONS: None immediate. PROCEDURE: An ultrasound guided thoracentesis was thoroughly discussed with the patient and questions answered. The benefits, risks, alternatives and complications were also discussed. The patient understands and wishes to proceed with the procedure. Written consent was obtained. Ultrasound was performed to localize and mark an adequate pocket of fluid in the right chest. The area was then prepped and draped in the normal sterile fashion. 1% Lidocaine was used for local anesthesia. Under ultrasound guidance a 6 Fr Safe-T-Centesis catheter was introduced. Thoracentesis was performed. The catheter was removed and a dressing applied. FINDINGS: A total of approximately 1.7 liters of clear yellow fluid was removed. Samples were sent to the laboratory as requested by the clinical team. IMPRESSION: Successful ultrasound guided right thoracentesis yielding 1.7 liters of pleural fluid. Read by:  Corrin Parker, PA-C Electronically Signed   By: Richarda Overlie M.D.   On: 08/05/2016 14:09     PERTINENT LAB RESULTS: CBC:  Recent Labs  08/14/16 0432 08/14/16 0856  WBC 10.0 9.0  HGB 10.1* 10.5*  HCT 32.4* 33.0*  PLT 249 261   CMET CMP     Component Value Date/Time   NA 137 08/14/2016 0856   K 3.9 08/14/2016 0856   CL 94 (L) 08/14/2016 0856   CO2 32 08/14/2016 0856   GLUCOSE 239 (H) 08/14/2016 0856   BUN 33 (H)  08/14/2016 0856   CREATININE 1.33 (H) 08/14/2016 0856   CREATININE 0.92 09/11/2015 1312   CALCIUM 9.5 08/14/2016 0856   PROT 6.1 (L) 08/05/2016 1023   ALBUMIN 3.1 (L) 08/05/2016 1023   AST 14 (L) 08/05/2016 1023   ALT 13 (L) 08/05/2016 1023   ALKPHOS 60 08/05/2016 1023   BILITOT 1.1 08/05/2016 1023   GFRNONAA 46 (L) 08/14/2016 0856   GFRAA 53 (L) 08/14/2016 0856    GFR Estimated Creatinine Clearance: 40.2 mL/min (by C-G formula based on SCr of 1.33 mg/dL (H)). No results for input(s): LIPASE, AMYLASE in the last 72 hours. No results for input(s): CKTOTAL, CKMB, CKMBINDEX, TROPONINI in the last 72 hours. Invalid input(s):  POCBNP No results for input(s): DDIMER in the last 72 hours. No results for input(s): HGBA1C in the last 72 hours. No results for input(s): CHOL, HDL, LDLCALC, TRIG, CHOLHDL, LDLDIRECT in the last 72 hours. No results for input(s): TSH, T4TOTAL, T3FREE, THYROIDAB in the last 72 hours.  Invalid input(s): FREET3 No results for input(s): VITAMINB12, FOLATE, FERRITIN, TIBC, IRON, RETICCTPCT in the last 72 hours. Coags: No results for input(s): INR in the last 72 hours.  Invalid input(s): PT Microbiology: Recent Results (from the past 240 hour(s))  Culture, body fluid-bottle     Status: None   Collection Time: 08/05/16  1:48 PM  Result Value Ref Range Status   Specimen Description FLUID PLEURAL RIGHT  Final   Special Requests NONE  Final   Culture NO GROWTH 5 DAYS  Final   Report Status 08/10/2016 FINAL  Final  Gram stain     Status: None   Collection Time: 08/05/16  1:48 PM  Result Value Ref Range Status   Specimen Description FLUID  Final   Special Requests NONE  Final   Gram Stain   Final    WBC PRESENT, PREDOMINANTLY PMN NO ORGANISMS SEEN CYTOSPIN SMEAR    Report Status 08/05/2016 FINAL  Final  Culture, blood (routine x 2)     Status: None (Preliminary result)   Collection Time: 08/14/16 10:04 AM  Result Value Ref Range Status   Specimen  Description BLOOD RIGHT ANTECUBITAL  Final   Special Requests BOTTLES DRAWN AEROBIC ONLY 9CC  Final   Culture PENDING  Incomplete   Report Status PENDING  Incomplete    FURTHER DISCHARGE INSTRUCTIONS:  Get Medicines reviewed and adjusted: Please take all your medications with you for your next visit with your Primary MD  Laboratory/radiological data: Please request your Primary MD to go over all hospital tests and procedure/radiological results at the follow up, please ask your Primary MD to get all Hospital records sent to his/her office.  In some cases, they will be blood work, cultures and biopsy results pending at the time of your discharge. Please request that your primary care M.D. goes through all the records of your hospital data and follows up on these results.  Also Note the following: If you experience worsening of your admission symptoms, develop shortness of breath, life threatening emergency, suicidal or homicidal thoughts you must seek medical attention immediately by calling 911 or calling your MD immediately  if symptoms less severe.  You must read complete instructions/literature along with all the possible adverse reactions/side effects for all the Medicines you take and that have been prescribed to you. Take any new Medicines after you have completely understood and accpet all the possible adverse reactions/side effects.   Do not drive when taking Pain medications or sleeping medications (Benzodaizepines)  Do not take more than prescribed Pain, Sleep and Anxiety Medications. It is not advisable to combine anxiety,sleep and pain medications without talking with your primary care practitioner  Special Instructions: If you have smoked or chewed Tobacco  in the last 2 yrs please stop smoking, stop any regular Alcohol  and or any Recreational drug use.  Wear Seat belts while driving.  Please note: You were cared for by a hospitalist during your hospital stay. Once you are  discharged, your primary care physician will handle any further medical issues. Please note that NO REFILLS for any discharge medications will be authorized once you are discharged, as it is imperative that you return to your primary care physician (  or establish a relationship with a primary care physician if you do not have one) for your post hospital discharge needs so that they can reassess your need for medications and monitor your lab values.  Total Time spent coordinating discharge including counseling, education and face to face time equals 45 minutes.  SignedJeoffrey Massed 08/15/2016 11:33 AM

## 2016-08-15 NOTE — Plan of Care (Signed)
Problem: Health Behavior/Discharge Planning: Goal: Ability to manage health-related needs will improve Outcome: Adequate for Discharge Pt & family both aware of current plan. Pt plans to return to [facility] with hospice care. DNR already in place & MOST form filled out earlier today.

## 2016-08-15 NOTE — Plan of Care (Signed)
Problem: Physical Regulation: Goal: Ability to maintain clinical measurements within normal limits will improve Outcome: Adequate for Discharge Pt will go home on oral amoxicillin.

## 2016-08-16 DIAGNOSIS — I5030 Unspecified diastolic (congestive) heart failure: Secondary | ICD-10-CM | POA: Diagnosis not present

## 2016-08-16 DIAGNOSIS — E119 Type 2 diabetes mellitus without complications: Secondary | ICD-10-CM | POA: Diagnosis not present

## 2016-08-16 DIAGNOSIS — J449 Chronic obstructive pulmonary disease, unspecified: Secondary | ICD-10-CM | POA: Diagnosis not present

## 2016-08-16 DIAGNOSIS — I4891 Unspecified atrial fibrillation: Secondary | ICD-10-CM | POA: Diagnosis not present

## 2016-08-16 NOTE — Consult Note (Signed)
Consultation Note Date: 08/16/2016   Patient Name: Kyle [name]  DOB: 08/24/1925  MRN: [id number]  Age / Sex: 83 y.o., male  PCP: Marletta Lor, MD Referring Physician: Sloan Leiter Reason for Consultation: Establishing goals of care  HPI/Patient Profile: 83 y.o. male  with past medical history of atrial fibrillation, chronic anticoagulation, pulmonary hypertension, COPD admitted on 08/04/2016 with hypoxia.   Clinical Assessment and Goals of Care: I met today with patient and his daughter. We discussed clinical course as well as wishes moving forward in regard to advanced directives.  Concepts specific to code status and rehospitalization discussed.  We discussed difference between a aggressive medical intervention path and a palliative, comfort focused care path.  Values and goals of care important to patient and family were attempted to be elicited.  Concept of Hospice and Palliative Care were discussed.  Patient and family were clear in goal of getting back to [facility] with hospice support.  Questions and concerns addressed.   PMT will continue to support holistically  SUMMARY OF RECOMMENDATIONS   - Plan to transition back to [facility] with hospice support  Code Status/Advance Care Planning:  DNR  Palliative Prophylaxis:   Frequent Pain Assessment  Additional Recommendations (Limitations, Scope, Preferences):  Full Comfort Care  Psycho-social/Spiritual:   Desire for further Chaplaincy support:no  Additional Recommendations: Education on Hospice  Prognosis:   < 6 months  Discharge Planning: [facility] with Hospice      Primary Diagnoses: Present on Admission: . Atrial fibrillation ([place]) . Diabetes mellitus due to underlying condition with diabetic polyneuropathy ([place]) . Obstructive sleep apnea . Dyslipidemia . COPD with acute exacerbation ([place]) . Essential  hypertension . Acute respiratory failure with hypoxia ([place]) . Pleural effusion, right . Acute diastolic heart failure ([place]) . Pulmonary HTN   I have reviewed the medical record, interviewed the patient and family, and examined the patient. The following aspects are pertinent.  Past Medical History:  Diagnosis Date  . Benign prostatic hypertrophy   . CHF (congestive heart failure) ([place])   . COPD (chronic obstructive pulmonary disease) ([place])   . Diabetes mellitus   . Elevated PSA   . Glaucoma   . Hyperlipidemia   . Hypertension   . Intermittent complete heart block ([place])   . Pacemaker   . PAF (paroxysmal atrial fibrillation) ([facility])    Detected on pacemaker; the duration 44 seconds  . Trifascicular block    Social History   Social History  . Marital status: Married    Spouse name: N/A  . Number of children: 4  . Years of education: N/A   Social History Main Topics  . Smoking status: Former Smoker    Packs/day: 0.50    Years: 71.00    Types: Cigarettes    Quit date: 04/08/2016  . Smokeless tobacco: Never Used  . Alcohol use 1.8 oz/week    1 Glasses of wine, 2 Shots of liquor per week     Comment: bourbon, 2 drinks a day  . Drug use: No  .  Sexual activity: Not Asked   Other Topics Concern  . 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 Heart disease Mother     atrial fib  . Heart disease Father 6    MI  . Cancer Father     prostate   Scheduled Meds: Continuous Infusions: PRN Meds:. Medications Prior to Admission:  Prior to Admission medications   Medication Sig Start Date End Date Taking? Authorizing Provider  apixaban (ELIQUIS) 2.5 MG TABS tablet Take 1 tablet (2.5 mg total) by mouth 2 (two) times daily. 11/23/15  Yes  Sherran Needs, NP  doxazosin (CARDURA) 4 MG tablet TAKE 1 TABLET EACH DAY. 03/07/16  Yes Marletta Lor, MD  escitalopram (LEXAPRO) 10 MG tablet Take 10 mg by mouth daily.   Yes Historical Provider, MD  Fluticasone Propionate, Inhal, (FLOVENT DISKUS) 100 MCG/BLIST AEPB Inhale 1 puff into the lungs 2 (two) times daily.   Yes Historical Provider, MD  latanoprost (XALATAN) 0.005 % ophthalmic solution 1 drop at bedtime.   Yes Historical Provider, MD  lisinopril (PRINIVIL,ZESTRIL) 40 MG tablet TAKE 1 TABLET ONCE DAILY. 08/17/15  Yes Marletta Lor, MD  metFORMIN (GLUCOPHAGE-XR) 500 MG 24 hr tablet Take 1,500 mg by mouth daily with breakfast.   Yes Historical Provider, MD  metoprolol succinate (TOPROL-XL) 50 MG 24 hr tablet Take 1/2 tablet by mouth daily ('25mg'$ ) 11/02/15  Yes Sherran Needs, NP  albuterol (PROVENTIL) (2.5 MG/3ML) 0.083% nebulizer solution Take 3 mLs (2.5 mg total) by nebulization 3 (three) times daily. 08/15/16   Shanker Kristeen Mans, MD  albuterol (PROVENTIL) (2.5 MG/3ML) 0.083% nebulizer solution Take 3 mLs (2.5 mg total) by nebulization every 4 (four) hours as needed for wheezing or shortness of breath. 08/15/16   Shanker Kristeen Mans, MD  amoxicillin-clavulanate (AUGMENTIN) 875-125 MG tablet Take 1 tablet by mouth every 12 (twelve) hours. For 5 more days from 3/1 08/15/16   Shanker Kristeen Mans, MD  furosemide (LASIX) 40 MG tablet Take 1 tablet (40 mg total) by mouth 2 (two) times daily. 1 half tablet daily as needed for fluid control 08/15/16   Jonetta Osgood, MD  glucose blood (PRECISION XTRA TEST STRIPS) test strip 1 each by Other route daily as needed for other. Use as instructed 06/24/13   Marletta Lor, MD  haloperidol (HALDOL) 2 MG/ML solution Take 0.5 mLs (1 mg total) by mouth every 4 (four) hours as needed for agitation (or nausea). 08/15/16   Shanker Kristeen Mans, MD  LORazepam (ATIVAN) 2 MG/ML concentrated solution Take 0.3 mLs (0.6 mg total) by mouth every 4 (four) hours as needed  for anxiety. 08/15/16   Shanker Kristeen Mans, MD  Morphine Sulfate (MORPHINE CONCENTRATE) 10 MG/0.5ML SOLN concentrated solution Take 0.25 mLs (5 mg total) by mouth every 4 (four) hours as needed for anxiety or shortness of breath. 08/15/16   Shanker Kristeen Mans, MD  OLANZapine zydis (ZYPREXA) 5 MG disintegrating tablet Take 1 tablet (5 mg total) by mouth at bedtime. 08/15/16   Shanker Kristeen Mans, MD  polyethylene glycol (MIRALAX / GLYCOLAX) packet Take 17 g by mouth daily. 08/16/16   Shanker Kristeen Mans, MD   No Known Allergies Review of Systems Denies complaints  Physical Exam  General: Alert, awake, in no acute distress. Cachectic  HEENT: No bruits, no goiter, no JVD Heart: Regular rate and rhythm. No murmur appreciated. Lungs: Good air movement, clear Abdomen: Soft, nontender, nondistended, positive bowel sounds.  Ext: No significant edema Skin: Warm and dry Neuro: Grossly intact, nonfocal.  Vital Signs: BP (!) 103/53 (BP Location: Left Arm)   Pulse 69   Temp 98.7 F (37.1 C) (Oral)   Resp (!) 25   Ht 5' 10.5" (1.791 m)   Wt 74.1 kg (163 lb 4.8 oz)   SpO2 100%   BMI 23.10 kg/m  Pain Assessment: No/denies pain (just uncomfortable)   Pain Score: Asleep   SpO2: SpO2: 100 % O2 Device:SpO2: 100 % O2 Flow Rate: .O2 Flow Rate (L/min): 2 L/min  IO: Intake/output summary: No intake or output data in the 24 hours ending 08/16/16 2209  LBM: Last BM Date: 08/14/16 Baseline Weight: Weight: 73.9 kg (163 lb) Most recent weight: Weight: 74.1 kg (163 lb 4.8 oz)     Palliative Assessment/Data:   Flowsheet Rows   Flowsheet Row Most Recent Value  Intake Tab  Referral Department  Hospitalist  Unit at Time of Referral  Cardiac/Telemetry Unit  Palliative Care Primary Diagnosis  Pulmonary  Date Notified  08/14/16  Palliative Care Type  New Palliative care  Reason for referral  Clarify Goals of Care  Date of Admission  08/04/16  Date first seen by Palliative Care  08/15/16  # of days Palliative  referral response time  1 Day(s)  # of days IP prior to Palliative referral  10  Clinical Assessment  Psychosocial & Spiritual Assessment  Palliative Care Outcomes  Actual Discharge Date  08/15/16 [SNF [facility]) with hospice]      Time In: 0940 Time Out: 1055 Time Total: 75 Greater than 50%  of this time was spent counseling and coordinating care related to the above assessment and plan.  Signed by: Micheline Rough, MD   Please contact Palliative Medicine Team phone at [phone] for questions and concerns.  For individual provider: See Shea Evans

## 2016-08-19 LAB — CULTURE, BLOOD (ROUTINE X 2)
CULTURE: NO GROWTH
Culture: NO GROWTH

## 2016-09-17 ENCOUNTER — Ambulatory Visit: Payer: Medicare Other | Admitting: Internal Medicine

## 2016-09-18 DIAGNOSIS — D649 Anemia, unspecified: Secondary | ICD-10-CM | POA: Diagnosis not present

## 2016-09-18 DIAGNOSIS — E119 Type 2 diabetes mellitus without complications: Secondary | ICD-10-CM | POA: Diagnosis not present

## 2016-09-24 ENCOUNTER — Other Ambulatory Visit: Payer: Self-pay

## 2016-09-24 NOTE — Patient Outreach (Signed)
Pt is in a long term care facility

## 2016-09-25 DIAGNOSIS — J449 Chronic obstructive pulmonary disease, unspecified: Secondary | ICD-10-CM | POA: Diagnosis not present

## 2016-09-25 DIAGNOSIS — E119 Type 2 diabetes mellitus without complications: Secondary | ICD-10-CM | POA: Diagnosis not present

## 2016-09-25 DIAGNOSIS — I5030 Unspecified diastolic (congestive) heart failure: Secondary | ICD-10-CM | POA: Diagnosis not present

## 2016-09-25 DIAGNOSIS — I480 Paroxysmal atrial fibrillation: Secondary | ICD-10-CM | POA: Diagnosis not present

## 2016-09-27 DIAGNOSIS — M19022 Primary osteoarthritis, left elbow: Secondary | ICD-10-CM | POA: Diagnosis not present

## 2016-09-30 ENCOUNTER — Encounter: Payer: Medicare Other | Admitting: *Deleted

## 2016-09-30 ENCOUNTER — Telehealth: Payer: Self-pay | Admitting: Cardiology

## 2016-09-30 NOTE — Telephone Encounter (Signed)
LMOVM reminding pt to send remote transmission.   

## 2016-10-04 ENCOUNTER — Encounter: Payer: Self-pay | Admitting: Cardiology

## 2016-10-15 DEATH — deceased

## 2016-11-15 DEATH — deceased

## 2017-04-07 IMAGING — CT CT IMAGE GUIDED DRAINAGE BY PERCUTANEOUS CATHETER
1 of 3 series · 14 of 32 positions shown, 19 images · non-contrast
Comparison: none

INDICATION: Respiratory insufficiency. Enlarging right hydropneumothorax post
thoracentesis.

[Series 2: i-spiral 5.0 b40f · axial · 0.73mm/px · z∈[+1326,+1564]mm · 14 of 78 slices shown, 19 images]
[im 5/78  soft-tissue]
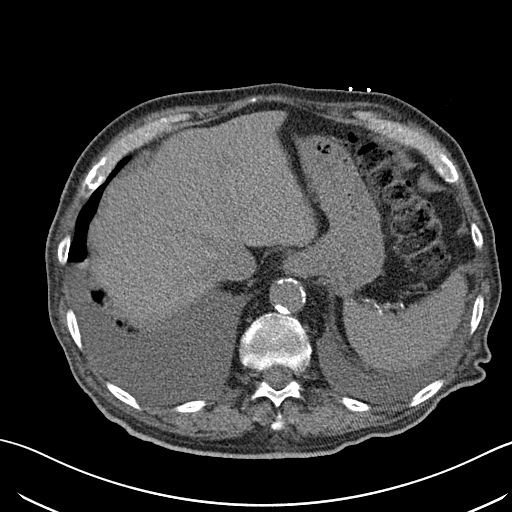
[im 5/78  bone]
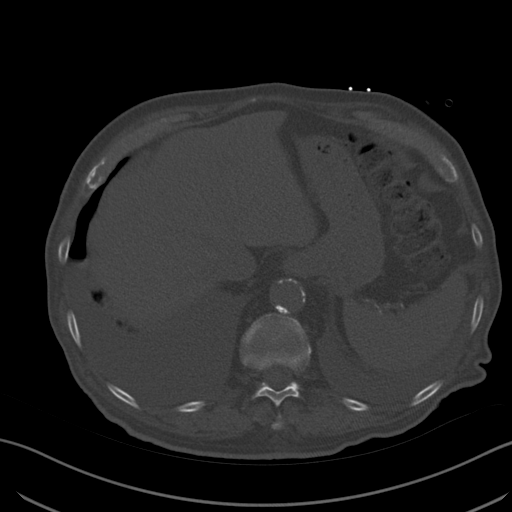
[im 10/78  soft-tissue]
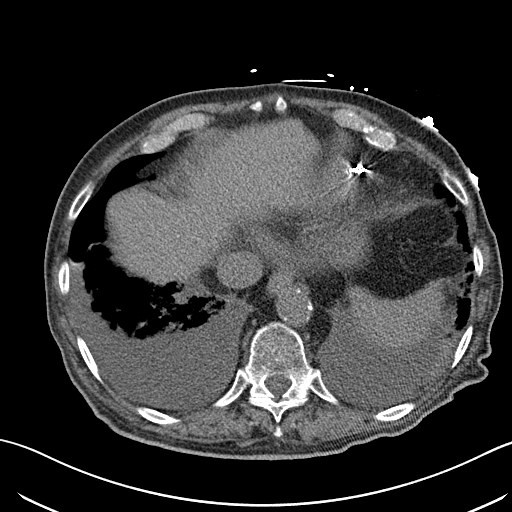
[im 15/78  soft-tissue]
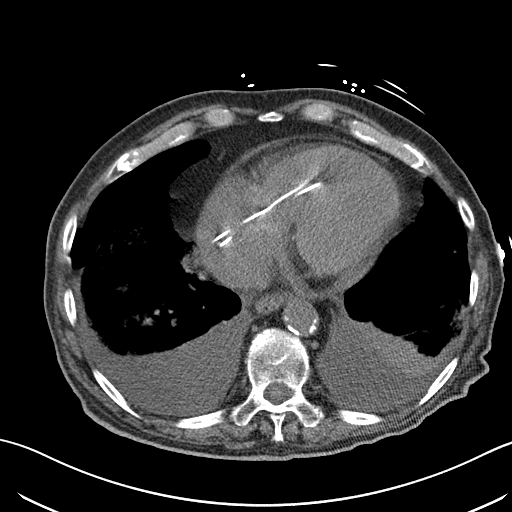
[im 25/78  soft-tissue]
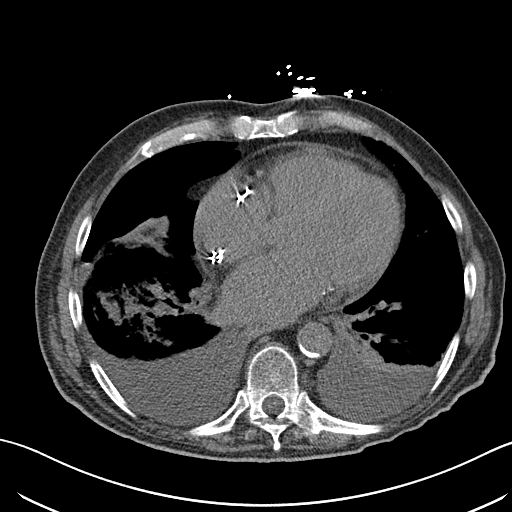
[im 29/78  soft-tissue]
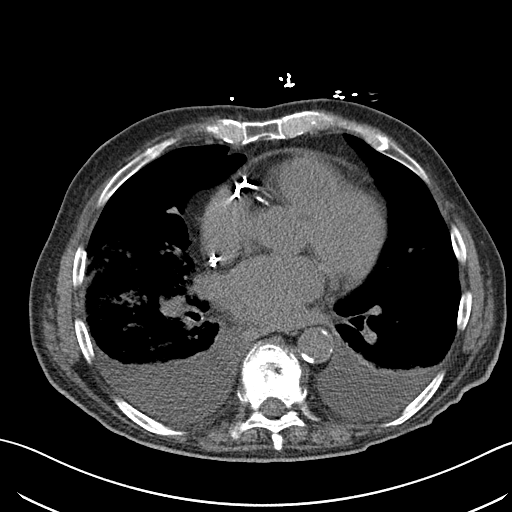
[im 34/78  soft-tissue]
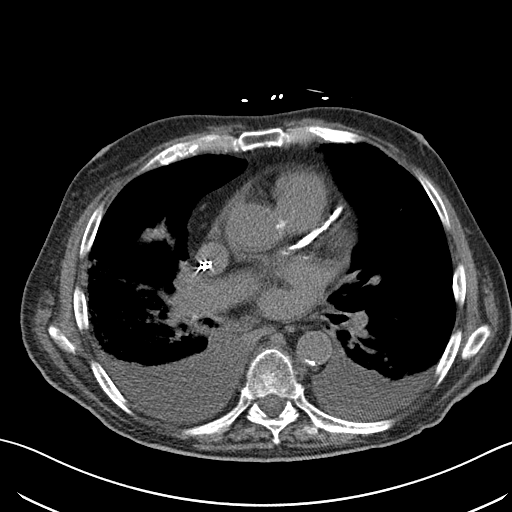
[im 39/78  soft-tissue]
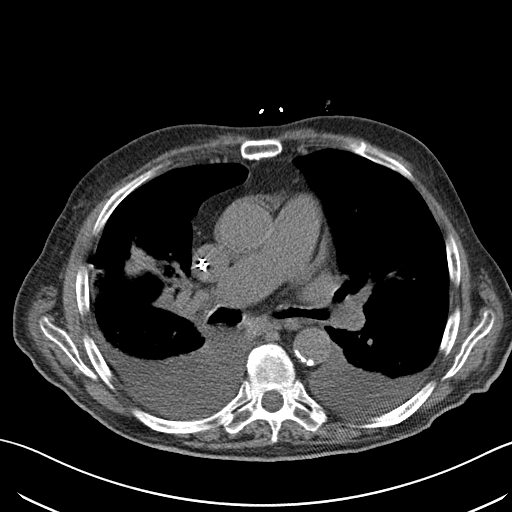
[im 44/78  soft-tissue]
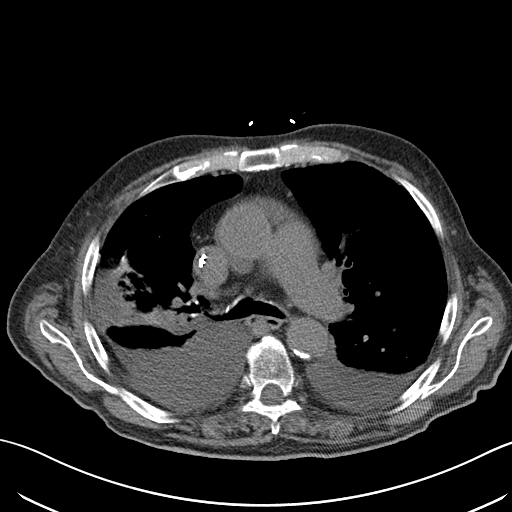
[im 49/78  soft-tissue]
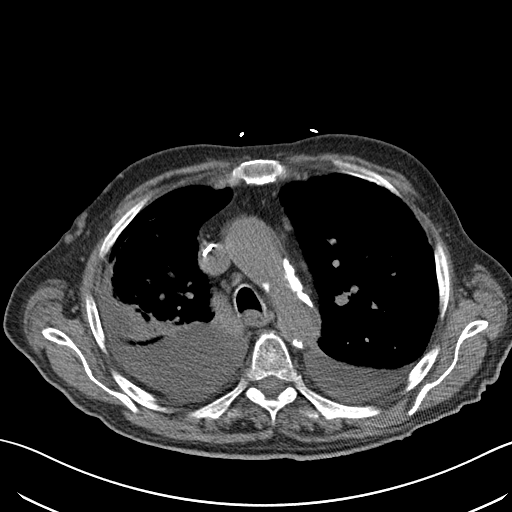
[im 49/78  bone]
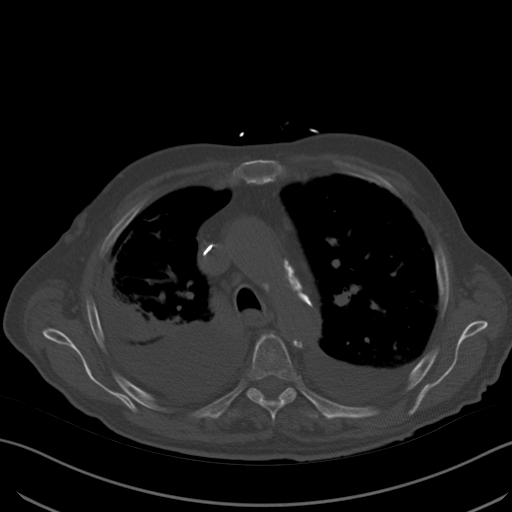
[im 53/78  soft-tissue]
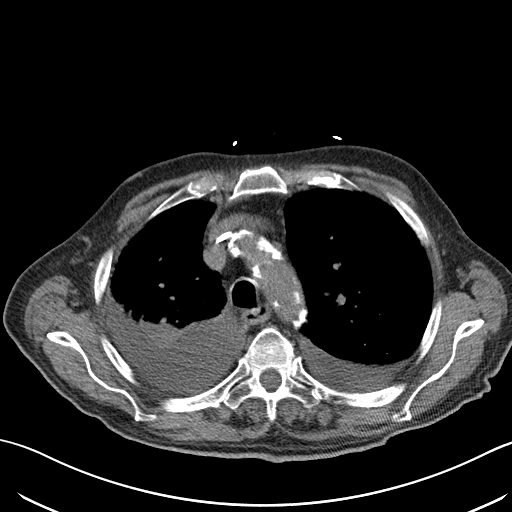
[im 58/78  lung]
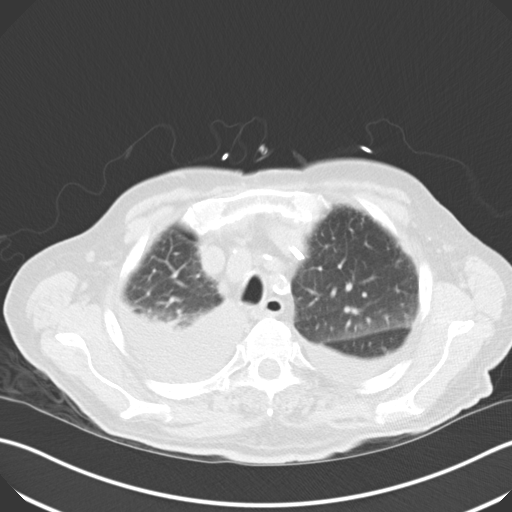
[im 63/78  soft-tissue]
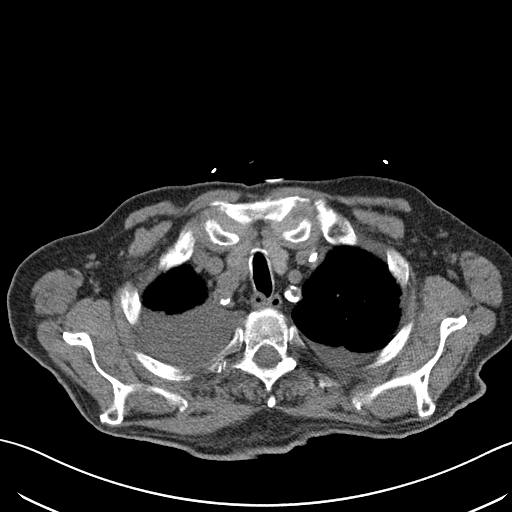
[im 63/78  lung]
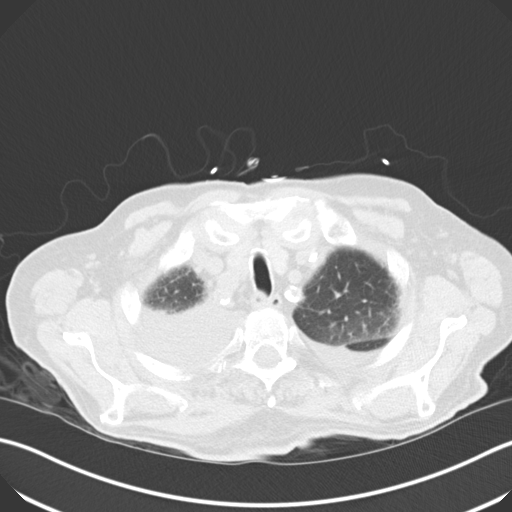
[im 68/78  soft-tissue]
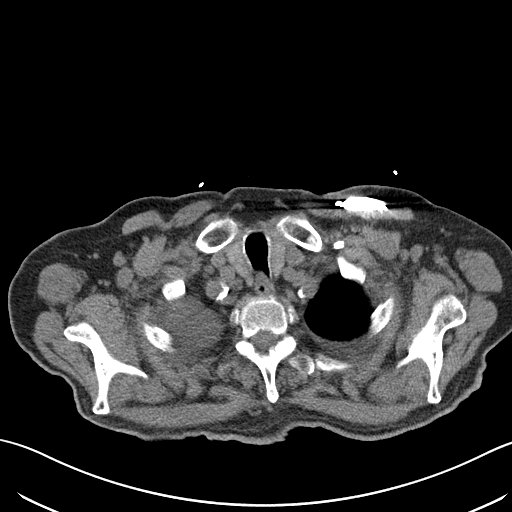
[im 68/78  lung]
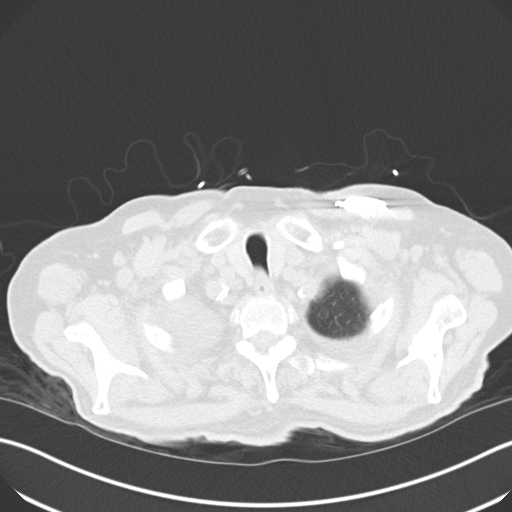
[im 73/78  soft-tissue]
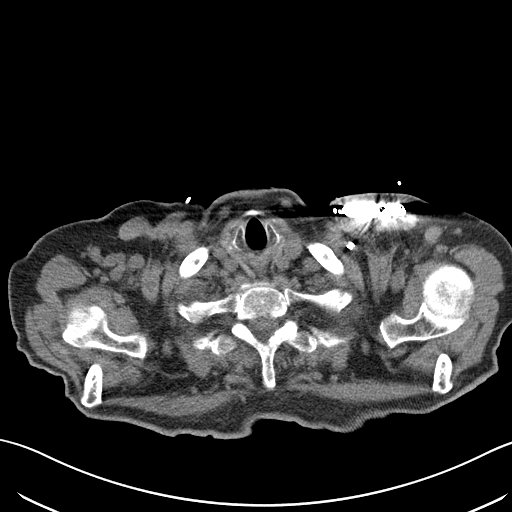
[im 73/78  lung]
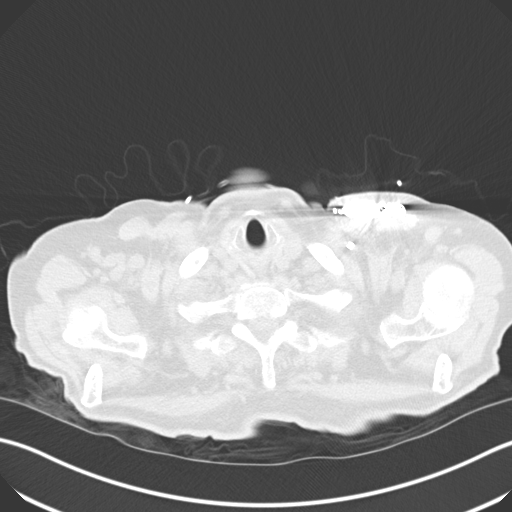

[14 of 32 positions shown; findings below may reference images not displayed]

EXAM:
CT IMAGE GUIDED RIGHT CHEST DRAINAGE BY PERCUTANEOUS CATHETER

MEDICATIONS:
The patient is currently admitted to the hospital and receiving
intravenous antibiotics. The antibiotics were administered within an
appropriate time frame prior to the initiation of the procedure.

ANESTHESIA/SEDATION:
Intravenous Fentanyl and Versed were administered as conscious
sedation during continuous monitoring of the patient's level of
consciousness and physiological / cardiorespiratory status by the
radiology RN, with a total moderate sedation time of 22 minutes.

COMPLICATIONS:
None immediate.

PROCEDURE:
Informed written consent was obtained from the patient after a
thorough discussion of the procedural risks, benefits and
alternatives. All questions were addressed. Maximal Sterile Barrier
Technique was utilized including caps, mask, sterile gowns, sterile
gloves, sterile drape, hand hygiene and skin antiseptic. A timeout
was performed prior to the initiation of the procedure.

Select axial scans through the thorax were obtained. An appropriate
skin entry site was determined and marked. Site was prepped and
draped in usual sterile fashion. Maximal barrier sterile technique
was utilized including mask, sterile gloves, sterile drape, hand
hygiene and skin antiseptic.

Under CT fluoroscopic guidance, a 19 gauge percutaneous entry needle
was advanced into the right pleural space. Gas could be aspirated.
An Amplatz guidewire advanced easily, position confirmed on CT.
Tract dilated to facilitate placement of a 16 Evangelina Arnett
catheter, directed towards the apex anteriorly. CT confirmed
appropriate catheter position. Catheter secured externally with 0
Prolene suture and placed to a Pleur-Evac. The patient tolerated the
procedure well.
IMPRESSION: 1. Technically successful 16 French right chest tube placement for
hydropneumothorax.

## 2017-04-08 IMAGING — CR DG CHEST 1V PORT
1 series · 1 of 1 positions shown · non-contrast
Comparison: August 07, 2016

CLINICAL DATA: Chest tube placement for pneumothorax

EXAM:
PORTABLE CHEST 1 VIEW

[AP]
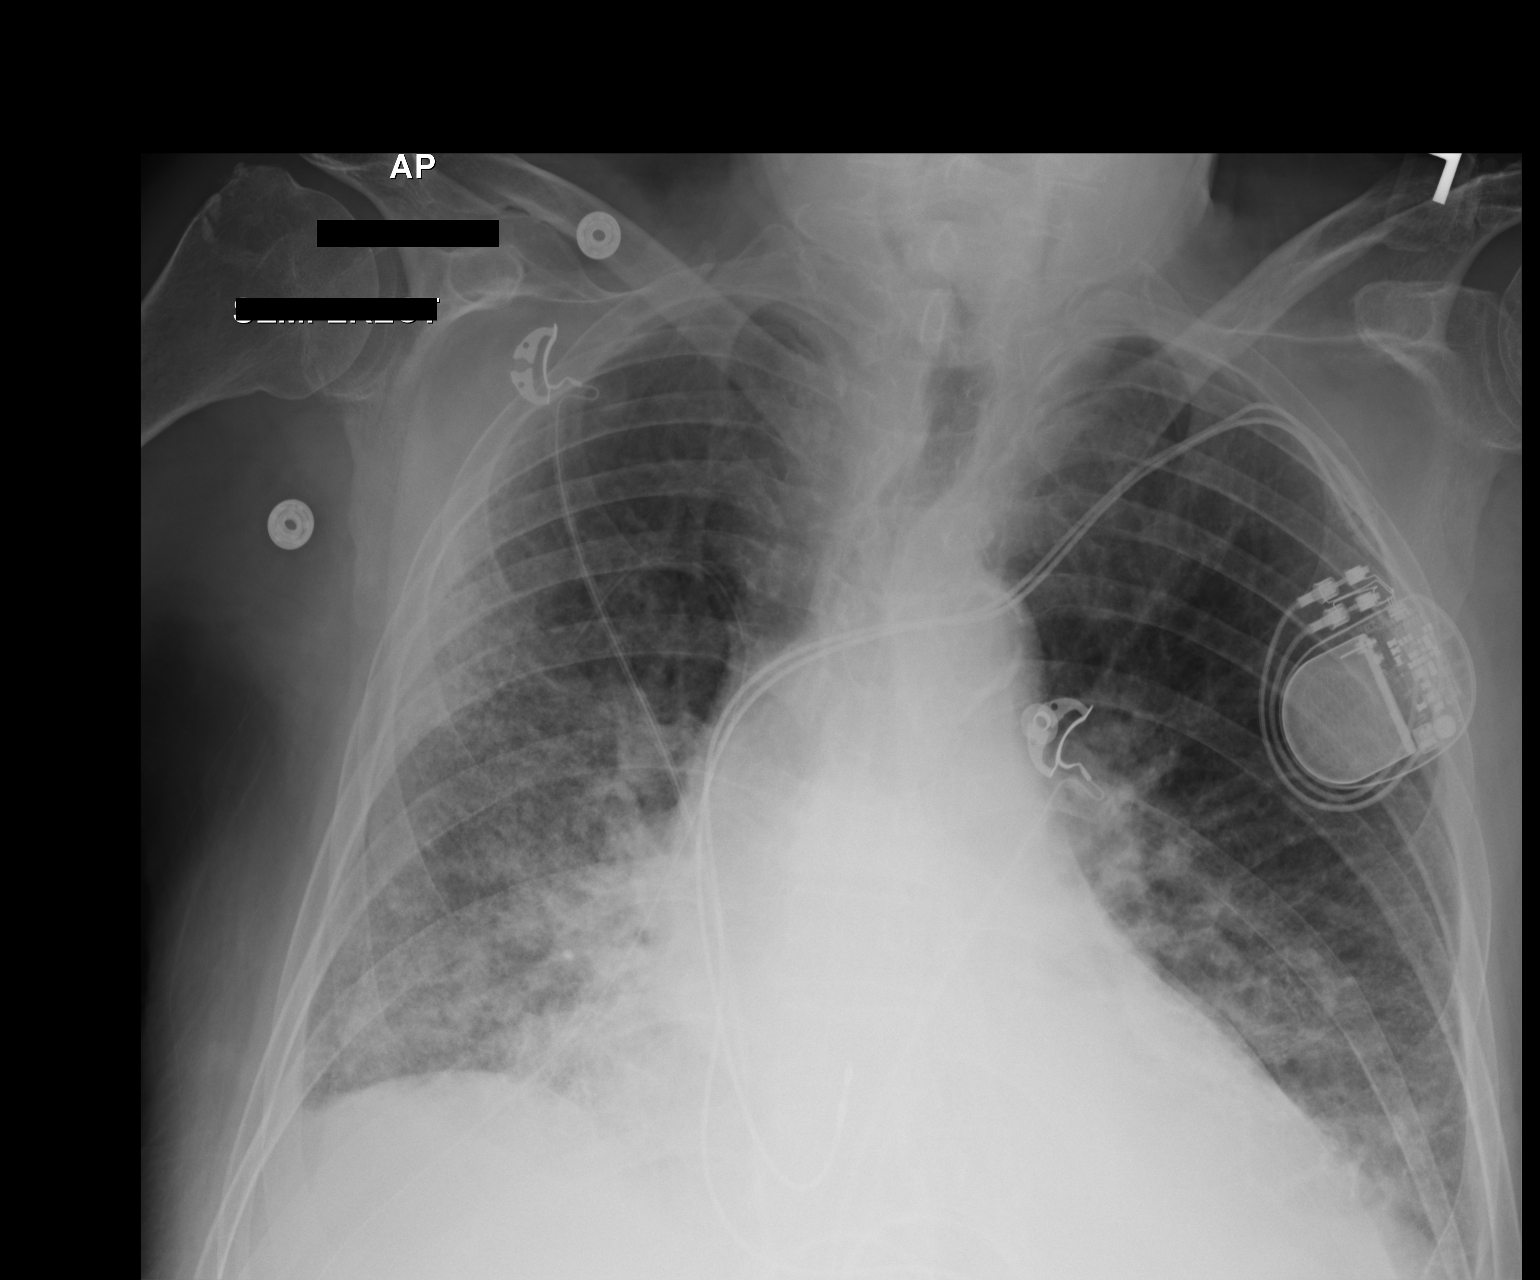

[1 of 1 positions shown; findings below may reference images not displayed]

FINDINGS: A chest tube has been placed on the right. The right pneumothorax
has largely resolved with only a minimal apical pneumothorax
currently evident. No tension component.

There is widespread interstitial edema with patchy airspace
consolidation in both mid and lower lung zones. There are small
pleural effusions bilaterally. There is cardiomegaly. The pulmonary
vascularity is within normal limits. Pacemaker leads are attached to
the right atrium and right ventricle. There is atherosclerotic
calcification in the aorta. No bone lesions.
IMPRESSION: Only minimal pneumothorax on the right following chest tube
placement. There is interstitial and alveolar edema bilaterally in a
pattern suggesting congestive heart failure. There may be some
re-expansion phenomenon on the right as well. There is questionable
superimposed pneumonia in the bases, particular in the left lower
lobe. Stable cardiomegaly. There is aortic atherosclerosis.
